# Patient Record
Sex: Female | Born: 1940 | Race: White | Hispanic: No | Marital: Married | Smoking: Never smoker
Health system: Southern US, Community
[De-identification: ages and names within clinical notes are randomized; demographics above are authoritative.]

## PROBLEM LIST (undated history)

## (undated) DIAGNOSIS — K5790 Diverticulosis of intestine, part unspecified, without perforation or abscess without bleeding: Secondary | ICD-10-CM

## (undated) DIAGNOSIS — E785 Hyperlipidemia, unspecified: Secondary | ICD-10-CM

## (undated) DIAGNOSIS — I10 Essential (primary) hypertension: Secondary | ICD-10-CM

## (undated) DIAGNOSIS — E119 Type 2 diabetes mellitus without complications: Secondary | ICD-10-CM

## (undated) DIAGNOSIS — T8859XA Other complications of anesthesia, initial encounter: Secondary | ICD-10-CM

## (undated) DIAGNOSIS — D649 Anemia, unspecified: Secondary | ICD-10-CM

## (undated) DIAGNOSIS — I2699 Other pulmonary embolism without acute cor pulmonale: Secondary | ICD-10-CM

## (undated) DIAGNOSIS — I700 Atherosclerosis of aorta: Secondary | ICD-10-CM

## (undated) DIAGNOSIS — U071 COVID-19: Secondary | ICD-10-CM

## (undated) DIAGNOSIS — K219 Gastro-esophageal reflux disease without esophagitis: Secondary | ICD-10-CM

## (undated) HISTORY — DX: Other pulmonary embolism without acute cor pulmonale: I26.99

## (undated) HISTORY — DX: Atherosclerosis of aorta: I70.0

## (undated) HISTORY — DX: Gastro-esophageal reflux disease without esophagitis: K21.9

## (undated) HISTORY — DX: COVID-19: U07.1

## (undated) HISTORY — DX: Diverticulosis of intestine, part unspecified, without perforation or abscess without bleeding: K57.90

## (undated) HISTORY — PX: COLONOSCOPY: SHX174

## (undated) HISTORY — DX: Hyperlipidemia, unspecified: E78.5

## (undated) HISTORY — PX: TONSILLECTOMY: SUR1361

---

## 2008-08-19 ENCOUNTER — Emergency Department (HOSPITAL_COMMUNITY): Admission: EM | Admit: 2008-08-19 | Discharge: 2008-08-19 | Payer: Self-pay | Admitting: Emergency Medicine

## 2010-10-11 LAB — CBC
HCT: 43.1 % (ref 36.0–46.0)
Hemoglobin: 14.7 g/dL (ref 12.0–15.0)
MCV: 87.1 fL (ref 78.0–100.0)
RBC: 4.94 MIL/uL (ref 3.87–5.11)
WBC: 10.8 10*3/uL — ABNORMAL HIGH (ref 4.0–10.5)

## 2010-10-11 LAB — URINALYSIS, ROUTINE W REFLEX MICROSCOPIC
Bilirubin Urine: NEGATIVE
Hgb urine dipstick: NEGATIVE
Ketones, ur: NEGATIVE mg/dL
Protein, ur: NEGATIVE mg/dL
Urobilinogen, UA: 0.2 mg/dL (ref 0.0–1.0)

## 2010-10-11 LAB — POCT I-STAT, CHEM 8
BUN: 20 mg/dL (ref 6–23)
Creatinine, Ser: 0.9 mg/dL (ref 0.4–1.2)
Sodium: 141 mEq/L (ref 135–145)
TCO2: 25 mmol/L (ref 0–100)

## 2010-10-11 LAB — DIFFERENTIAL
Eosinophils Absolute: 0.3 10*3/uL (ref 0.0–0.7)
Eosinophils Relative: 3 % (ref 0–5)
Lymphs Abs: 4.2 10*3/uL — ABNORMAL HIGH (ref 0.7–4.0)
Monocytes Relative: 10 % (ref 3–12)
Neutrophils Relative %: 49 % (ref 43–77)

## 2010-10-11 LAB — POCT CARDIAC MARKERS: Myoglobin, poc: 76.9 ng/mL (ref 12–200)

## 2014-07-29 DIAGNOSIS — Z1231 Encounter for screening mammogram for malignant neoplasm of breast: Secondary | ICD-10-CM | POA: Diagnosis not present

## 2014-08-10 DIAGNOSIS — E119 Type 2 diabetes mellitus without complications: Secondary | ICD-10-CM | POA: Diagnosis not present

## 2014-08-10 DIAGNOSIS — E782 Mixed hyperlipidemia: Secondary | ICD-10-CM | POA: Diagnosis not present

## 2014-08-10 DIAGNOSIS — E559 Vitamin D deficiency, unspecified: Secondary | ICD-10-CM | POA: Diagnosis not present

## 2014-08-18 DIAGNOSIS — E119 Type 2 diabetes mellitus without complications: Secondary | ICD-10-CM | POA: Diagnosis not present

## 2014-08-18 DIAGNOSIS — Z23 Encounter for immunization: Secondary | ICD-10-CM | POA: Diagnosis not present

## 2014-09-01 DIAGNOSIS — H25813 Combined forms of age-related cataract, bilateral: Secondary | ICD-10-CM | POA: Diagnosis not present

## 2014-09-01 DIAGNOSIS — H3531 Nonexudative age-related macular degeneration: Secondary | ICD-10-CM | POA: Diagnosis not present

## 2014-09-01 DIAGNOSIS — H35363 Drusen (degenerative) of macula, bilateral: Secondary | ICD-10-CM | POA: Diagnosis not present

## 2014-09-17 DIAGNOSIS — J0100 Acute maxillary sinusitis, unspecified: Secondary | ICD-10-CM | POA: Diagnosis not present

## 2014-11-26 DIAGNOSIS — M25552 Pain in left hip: Secondary | ICD-10-CM | POA: Diagnosis not present

## 2014-11-27 DIAGNOSIS — E780 Pure hypercholesterolemia: Secondary | ICD-10-CM | POA: Diagnosis not present

## 2014-11-27 DIAGNOSIS — K5790 Diverticulosis of intestine, part unspecified, without perforation or abscess without bleeding: Secondary | ICD-10-CM | POA: Diagnosis not present

## 2014-11-27 DIAGNOSIS — R079 Chest pain, unspecified: Secondary | ICD-10-CM | POA: Diagnosis not present

## 2014-11-27 DIAGNOSIS — I10 Essential (primary) hypertension: Secondary | ICD-10-CM | POA: Diagnosis not present

## 2014-12-14 DIAGNOSIS — M25552 Pain in left hip: Secondary | ICD-10-CM | POA: Diagnosis not present

## 2014-12-14 DIAGNOSIS — M5416 Radiculopathy, lumbar region: Secondary | ICD-10-CM | POA: Diagnosis not present

## 2014-12-22 DIAGNOSIS — R262 Difficulty in walking, not elsewhere classified: Secondary | ICD-10-CM | POA: Diagnosis not present

## 2014-12-22 DIAGNOSIS — M545 Low back pain: Secondary | ICD-10-CM | POA: Diagnosis not present

## 2014-12-25 DIAGNOSIS — R262 Difficulty in walking, not elsewhere classified: Secondary | ICD-10-CM | POA: Diagnosis not present

## 2014-12-25 DIAGNOSIS — M62552 Muscle wasting and atrophy, not elsewhere classified, left thigh: Secondary | ICD-10-CM | POA: Diagnosis not present

## 2014-12-25 DIAGNOSIS — M545 Low back pain: Secondary | ICD-10-CM | POA: Diagnosis not present

## 2014-12-25 DIAGNOSIS — M62551 Muscle wasting and atrophy, not elsewhere classified, right thigh: Secondary | ICD-10-CM | POA: Diagnosis not present

## 2014-12-27 DIAGNOSIS — K5790 Diverticulosis of intestine, part unspecified, without perforation or abscess without bleeding: Secondary | ICD-10-CM | POA: Diagnosis not present

## 2014-12-27 DIAGNOSIS — E569 Vitamin deficiency, unspecified: Secondary | ICD-10-CM | POA: Diagnosis not present

## 2014-12-27 DIAGNOSIS — E780 Pure hypercholesterolemia: Secondary | ICD-10-CM | POA: Diagnosis not present

## 2014-12-27 DIAGNOSIS — R079 Chest pain, unspecified: Secondary | ICD-10-CM | POA: Diagnosis not present

## 2014-12-30 DIAGNOSIS — R262 Difficulty in walking, not elsewhere classified: Secondary | ICD-10-CM | POA: Diagnosis not present

## 2014-12-30 DIAGNOSIS — M62552 Muscle wasting and atrophy, not elsewhere classified, left thigh: Secondary | ICD-10-CM | POA: Diagnosis not present

## 2014-12-30 DIAGNOSIS — M62551 Muscle wasting and atrophy, not elsewhere classified, right thigh: Secondary | ICD-10-CM | POA: Diagnosis not present

## 2014-12-30 DIAGNOSIS — M545 Low back pain: Secondary | ICD-10-CM | POA: Diagnosis not present

## 2015-01-01 DIAGNOSIS — M62552 Muscle wasting and atrophy, not elsewhere classified, left thigh: Secondary | ICD-10-CM | POA: Diagnosis not present

## 2015-01-01 DIAGNOSIS — M545 Low back pain: Secondary | ICD-10-CM | POA: Diagnosis not present

## 2015-01-01 DIAGNOSIS — M62551 Muscle wasting and atrophy, not elsewhere classified, right thigh: Secondary | ICD-10-CM | POA: Diagnosis not present

## 2015-01-01 DIAGNOSIS — R262 Difficulty in walking, not elsewhere classified: Secondary | ICD-10-CM | POA: Diagnosis not present

## 2015-01-04 DIAGNOSIS — M62551 Muscle wasting and atrophy, not elsewhere classified, right thigh: Secondary | ICD-10-CM | POA: Diagnosis not present

## 2015-01-04 DIAGNOSIS — M545 Low back pain: Secondary | ICD-10-CM | POA: Diagnosis not present

## 2015-01-04 DIAGNOSIS — R262 Difficulty in walking, not elsewhere classified: Secondary | ICD-10-CM | POA: Diagnosis not present

## 2015-01-04 DIAGNOSIS — M62552 Muscle wasting and atrophy, not elsewhere classified, left thigh: Secondary | ICD-10-CM | POA: Diagnosis not present

## 2015-01-08 DIAGNOSIS — R262 Difficulty in walking, not elsewhere classified: Secondary | ICD-10-CM | POA: Diagnosis not present

## 2015-01-08 DIAGNOSIS — M545 Low back pain: Secondary | ICD-10-CM | POA: Diagnosis not present

## 2015-01-08 DIAGNOSIS — M62551 Muscle wasting and atrophy, not elsewhere classified, right thigh: Secondary | ICD-10-CM | POA: Diagnosis not present

## 2015-01-08 DIAGNOSIS — M62552 Muscle wasting and atrophy, not elsewhere classified, left thigh: Secondary | ICD-10-CM | POA: Diagnosis not present

## 2015-01-11 DIAGNOSIS — M62551 Muscle wasting and atrophy, not elsewhere classified, right thigh: Secondary | ICD-10-CM | POA: Diagnosis not present

## 2015-01-11 DIAGNOSIS — R262 Difficulty in walking, not elsewhere classified: Secondary | ICD-10-CM | POA: Diagnosis not present

## 2015-01-11 DIAGNOSIS — M62552 Muscle wasting and atrophy, not elsewhere classified, left thigh: Secondary | ICD-10-CM | POA: Diagnosis not present

## 2015-01-11 DIAGNOSIS — M545 Low back pain: Secondary | ICD-10-CM | POA: Diagnosis not present

## 2015-01-13 DIAGNOSIS — M62552 Muscle wasting and atrophy, not elsewhere classified, left thigh: Secondary | ICD-10-CM | POA: Diagnosis not present

## 2015-01-13 DIAGNOSIS — M545 Low back pain: Secondary | ICD-10-CM | POA: Diagnosis not present

## 2015-01-13 DIAGNOSIS — R262 Difficulty in walking, not elsewhere classified: Secondary | ICD-10-CM | POA: Diagnosis not present

## 2015-01-13 DIAGNOSIS — M62551 Muscle wasting and atrophy, not elsewhere classified, right thigh: Secondary | ICD-10-CM | POA: Diagnosis not present

## 2015-01-20 DIAGNOSIS — M545 Low back pain: Secondary | ICD-10-CM | POA: Diagnosis not present

## 2015-01-20 DIAGNOSIS — M62551 Muscle wasting and atrophy, not elsewhere classified, right thigh: Secondary | ICD-10-CM | POA: Diagnosis not present

## 2015-01-20 DIAGNOSIS — M62552 Muscle wasting and atrophy, not elsewhere classified, left thigh: Secondary | ICD-10-CM | POA: Diagnosis not present

## 2015-01-20 DIAGNOSIS — R262 Difficulty in walking, not elsewhere classified: Secondary | ICD-10-CM | POA: Diagnosis not present

## 2015-01-27 DIAGNOSIS — R262 Difficulty in walking, not elsewhere classified: Secondary | ICD-10-CM | POA: Diagnosis not present

## 2015-01-27 DIAGNOSIS — M62551 Muscle wasting and atrophy, not elsewhere classified, right thigh: Secondary | ICD-10-CM | POA: Diagnosis not present

## 2015-01-27 DIAGNOSIS — E569 Vitamin deficiency, unspecified: Secondary | ICD-10-CM | POA: Diagnosis not present

## 2015-01-27 DIAGNOSIS — M62552 Muscle wasting and atrophy, not elsewhere classified, left thigh: Secondary | ICD-10-CM | POA: Diagnosis not present

## 2015-01-27 DIAGNOSIS — E782 Mixed hyperlipidemia: Secondary | ICD-10-CM | POA: Diagnosis not present

## 2015-01-27 DIAGNOSIS — R079 Chest pain, unspecified: Secondary | ICD-10-CM | POA: Diagnosis not present

## 2015-01-27 DIAGNOSIS — M545 Low back pain: Secondary | ICD-10-CM | POA: Diagnosis not present

## 2015-01-27 DIAGNOSIS — K5790 Diverticulosis of intestine, part unspecified, without perforation or abscess without bleeding: Secondary | ICD-10-CM | POA: Diagnosis not present

## 2015-02-10 DIAGNOSIS — M545 Low back pain: Secondary | ICD-10-CM | POA: Diagnosis not present

## 2015-02-10 DIAGNOSIS — E119 Type 2 diabetes mellitus without complications: Secondary | ICD-10-CM | POA: Diagnosis not present

## 2015-02-10 DIAGNOSIS — M62551 Muscle wasting and atrophy, not elsewhere classified, right thigh: Secondary | ICD-10-CM | POA: Diagnosis not present

## 2015-02-10 DIAGNOSIS — M62552 Muscle wasting and atrophy, not elsewhere classified, left thigh: Secondary | ICD-10-CM | POA: Diagnosis not present

## 2015-02-10 DIAGNOSIS — R262 Difficulty in walking, not elsewhere classified: Secondary | ICD-10-CM | POA: Diagnosis not present

## 2015-02-23 DIAGNOSIS — E119 Type 2 diabetes mellitus without complications: Secondary | ICD-10-CM | POA: Diagnosis not present

## 2015-02-23 DIAGNOSIS — Z23 Encounter for immunization: Secondary | ICD-10-CM | POA: Diagnosis not present

## 2015-02-23 DIAGNOSIS — Z1389 Encounter for screening for other disorder: Secondary | ICD-10-CM | POA: Diagnosis not present

## 2015-03-08 DIAGNOSIS — H524 Presbyopia: Secondary | ICD-10-CM | POA: Diagnosis not present

## 2015-03-08 DIAGNOSIS — H52222 Regular astigmatism, left eye: Secondary | ICD-10-CM | POA: Diagnosis not present

## 2015-03-08 DIAGNOSIS — E119 Type 2 diabetes mellitus without complications: Secondary | ICD-10-CM | POA: Diagnosis not present

## 2015-03-08 DIAGNOSIS — H3531 Nonexudative age-related macular degeneration: Secondary | ICD-10-CM | POA: Diagnosis not present

## 2015-03-08 DIAGNOSIS — H25813 Combined forms of age-related cataract, bilateral: Secondary | ICD-10-CM | POA: Diagnosis not present

## 2015-03-08 DIAGNOSIS — I10 Essential (primary) hypertension: Secondary | ICD-10-CM | POA: Diagnosis not present

## 2015-03-08 DIAGNOSIS — H5211 Myopia, right eye: Secondary | ICD-10-CM | POA: Diagnosis not present

## 2015-04-29 DIAGNOSIS — K5790 Diverticulosis of intestine, part unspecified, without perforation or abscess without bleeding: Secondary | ICD-10-CM | POA: Diagnosis not present

## 2015-04-29 DIAGNOSIS — R079 Chest pain, unspecified: Secondary | ICD-10-CM | POA: Diagnosis not present

## 2015-04-29 DIAGNOSIS — E569 Vitamin deficiency, unspecified: Secondary | ICD-10-CM | POA: Diagnosis not present

## 2015-04-29 DIAGNOSIS — E785 Hyperlipidemia, unspecified: Secondary | ICD-10-CM | POA: Diagnosis not present

## 2015-05-29 DIAGNOSIS — E569 Vitamin deficiency, unspecified: Secondary | ICD-10-CM | POA: Diagnosis not present

## 2015-05-29 DIAGNOSIS — R079 Chest pain, unspecified: Secondary | ICD-10-CM | POA: Diagnosis not present

## 2015-05-29 DIAGNOSIS — E785 Hyperlipidemia, unspecified: Secondary | ICD-10-CM | POA: Diagnosis not present

## 2015-05-29 DIAGNOSIS — K5790 Diverticulosis of intestine, part unspecified, without perforation or abscess without bleeding: Secondary | ICD-10-CM | POA: Diagnosis not present

## 2015-07-01 DIAGNOSIS — I10 Essential (primary) hypertension: Secondary | ICD-10-CM | POA: Diagnosis not present

## 2015-07-01 DIAGNOSIS — Z1389 Encounter for screening for other disorder: Secondary | ICD-10-CM | POA: Diagnosis not present

## 2015-09-21 DIAGNOSIS — M67431 Ganglion, right wrist: Secondary | ICD-10-CM | POA: Diagnosis not present

## 2015-09-21 DIAGNOSIS — Z1389 Encounter for screening for other disorder: Secondary | ICD-10-CM | POA: Diagnosis not present

## 2015-09-21 DIAGNOSIS — E119 Type 2 diabetes mellitus without complications: Secondary | ICD-10-CM | POA: Diagnosis not present

## 2015-09-21 DIAGNOSIS — Z760 Encounter for issue of repeat prescription: Secondary | ICD-10-CM | POA: Diagnosis not present

## 2015-09-21 DIAGNOSIS — Z0000 Encounter for general adult medical examination without abnormal findings: Secondary | ICD-10-CM | POA: Diagnosis not present

## 2015-09-28 DIAGNOSIS — K5732 Diverticulitis of large intestine without perforation or abscess without bleeding: Secondary | ICD-10-CM | POA: Diagnosis not present

## 2015-09-28 DIAGNOSIS — R300 Dysuria: Secondary | ICD-10-CM | POA: Diagnosis not present

## 2016-02-21 DIAGNOSIS — M25569 Pain in unspecified knee: Secondary | ICD-10-CM | POA: Diagnosis not present

## 2016-02-21 DIAGNOSIS — Z9181 History of falling: Secondary | ICD-10-CM | POA: Diagnosis not present

## 2016-02-21 DIAGNOSIS — M25561 Pain in right knee: Secondary | ICD-10-CM | POA: Diagnosis not present

## 2016-03-06 DIAGNOSIS — M25569 Pain in unspecified knee: Secondary | ICD-10-CM | POA: Diagnosis not present

## 2016-03-16 DIAGNOSIS — E119 Type 2 diabetes mellitus without complications: Secondary | ICD-10-CM | POA: Diagnosis not present

## 2016-03-23 DIAGNOSIS — E119 Type 2 diabetes mellitus without complications: Secondary | ICD-10-CM | POA: Diagnosis not present

## 2016-03-23 DIAGNOSIS — Z23 Encounter for immunization: Secondary | ICD-10-CM | POA: Diagnosis not present

## 2016-03-28 DIAGNOSIS — S39012A Strain of muscle, fascia and tendon of lower back, initial encounter: Secondary | ICD-10-CM | POA: Diagnosis not present

## 2016-03-28 DIAGNOSIS — M9901 Segmental and somatic dysfunction of cervical region: Secondary | ICD-10-CM | POA: Diagnosis not present

## 2016-03-28 DIAGNOSIS — M5032 Other cervical disc degeneration, mid-cervical region, unspecified level: Secondary | ICD-10-CM | POA: Diagnosis not present

## 2016-03-28 DIAGNOSIS — S161XXA Strain of muscle, fascia and tendon at neck level, initial encounter: Secondary | ICD-10-CM | POA: Diagnosis not present

## 2016-03-28 DIAGNOSIS — M9903 Segmental and somatic dysfunction of lumbar region: Secondary | ICD-10-CM | POA: Diagnosis not present

## 2016-03-28 DIAGNOSIS — M9902 Segmental and somatic dysfunction of thoracic region: Secondary | ICD-10-CM | POA: Diagnosis not present

## 2016-03-28 DIAGNOSIS — M5136 Other intervertebral disc degeneration, lumbar region: Secondary | ICD-10-CM | POA: Diagnosis not present

## 2016-03-28 DIAGNOSIS — M25561 Pain in right knee: Secondary | ICD-10-CM | POA: Diagnosis not present

## 2016-03-28 DIAGNOSIS — S29019A Strain of muscle and tendon of unspecified wall of thorax, initial encounter: Secondary | ICD-10-CM | POA: Diagnosis not present

## 2016-03-30 DIAGNOSIS — S29019A Strain of muscle and tendon of unspecified wall of thorax, initial encounter: Secondary | ICD-10-CM | POA: Diagnosis not present

## 2016-03-30 DIAGNOSIS — M9901 Segmental and somatic dysfunction of cervical region: Secondary | ICD-10-CM | POA: Diagnosis not present

## 2016-03-30 DIAGNOSIS — S39012A Strain of muscle, fascia and tendon of lower back, initial encounter: Secondary | ICD-10-CM | POA: Diagnosis not present

## 2016-03-30 DIAGNOSIS — M25561 Pain in right knee: Secondary | ICD-10-CM | POA: Diagnosis not present

## 2016-03-30 DIAGNOSIS — M5032 Other cervical disc degeneration, mid-cervical region, unspecified level: Secondary | ICD-10-CM | POA: Diagnosis not present

## 2016-03-30 DIAGNOSIS — S161XXA Strain of muscle, fascia and tendon at neck level, initial encounter: Secondary | ICD-10-CM | POA: Diagnosis not present

## 2016-03-30 DIAGNOSIS — M9902 Segmental and somatic dysfunction of thoracic region: Secondary | ICD-10-CM | POA: Diagnosis not present

## 2016-03-30 DIAGNOSIS — M5136 Other intervertebral disc degeneration, lumbar region: Secondary | ICD-10-CM | POA: Diagnosis not present

## 2016-03-30 DIAGNOSIS — M9903 Segmental and somatic dysfunction of lumbar region: Secondary | ICD-10-CM | POA: Diagnosis not present

## 2016-04-04 DIAGNOSIS — M9903 Segmental and somatic dysfunction of lumbar region: Secondary | ICD-10-CM | POA: Diagnosis not present

## 2016-04-04 DIAGNOSIS — M5032 Other cervical disc degeneration, mid-cervical region, unspecified level: Secondary | ICD-10-CM | POA: Diagnosis not present

## 2016-04-04 DIAGNOSIS — S161XXA Strain of muscle, fascia and tendon at neck level, initial encounter: Secondary | ICD-10-CM | POA: Diagnosis not present

## 2016-04-04 DIAGNOSIS — M9902 Segmental and somatic dysfunction of thoracic region: Secondary | ICD-10-CM | POA: Diagnosis not present

## 2016-04-04 DIAGNOSIS — M9901 Segmental and somatic dysfunction of cervical region: Secondary | ICD-10-CM | POA: Diagnosis not present

## 2016-04-04 DIAGNOSIS — M25561 Pain in right knee: Secondary | ICD-10-CM | POA: Diagnosis not present

## 2016-04-04 DIAGNOSIS — S39012A Strain of muscle, fascia and tendon of lower back, initial encounter: Secondary | ICD-10-CM | POA: Diagnosis not present

## 2016-04-04 DIAGNOSIS — M5136 Other intervertebral disc degeneration, lumbar region: Secondary | ICD-10-CM | POA: Diagnosis not present

## 2016-04-04 DIAGNOSIS — S29019A Strain of muscle and tendon of unspecified wall of thorax, initial encounter: Secondary | ICD-10-CM | POA: Diagnosis not present

## 2016-04-12 DIAGNOSIS — S29019A Strain of muscle and tendon of unspecified wall of thorax, initial encounter: Secondary | ICD-10-CM | POA: Diagnosis not present

## 2016-04-12 DIAGNOSIS — M5136 Other intervertebral disc degeneration, lumbar region: Secondary | ICD-10-CM | POA: Diagnosis not present

## 2016-04-12 DIAGNOSIS — S161XXA Strain of muscle, fascia and tendon at neck level, initial encounter: Secondary | ICD-10-CM | POA: Diagnosis not present

## 2016-04-12 DIAGNOSIS — S39012A Strain of muscle, fascia and tendon of lower back, initial encounter: Secondary | ICD-10-CM | POA: Diagnosis not present

## 2016-04-12 DIAGNOSIS — M5032 Other cervical disc degeneration, mid-cervical region, unspecified level: Secondary | ICD-10-CM | POA: Diagnosis not present

## 2016-04-12 DIAGNOSIS — M25561 Pain in right knee: Secondary | ICD-10-CM | POA: Diagnosis not present

## 2016-04-12 DIAGNOSIS — M9901 Segmental and somatic dysfunction of cervical region: Secondary | ICD-10-CM | POA: Diagnosis not present

## 2016-04-12 DIAGNOSIS — M9903 Segmental and somatic dysfunction of lumbar region: Secondary | ICD-10-CM | POA: Diagnosis not present

## 2016-04-12 DIAGNOSIS — M9902 Segmental and somatic dysfunction of thoracic region: Secondary | ICD-10-CM | POA: Diagnosis not present

## 2016-04-18 DIAGNOSIS — H3530 Unspecified macular degeneration: Secondary | ICD-10-CM | POA: Diagnosis not present

## 2016-04-18 DIAGNOSIS — H52222 Regular astigmatism, left eye: Secondary | ICD-10-CM | POA: Diagnosis not present

## 2016-04-18 DIAGNOSIS — H524 Presbyopia: Secondary | ICD-10-CM | POA: Diagnosis not present

## 2016-04-18 DIAGNOSIS — H25813 Combined forms of age-related cataract, bilateral: Secondary | ICD-10-CM | POA: Diagnosis not present

## 2016-04-18 DIAGNOSIS — H5211 Myopia, right eye: Secondary | ICD-10-CM | POA: Diagnosis not present

## 2016-04-18 DIAGNOSIS — E119 Type 2 diabetes mellitus without complications: Secondary | ICD-10-CM | POA: Diagnosis not present

## 2016-04-18 DIAGNOSIS — H353131 Nonexudative age-related macular degeneration, bilateral, early dry stage: Secondary | ICD-10-CM | POA: Diagnosis not present

## 2016-04-26 DIAGNOSIS — S29019A Strain of muscle and tendon of unspecified wall of thorax, initial encounter: Secondary | ICD-10-CM | POA: Diagnosis not present

## 2016-04-26 DIAGNOSIS — S39012A Strain of muscle, fascia and tendon of lower back, initial encounter: Secondary | ICD-10-CM | POA: Diagnosis not present

## 2016-04-26 DIAGNOSIS — M9903 Segmental and somatic dysfunction of lumbar region: Secondary | ICD-10-CM | POA: Diagnosis not present

## 2016-04-26 DIAGNOSIS — S161XXA Strain of muscle, fascia and tendon at neck level, initial encounter: Secondary | ICD-10-CM | POA: Diagnosis not present

## 2016-04-26 DIAGNOSIS — M5032 Other cervical disc degeneration, mid-cervical region, unspecified level: Secondary | ICD-10-CM | POA: Diagnosis not present

## 2016-04-26 DIAGNOSIS — M5136 Other intervertebral disc degeneration, lumbar region: Secondary | ICD-10-CM | POA: Diagnosis not present

## 2016-04-26 DIAGNOSIS — M9901 Segmental and somatic dysfunction of cervical region: Secondary | ICD-10-CM | POA: Diagnosis not present

## 2016-04-26 DIAGNOSIS — M25561 Pain in right knee: Secondary | ICD-10-CM | POA: Diagnosis not present

## 2016-04-26 DIAGNOSIS — M9902 Segmental and somatic dysfunction of thoracic region: Secondary | ICD-10-CM | POA: Diagnosis not present

## 2016-05-26 DIAGNOSIS — J00 Acute nasopharyngitis [common cold]: Secondary | ICD-10-CM | POA: Diagnosis not present

## 2016-06-12 DIAGNOSIS — E119 Type 2 diabetes mellitus without complications: Secondary | ICD-10-CM | POA: Diagnosis not present

## 2016-06-21 DIAGNOSIS — E119 Type 2 diabetes mellitus without complications: Secondary | ICD-10-CM | POA: Diagnosis not present

## 2016-09-19 DIAGNOSIS — D485 Neoplasm of uncertain behavior of skin: Secondary | ICD-10-CM | POA: Diagnosis not present

## 2016-09-19 DIAGNOSIS — L578 Other skin changes due to chronic exposure to nonionizing radiation: Secondary | ICD-10-CM | POA: Diagnosis not present

## 2016-09-19 DIAGNOSIS — L821 Other seborrheic keratosis: Secondary | ICD-10-CM | POA: Diagnosis not present

## 2016-11-09 DIAGNOSIS — H25813 Combined forms of age-related cataract, bilateral: Secondary | ICD-10-CM | POA: Diagnosis not present

## 2016-11-09 DIAGNOSIS — I10 Essential (primary) hypertension: Secondary | ICD-10-CM | POA: Diagnosis not present

## 2016-12-11 DIAGNOSIS — E119 Type 2 diabetes mellitus without complications: Secondary | ICD-10-CM | POA: Diagnosis not present

## 2016-12-18 DIAGNOSIS — E1165 Type 2 diabetes mellitus with hyperglycemia: Secondary | ICD-10-CM | POA: Diagnosis not present

## 2016-12-18 DIAGNOSIS — Z6838 Body mass index (BMI) 38.0-38.9, adult: Secondary | ICD-10-CM | POA: Diagnosis not present

## 2017-01-04 DIAGNOSIS — M7052 Other bursitis of knee, left knee: Secondary | ICD-10-CM | POA: Diagnosis not present

## 2017-01-18 DIAGNOSIS — M25569 Pain in unspecified knee: Secondary | ICD-10-CM | POA: Diagnosis not present

## 2017-05-14 DIAGNOSIS — E119 Type 2 diabetes mellitus without complications: Secondary | ICD-10-CM | POA: Diagnosis not present

## 2017-05-14 DIAGNOSIS — H52223 Regular astigmatism, bilateral: Secondary | ICD-10-CM | POA: Diagnosis not present

## 2017-05-14 DIAGNOSIS — H524 Presbyopia: Secondary | ICD-10-CM | POA: Diagnosis not present

## 2017-05-14 DIAGNOSIS — H5213 Myopia, bilateral: Secondary | ICD-10-CM | POA: Diagnosis not present

## 2017-05-14 DIAGNOSIS — Z7984 Long term (current) use of oral hypoglycemic drugs: Secondary | ICD-10-CM | POA: Diagnosis not present

## 2017-05-14 DIAGNOSIS — I10 Essential (primary) hypertension: Secondary | ICD-10-CM | POA: Diagnosis not present

## 2017-05-14 DIAGNOSIS — H25813 Combined forms of age-related cataract, bilateral: Secondary | ICD-10-CM | POA: Diagnosis not present

## 2017-06-06 DIAGNOSIS — R635 Abnormal weight gain: Secondary | ICD-10-CM | POA: Diagnosis not present

## 2017-06-06 DIAGNOSIS — E1165 Type 2 diabetes mellitus with hyperglycemia: Secondary | ICD-10-CM | POA: Diagnosis not present

## 2017-06-13 DIAGNOSIS — Z9181 History of falling: Secondary | ICD-10-CM | POA: Diagnosis not present

## 2017-06-13 DIAGNOSIS — Z1339 Encounter for screening examination for other mental health and behavioral disorders: Secondary | ICD-10-CM | POA: Diagnosis not present

## 2017-06-13 DIAGNOSIS — M7989 Other specified soft tissue disorders: Secondary | ICD-10-CM | POA: Diagnosis not present

## 2017-06-13 DIAGNOSIS — Z23 Encounter for immunization: Secondary | ICD-10-CM | POA: Diagnosis not present

## 2017-06-13 DIAGNOSIS — M79662 Pain in left lower leg: Secondary | ICD-10-CM | POA: Diagnosis not present

## 2017-06-13 DIAGNOSIS — E1165 Type 2 diabetes mellitus with hyperglycemia: Secondary | ICD-10-CM | POA: Diagnosis not present

## 2017-06-13 DIAGNOSIS — I8002 Phlebitis and thrombophlebitis of superficial vessels of left lower extremity: Secondary | ICD-10-CM | POA: Diagnosis not present

## 2017-06-13 DIAGNOSIS — R609 Edema, unspecified: Secondary | ICD-10-CM | POA: Diagnosis not present

## 2017-06-13 DIAGNOSIS — Z6839 Body mass index (BMI) 39.0-39.9, adult: Secondary | ICD-10-CM | POA: Diagnosis not present

## 2017-06-13 DIAGNOSIS — Z1331 Encounter for screening for depression: Secondary | ICD-10-CM | POA: Diagnosis not present

## 2017-07-12 DIAGNOSIS — M5136 Other intervertebral disc degeneration, lumbar region: Secondary | ICD-10-CM | POA: Diagnosis not present

## 2017-07-12 DIAGNOSIS — M9901 Segmental and somatic dysfunction of cervical region: Secondary | ICD-10-CM | POA: Diagnosis not present

## 2017-07-12 DIAGNOSIS — S39012A Strain of muscle, fascia and tendon of lower back, initial encounter: Secondary | ICD-10-CM | POA: Diagnosis not present

## 2017-07-12 DIAGNOSIS — M9902 Segmental and somatic dysfunction of thoracic region: Secondary | ICD-10-CM | POA: Diagnosis not present

## 2017-07-12 DIAGNOSIS — M25561 Pain in right knee: Secondary | ICD-10-CM | POA: Diagnosis not present

## 2017-07-12 DIAGNOSIS — M9903 Segmental and somatic dysfunction of lumbar region: Secondary | ICD-10-CM | POA: Diagnosis not present

## 2017-07-12 DIAGNOSIS — S29019A Strain of muscle and tendon of unspecified wall of thorax, initial encounter: Secondary | ICD-10-CM | POA: Diagnosis not present

## 2017-07-12 DIAGNOSIS — S161XXA Strain of muscle, fascia and tendon at neck level, initial encounter: Secondary | ICD-10-CM | POA: Diagnosis not present

## 2017-07-12 DIAGNOSIS — M5032 Other cervical disc degeneration, mid-cervical region, unspecified level: Secondary | ICD-10-CM | POA: Diagnosis not present

## 2017-07-16 DIAGNOSIS — S161XXA Strain of muscle, fascia and tendon at neck level, initial encounter: Secondary | ICD-10-CM | POA: Diagnosis not present

## 2017-07-16 DIAGNOSIS — S39012A Strain of muscle, fascia and tendon of lower back, initial encounter: Secondary | ICD-10-CM | POA: Diagnosis not present

## 2017-07-16 DIAGNOSIS — S29019A Strain of muscle and tendon of unspecified wall of thorax, initial encounter: Secondary | ICD-10-CM | POA: Diagnosis not present

## 2017-07-16 DIAGNOSIS — E1165 Type 2 diabetes mellitus with hyperglycemia: Secondary | ICD-10-CM | POA: Diagnosis not present

## 2017-07-16 DIAGNOSIS — M9902 Segmental and somatic dysfunction of thoracic region: Secondary | ICD-10-CM | POA: Diagnosis not present

## 2017-07-16 DIAGNOSIS — M5136 Other intervertebral disc degeneration, lumbar region: Secondary | ICD-10-CM | POA: Diagnosis not present

## 2017-07-16 DIAGNOSIS — M5032 Other cervical disc degeneration, mid-cervical region, unspecified level: Secondary | ICD-10-CM | POA: Diagnosis not present

## 2017-07-16 DIAGNOSIS — M9903 Segmental and somatic dysfunction of lumbar region: Secondary | ICD-10-CM | POA: Diagnosis not present

## 2017-07-16 DIAGNOSIS — M9901 Segmental and somatic dysfunction of cervical region: Secondary | ICD-10-CM | POA: Diagnosis not present

## 2017-07-16 DIAGNOSIS — M25561 Pain in right knee: Secondary | ICD-10-CM | POA: Diagnosis not present

## 2017-07-18 DIAGNOSIS — M9903 Segmental and somatic dysfunction of lumbar region: Secondary | ICD-10-CM | POA: Diagnosis not present

## 2017-07-18 DIAGNOSIS — M9902 Segmental and somatic dysfunction of thoracic region: Secondary | ICD-10-CM | POA: Diagnosis not present

## 2017-07-18 DIAGNOSIS — S29019A Strain of muscle and tendon of unspecified wall of thorax, initial encounter: Secondary | ICD-10-CM | POA: Diagnosis not present

## 2017-07-18 DIAGNOSIS — S161XXA Strain of muscle, fascia and tendon at neck level, initial encounter: Secondary | ICD-10-CM | POA: Diagnosis not present

## 2017-07-18 DIAGNOSIS — M25561 Pain in right knee: Secondary | ICD-10-CM | POA: Diagnosis not present

## 2017-07-18 DIAGNOSIS — M5032 Other cervical disc degeneration, mid-cervical region, unspecified level: Secondary | ICD-10-CM | POA: Diagnosis not present

## 2017-07-18 DIAGNOSIS — M9901 Segmental and somatic dysfunction of cervical region: Secondary | ICD-10-CM | POA: Diagnosis not present

## 2017-07-18 DIAGNOSIS — M5136 Other intervertebral disc degeneration, lumbar region: Secondary | ICD-10-CM | POA: Diagnosis not present

## 2017-07-18 DIAGNOSIS — S39012A Strain of muscle, fascia and tendon of lower back, initial encounter: Secondary | ICD-10-CM | POA: Diagnosis not present

## 2017-07-19 DIAGNOSIS — M9903 Segmental and somatic dysfunction of lumbar region: Secondary | ICD-10-CM | POA: Diagnosis not present

## 2017-07-19 DIAGNOSIS — M5032 Other cervical disc degeneration, mid-cervical region, unspecified level: Secondary | ICD-10-CM | POA: Diagnosis not present

## 2017-07-19 DIAGNOSIS — S29019A Strain of muscle and tendon of unspecified wall of thorax, initial encounter: Secondary | ICD-10-CM | POA: Diagnosis not present

## 2017-07-19 DIAGNOSIS — S39012A Strain of muscle, fascia and tendon of lower back, initial encounter: Secondary | ICD-10-CM | POA: Diagnosis not present

## 2017-07-19 DIAGNOSIS — M25561 Pain in right knee: Secondary | ICD-10-CM | POA: Diagnosis not present

## 2017-07-19 DIAGNOSIS — M9901 Segmental and somatic dysfunction of cervical region: Secondary | ICD-10-CM | POA: Diagnosis not present

## 2017-07-19 DIAGNOSIS — M5136 Other intervertebral disc degeneration, lumbar region: Secondary | ICD-10-CM | POA: Diagnosis not present

## 2017-07-19 DIAGNOSIS — M9902 Segmental and somatic dysfunction of thoracic region: Secondary | ICD-10-CM | POA: Diagnosis not present

## 2017-07-19 DIAGNOSIS — S161XXA Strain of muscle, fascia and tendon at neck level, initial encounter: Secondary | ICD-10-CM | POA: Diagnosis not present

## 2017-07-23 DIAGNOSIS — S29019A Strain of muscle and tendon of unspecified wall of thorax, initial encounter: Secondary | ICD-10-CM | POA: Diagnosis not present

## 2017-07-23 DIAGNOSIS — S39012A Strain of muscle, fascia and tendon of lower back, initial encounter: Secondary | ICD-10-CM | POA: Diagnosis not present

## 2017-07-23 DIAGNOSIS — M9903 Segmental and somatic dysfunction of lumbar region: Secondary | ICD-10-CM | POA: Diagnosis not present

## 2017-07-23 DIAGNOSIS — M5136 Other intervertebral disc degeneration, lumbar region: Secondary | ICD-10-CM | POA: Diagnosis not present

## 2017-07-23 DIAGNOSIS — M5032 Other cervical disc degeneration, mid-cervical region, unspecified level: Secondary | ICD-10-CM | POA: Diagnosis not present

## 2017-07-23 DIAGNOSIS — M9902 Segmental and somatic dysfunction of thoracic region: Secondary | ICD-10-CM | POA: Diagnosis not present

## 2017-07-23 DIAGNOSIS — M25561 Pain in right knee: Secondary | ICD-10-CM | POA: Diagnosis not present

## 2017-07-23 DIAGNOSIS — S161XXA Strain of muscle, fascia and tendon at neck level, initial encounter: Secondary | ICD-10-CM | POA: Diagnosis not present

## 2017-07-23 DIAGNOSIS — M9901 Segmental and somatic dysfunction of cervical region: Secondary | ICD-10-CM | POA: Diagnosis not present

## 2017-07-25 DIAGNOSIS — M25561 Pain in right knee: Secondary | ICD-10-CM | POA: Diagnosis not present

## 2017-07-25 DIAGNOSIS — M9903 Segmental and somatic dysfunction of lumbar region: Secondary | ICD-10-CM | POA: Diagnosis not present

## 2017-07-25 DIAGNOSIS — M5136 Other intervertebral disc degeneration, lumbar region: Secondary | ICD-10-CM | POA: Diagnosis not present

## 2017-07-25 DIAGNOSIS — S161XXA Strain of muscle, fascia and tendon at neck level, initial encounter: Secondary | ICD-10-CM | POA: Diagnosis not present

## 2017-07-25 DIAGNOSIS — M9901 Segmental and somatic dysfunction of cervical region: Secondary | ICD-10-CM | POA: Diagnosis not present

## 2017-07-25 DIAGNOSIS — M9902 Segmental and somatic dysfunction of thoracic region: Secondary | ICD-10-CM | POA: Diagnosis not present

## 2017-07-25 DIAGNOSIS — S39012A Strain of muscle, fascia and tendon of lower back, initial encounter: Secondary | ICD-10-CM | POA: Diagnosis not present

## 2017-07-25 DIAGNOSIS — S29019A Strain of muscle and tendon of unspecified wall of thorax, initial encounter: Secondary | ICD-10-CM | POA: Diagnosis not present

## 2017-07-25 DIAGNOSIS — M5032 Other cervical disc degeneration, mid-cervical region, unspecified level: Secondary | ICD-10-CM | POA: Diagnosis not present

## 2017-07-30 DIAGNOSIS — E1165 Type 2 diabetes mellitus with hyperglycemia: Secondary | ICD-10-CM | POA: Diagnosis not present

## 2017-08-01 DIAGNOSIS — M9903 Segmental and somatic dysfunction of lumbar region: Secondary | ICD-10-CM | POA: Diagnosis not present

## 2017-08-01 DIAGNOSIS — S29019A Strain of muscle and tendon of unspecified wall of thorax, initial encounter: Secondary | ICD-10-CM | POA: Diagnosis not present

## 2017-08-01 DIAGNOSIS — M25561 Pain in right knee: Secondary | ICD-10-CM | POA: Diagnosis not present

## 2017-08-01 DIAGNOSIS — S161XXA Strain of muscle, fascia and tendon at neck level, initial encounter: Secondary | ICD-10-CM | POA: Diagnosis not present

## 2017-08-01 DIAGNOSIS — S39012A Strain of muscle, fascia and tendon of lower back, initial encounter: Secondary | ICD-10-CM | POA: Diagnosis not present

## 2017-08-01 DIAGNOSIS — M5136 Other intervertebral disc degeneration, lumbar region: Secondary | ICD-10-CM | POA: Diagnosis not present

## 2017-08-01 DIAGNOSIS — M9902 Segmental and somatic dysfunction of thoracic region: Secondary | ICD-10-CM | POA: Diagnosis not present

## 2017-08-01 DIAGNOSIS — M9901 Segmental and somatic dysfunction of cervical region: Secondary | ICD-10-CM | POA: Diagnosis not present

## 2017-08-01 DIAGNOSIS — M5032 Other cervical disc degeneration, mid-cervical region, unspecified level: Secondary | ICD-10-CM | POA: Diagnosis not present

## 2017-08-13 DIAGNOSIS — M5032 Other cervical disc degeneration, mid-cervical region, unspecified level: Secondary | ICD-10-CM | POA: Diagnosis not present

## 2017-08-13 DIAGNOSIS — M5136 Other intervertebral disc degeneration, lumbar region: Secondary | ICD-10-CM | POA: Diagnosis not present

## 2017-08-13 DIAGNOSIS — M25561 Pain in right knee: Secondary | ICD-10-CM | POA: Diagnosis not present

## 2017-08-13 DIAGNOSIS — S39012A Strain of muscle, fascia and tendon of lower back, initial encounter: Secondary | ICD-10-CM | POA: Diagnosis not present

## 2017-08-13 DIAGNOSIS — M9902 Segmental and somatic dysfunction of thoracic region: Secondary | ICD-10-CM | POA: Diagnosis not present

## 2017-08-13 DIAGNOSIS — S29019A Strain of muscle and tendon of unspecified wall of thorax, initial encounter: Secondary | ICD-10-CM | POA: Diagnosis not present

## 2017-08-13 DIAGNOSIS — M9901 Segmental and somatic dysfunction of cervical region: Secondary | ICD-10-CM | POA: Diagnosis not present

## 2017-08-13 DIAGNOSIS — M9903 Segmental and somatic dysfunction of lumbar region: Secondary | ICD-10-CM | POA: Diagnosis not present

## 2017-08-13 DIAGNOSIS — S161XXA Strain of muscle, fascia and tendon at neck level, initial encounter: Secondary | ICD-10-CM | POA: Diagnosis not present

## 2017-08-23 DIAGNOSIS — S161XXA Strain of muscle, fascia and tendon at neck level, initial encounter: Secondary | ICD-10-CM | POA: Diagnosis not present

## 2017-08-23 DIAGNOSIS — M9901 Segmental and somatic dysfunction of cervical region: Secondary | ICD-10-CM | POA: Diagnosis not present

## 2017-08-23 DIAGNOSIS — M9902 Segmental and somatic dysfunction of thoracic region: Secondary | ICD-10-CM | POA: Diagnosis not present

## 2017-08-23 DIAGNOSIS — M25561 Pain in right knee: Secondary | ICD-10-CM | POA: Diagnosis not present

## 2017-08-23 DIAGNOSIS — S29019A Strain of muscle and tendon of unspecified wall of thorax, initial encounter: Secondary | ICD-10-CM | POA: Diagnosis not present

## 2017-08-23 DIAGNOSIS — M5136 Other intervertebral disc degeneration, lumbar region: Secondary | ICD-10-CM | POA: Diagnosis not present

## 2017-08-23 DIAGNOSIS — S39012A Strain of muscle, fascia and tendon of lower back, initial encounter: Secondary | ICD-10-CM | POA: Diagnosis not present

## 2017-08-23 DIAGNOSIS — M5032 Other cervical disc degeneration, mid-cervical region, unspecified level: Secondary | ICD-10-CM | POA: Diagnosis not present

## 2017-08-23 DIAGNOSIS — M9903 Segmental and somatic dysfunction of lumbar region: Secondary | ICD-10-CM | POA: Diagnosis not present

## 2017-08-31 DIAGNOSIS — J209 Acute bronchitis, unspecified: Secondary | ICD-10-CM | POA: Diagnosis not present

## 2017-09-07 DIAGNOSIS — R0602 Shortness of breath: Secondary | ICD-10-CM | POA: Diagnosis not present

## 2017-09-19 DIAGNOSIS — K1120 Sialoadenitis, unspecified: Secondary | ICD-10-CM | POA: Diagnosis not present

## 2017-10-15 DIAGNOSIS — E1165 Type 2 diabetes mellitus with hyperglycemia: Secondary | ICD-10-CM | POA: Diagnosis not present

## 2017-10-22 DIAGNOSIS — Z6838 Body mass index (BMI) 38.0-38.9, adult: Secondary | ICD-10-CM | POA: Diagnosis not present

## 2017-10-22 DIAGNOSIS — E118 Type 2 diabetes mellitus with unspecified complications: Secondary | ICD-10-CM | POA: Diagnosis not present

## 2017-11-14 DIAGNOSIS — M9903 Segmental and somatic dysfunction of lumbar region: Secondary | ICD-10-CM | POA: Diagnosis not present

## 2017-11-14 DIAGNOSIS — M5032 Other cervical disc degeneration, mid-cervical region, unspecified level: Secondary | ICD-10-CM | POA: Diagnosis not present

## 2017-11-14 DIAGNOSIS — S29019A Strain of muscle and tendon of unspecified wall of thorax, initial encounter: Secondary | ICD-10-CM | POA: Diagnosis not present

## 2017-11-14 DIAGNOSIS — M25562 Pain in left knee: Secondary | ICD-10-CM | POA: Diagnosis not present

## 2017-11-14 DIAGNOSIS — M9902 Segmental and somatic dysfunction of thoracic region: Secondary | ICD-10-CM | POA: Diagnosis not present

## 2017-11-14 DIAGNOSIS — M5136 Other intervertebral disc degeneration, lumbar region: Secondary | ICD-10-CM | POA: Diagnosis not present

## 2017-11-14 DIAGNOSIS — M9901 Segmental and somatic dysfunction of cervical region: Secondary | ICD-10-CM | POA: Diagnosis not present

## 2017-11-14 DIAGNOSIS — S161XXA Strain of muscle, fascia and tendon at neck level, initial encounter: Secondary | ICD-10-CM | POA: Diagnosis not present

## 2017-11-14 DIAGNOSIS — M25561 Pain in right knee: Secondary | ICD-10-CM | POA: Diagnosis not present

## 2017-11-14 DIAGNOSIS — S39012A Strain of muscle, fascia and tendon of lower back, initial encounter: Secondary | ICD-10-CM | POA: Diagnosis not present

## 2017-11-26 DIAGNOSIS — H25813 Combined forms of age-related cataract, bilateral: Secondary | ICD-10-CM | POA: Diagnosis not present

## 2018-04-15 DIAGNOSIS — E1165 Type 2 diabetes mellitus with hyperglycemia: Secondary | ICD-10-CM | POA: Diagnosis not present

## 2018-04-22 DIAGNOSIS — Z6841 Body Mass Index (BMI) 40.0 and over, adult: Secondary | ICD-10-CM | POA: Diagnosis not present

## 2018-04-22 DIAGNOSIS — E1165 Type 2 diabetes mellitus with hyperglycemia: Secondary | ICD-10-CM | POA: Diagnosis not present

## 2018-04-22 DIAGNOSIS — Z23 Encounter for immunization: Secondary | ICD-10-CM | POA: Diagnosis not present

## 2018-04-22 DIAGNOSIS — Z0000 Encounter for general adult medical examination without abnormal findings: Secondary | ICD-10-CM | POA: Diagnosis not present

## 2018-04-25 DIAGNOSIS — E1165 Type 2 diabetes mellitus with hyperglycemia: Secondary | ICD-10-CM | POA: Diagnosis not present

## 2018-04-25 DIAGNOSIS — E782 Mixed hyperlipidemia: Secondary | ICD-10-CM | POA: Diagnosis not present

## 2018-05-25 DIAGNOSIS — E1165 Type 2 diabetes mellitus with hyperglycemia: Secondary | ICD-10-CM | POA: Diagnosis not present

## 2018-05-25 DIAGNOSIS — F329 Major depressive disorder, single episode, unspecified: Secondary | ICD-10-CM | POA: Diagnosis not present

## 2018-05-25 DIAGNOSIS — E782 Mixed hyperlipidemia: Secondary | ICD-10-CM | POA: Diagnosis not present

## 2018-05-30 DIAGNOSIS — Z7984 Long term (current) use of oral hypoglycemic drugs: Secondary | ICD-10-CM | POA: Diagnosis not present

## 2018-05-30 DIAGNOSIS — E119 Type 2 diabetes mellitus without complications: Secondary | ICD-10-CM | POA: Diagnosis not present

## 2018-05-30 DIAGNOSIS — I10 Essential (primary) hypertension: Secondary | ICD-10-CM | POA: Diagnosis not present

## 2018-05-30 DIAGNOSIS — H52223 Regular astigmatism, bilateral: Secondary | ICD-10-CM | POA: Diagnosis not present

## 2018-05-30 DIAGNOSIS — H25813 Combined forms of age-related cataract, bilateral: Secondary | ICD-10-CM | POA: Diagnosis not present

## 2018-05-30 DIAGNOSIS — H5213 Myopia, bilateral: Secondary | ICD-10-CM | POA: Diagnosis not present

## 2018-05-30 DIAGNOSIS — H524 Presbyopia: Secondary | ICD-10-CM | POA: Diagnosis not present

## 2018-06-25 DIAGNOSIS — E1165 Type 2 diabetes mellitus with hyperglycemia: Secondary | ICD-10-CM | POA: Diagnosis not present

## 2018-06-25 DIAGNOSIS — E785 Hyperlipidemia, unspecified: Secondary | ICD-10-CM | POA: Diagnosis not present

## 2018-06-25 DIAGNOSIS — I10 Essential (primary) hypertension: Secondary | ICD-10-CM | POA: Diagnosis not present

## 2018-07-15 DIAGNOSIS — Z79899 Other long term (current) drug therapy: Secondary | ICD-10-CM | POA: Diagnosis not present

## 2018-07-15 DIAGNOSIS — E1165 Type 2 diabetes mellitus with hyperglycemia: Secondary | ICD-10-CM | POA: Diagnosis not present

## 2018-07-15 DIAGNOSIS — E785 Hyperlipidemia, unspecified: Secondary | ICD-10-CM | POA: Diagnosis not present

## 2018-07-25 DIAGNOSIS — E785 Hyperlipidemia, unspecified: Secondary | ICD-10-CM | POA: Diagnosis not present

## 2018-07-25 DIAGNOSIS — E1165 Type 2 diabetes mellitus with hyperglycemia: Secondary | ICD-10-CM | POA: Diagnosis not present

## 2018-07-25 DIAGNOSIS — I10 Essential (primary) hypertension: Secondary | ICD-10-CM | POA: Diagnosis not present

## 2018-08-29 DIAGNOSIS — E669 Obesity, unspecified: Secondary | ICD-10-CM | POA: Diagnosis not present

## 2018-08-29 DIAGNOSIS — E118 Type 2 diabetes mellitus with unspecified complications: Secondary | ICD-10-CM | POA: Diagnosis not present

## 2018-08-29 DIAGNOSIS — I10 Essential (primary) hypertension: Secondary | ICD-10-CM | POA: Diagnosis not present

## 2018-08-29 DIAGNOSIS — M159 Polyosteoarthritis, unspecified: Secondary | ICD-10-CM | POA: Diagnosis not present

## 2018-09-24 DIAGNOSIS — E1165 Type 2 diabetes mellitus with hyperglycemia: Secondary | ICD-10-CM | POA: Diagnosis not present

## 2018-09-24 DIAGNOSIS — I10 Essential (primary) hypertension: Secondary | ICD-10-CM | POA: Diagnosis not present

## 2019-03-22 DIAGNOSIS — Z23 Encounter for immunization: Secondary | ICD-10-CM | POA: Diagnosis not present

## 2019-04-24 DIAGNOSIS — E7800 Pure hypercholesterolemia, unspecified: Secondary | ICD-10-CM | POA: Diagnosis not present

## 2019-04-24 DIAGNOSIS — I10 Essential (primary) hypertension: Secondary | ICD-10-CM | POA: Diagnosis not present

## 2019-04-24 DIAGNOSIS — M8589 Other specified disorders of bone density and structure, multiple sites: Secondary | ICD-10-CM | POA: Diagnosis not present

## 2019-04-24 DIAGNOSIS — Z0000 Encounter for general adult medical examination without abnormal findings: Secondary | ICD-10-CM | POA: Diagnosis not present

## 2019-04-24 DIAGNOSIS — M159 Polyosteoarthritis, unspecified: Secondary | ICD-10-CM | POA: Diagnosis not present

## 2019-04-24 DIAGNOSIS — E1122 Type 2 diabetes mellitus with diabetic chronic kidney disease: Secondary | ICD-10-CM | POA: Diagnosis not present

## 2019-04-24 DIAGNOSIS — N183 Chronic kidney disease, stage 3 unspecified: Secondary | ICD-10-CM | POA: Diagnosis not present

## 2019-04-24 DIAGNOSIS — Z780 Asymptomatic menopausal state: Secondary | ICD-10-CM | POA: Diagnosis not present

## 2019-04-24 DIAGNOSIS — E559 Vitamin D deficiency, unspecified: Secondary | ICD-10-CM | POA: Diagnosis not present

## 2019-09-24 DIAGNOSIS — E1165 Type 2 diabetes mellitus with hyperglycemia: Secondary | ICD-10-CM | POA: Diagnosis not present

## 2019-09-24 DIAGNOSIS — I10 Essential (primary) hypertension: Secondary | ICD-10-CM | POA: Diagnosis not present

## 2019-09-29 DIAGNOSIS — E1165 Type 2 diabetes mellitus with hyperglycemia: Secondary | ICD-10-CM | POA: Diagnosis not present

## 2019-09-29 DIAGNOSIS — Z79899 Other long term (current) drug therapy: Secondary | ICD-10-CM | POA: Diagnosis not present

## 2019-09-29 DIAGNOSIS — E7800 Pure hypercholesterolemia, unspecified: Secondary | ICD-10-CM | POA: Diagnosis not present

## 2019-10-03 DIAGNOSIS — E669 Obesity, unspecified: Secondary | ICD-10-CM | POA: Diagnosis not present

## 2019-10-03 DIAGNOSIS — E1165 Type 2 diabetes mellitus with hyperglycemia: Secondary | ICD-10-CM | POA: Diagnosis not present

## 2019-10-03 DIAGNOSIS — G4700 Insomnia, unspecified: Secondary | ICD-10-CM | POA: Diagnosis not present

## 2019-10-03 DIAGNOSIS — K529 Noninfective gastroenteritis and colitis, unspecified: Secondary | ICD-10-CM | POA: Diagnosis not present

## 2019-10-09 DIAGNOSIS — R413 Other amnesia: Secondary | ICD-10-CM | POA: Diagnosis not present

## 2019-10-09 DIAGNOSIS — R4182 Altered mental status, unspecified: Secondary | ICD-10-CM | POA: Diagnosis not present

## 2019-10-09 DIAGNOSIS — Z6838 Body mass index (BMI) 38.0-38.9, adult: Secondary | ICD-10-CM | POA: Diagnosis not present

## 2019-10-09 DIAGNOSIS — N3001 Acute cystitis with hematuria: Secondary | ICD-10-CM | POA: Diagnosis not present

## 2019-10-09 DIAGNOSIS — R350 Frequency of micturition: Secondary | ICD-10-CM | POA: Diagnosis not present

## 2019-10-09 DIAGNOSIS — K5732 Diverticulitis of large intestine without perforation or abscess without bleeding: Secondary | ICD-10-CM | POA: Diagnosis not present

## 2019-10-22 DIAGNOSIS — L304 Erythema intertrigo: Secondary | ICD-10-CM | POA: Diagnosis not present

## 2019-10-22 DIAGNOSIS — Z6838 Body mass index (BMI) 38.0-38.9, adult: Secondary | ICD-10-CM | POA: Diagnosis not present

## 2019-10-28 DIAGNOSIS — R5383 Other fatigue: Secondary | ICD-10-CM | POA: Diagnosis not present

## 2019-10-28 DIAGNOSIS — R5381 Other malaise: Secondary | ICD-10-CM | POA: Diagnosis not present

## 2019-10-28 DIAGNOSIS — R0602 Shortness of breath: Secondary | ICD-10-CM | POA: Diagnosis not present

## 2019-10-28 DIAGNOSIS — R0600 Dyspnea, unspecified: Secondary | ICD-10-CM | POA: Diagnosis not present

## 2019-10-28 DIAGNOSIS — I493 Ventricular premature depolarization: Secondary | ICD-10-CM | POA: Diagnosis not present

## 2019-10-28 DIAGNOSIS — E785 Hyperlipidemia, unspecified: Secondary | ICD-10-CM | POA: Diagnosis not present

## 2019-10-28 DIAGNOSIS — I452 Bifascicular block: Secondary | ICD-10-CM | POA: Diagnosis not present

## 2019-10-28 DIAGNOSIS — I10 Essential (primary) hypertension: Secondary | ICD-10-CM | POA: Diagnosis not present

## 2019-10-28 DIAGNOSIS — R410 Disorientation, unspecified: Secondary | ICD-10-CM | POA: Diagnosis not present

## 2019-10-28 DIAGNOSIS — Z8249 Family history of ischemic heart disease and other diseases of the circulatory system: Secondary | ICD-10-CM | POA: Diagnosis not present

## 2019-10-31 DIAGNOSIS — E1122 Type 2 diabetes mellitus with diabetic chronic kidney disease: Secondary | ICD-10-CM | POA: Diagnosis not present

## 2019-10-31 DIAGNOSIS — M2550 Pain in unspecified joint: Secondary | ICD-10-CM | POA: Diagnosis not present

## 2019-10-31 DIAGNOSIS — Z6838 Body mass index (BMI) 38.0-38.9, adult: Secondary | ICD-10-CM | POA: Diagnosis not present

## 2019-10-31 DIAGNOSIS — E669 Obesity, unspecified: Secondary | ICD-10-CM | POA: Diagnosis not present

## 2019-10-31 DIAGNOSIS — K529 Noninfective gastroenteritis and colitis, unspecified: Secondary | ICD-10-CM | POA: Diagnosis not present

## 2019-10-31 DIAGNOSIS — N183 Chronic kidney disease, stage 3 unspecified: Secondary | ICD-10-CM | POA: Diagnosis not present

## 2019-10-31 DIAGNOSIS — R4182 Altered mental status, unspecified: Secondary | ICD-10-CM | POA: Diagnosis not present

## 2019-10-31 DIAGNOSIS — I10 Essential (primary) hypertension: Secondary | ICD-10-CM | POA: Diagnosis not present

## 2019-10-31 DIAGNOSIS — F329 Major depressive disorder, single episode, unspecified: Secondary | ICD-10-CM | POA: Diagnosis not present

## 2019-11-04 DIAGNOSIS — R0602 Shortness of breath: Secondary | ICD-10-CM | POA: Diagnosis not present

## 2019-11-04 DIAGNOSIS — I10 Essential (primary) hypertension: Secondary | ICD-10-CM | POA: Diagnosis not present

## 2019-11-04 DIAGNOSIS — R5383 Other fatigue: Secondary | ICD-10-CM | POA: Diagnosis not present

## 2019-11-04 DIAGNOSIS — R0600 Dyspnea, unspecified: Secondary | ICD-10-CM | POA: Diagnosis not present

## 2019-11-06 DIAGNOSIS — I700 Atherosclerosis of aorta: Secondary | ICD-10-CM | POA: Diagnosis not present

## 2019-11-06 DIAGNOSIS — R0602 Shortness of breath: Secondary | ICD-10-CM | POA: Diagnosis not present

## 2019-11-06 DIAGNOSIS — I6523 Occlusion and stenosis of bilateral carotid arteries: Secondary | ICD-10-CM | POA: Diagnosis not present

## 2019-11-10 DIAGNOSIS — Z6838 Body mass index (BMI) 38.0-38.9, adult: Secondary | ICD-10-CM | POA: Diagnosis not present

## 2019-11-10 DIAGNOSIS — E1143 Type 2 diabetes mellitus with diabetic autonomic (poly)neuropathy: Secondary | ICD-10-CM | POA: Diagnosis not present

## 2019-11-10 DIAGNOSIS — E1165 Type 2 diabetes mellitus with hyperglycemia: Secondary | ICD-10-CM | POA: Diagnosis not present

## 2019-11-10 DIAGNOSIS — K3184 Gastroparesis: Secondary | ICD-10-CM | POA: Diagnosis not present

## 2019-11-10 DIAGNOSIS — E669 Obesity, unspecified: Secondary | ICD-10-CM | POA: Diagnosis not present

## 2019-11-21 DIAGNOSIS — E1165 Type 2 diabetes mellitus with hyperglycemia: Secondary | ICD-10-CM | POA: Diagnosis not present

## 2019-11-26 DIAGNOSIS — Z6838 Body mass index (BMI) 38.0-38.9, adult: Secondary | ICD-10-CM | POA: Diagnosis not present

## 2019-11-26 DIAGNOSIS — E1122 Type 2 diabetes mellitus with diabetic chronic kidney disease: Secondary | ICD-10-CM | POA: Diagnosis not present

## 2019-11-26 DIAGNOSIS — R0600 Dyspnea, unspecified: Secondary | ICD-10-CM | POA: Diagnosis not present

## 2019-11-26 DIAGNOSIS — R0602 Shortness of breath: Secondary | ICD-10-CM | POA: Diagnosis not present

## 2019-11-26 DIAGNOSIS — N183 Chronic kidney disease, stage 3 unspecified: Secondary | ICD-10-CM | POA: Diagnosis not present

## 2019-11-26 DIAGNOSIS — R4182 Altered mental status, unspecified: Secondary | ICD-10-CM | POA: Diagnosis not present

## 2019-12-22 ENCOUNTER — Inpatient Hospital Stay (HOSPITAL_COMMUNITY)
Admission: EM | Admit: 2019-12-22 | Discharge: 2019-12-26 | DRG: 175 | Disposition: A | Discharge status: Home Health | Payer: Medicare HMO | Attending: Internal Medicine | Admitting: Internal Medicine

## 2019-12-22 ENCOUNTER — Encounter (HOSPITAL_COMMUNITY): Payer: Self-pay

## 2019-12-22 ENCOUNTER — Emergency Department (HOSPITAL_COMMUNITY): Payer: Medicare HMO

## 2019-12-22 DIAGNOSIS — E1165 Type 2 diabetes mellitus with hyperglycemia: Secondary | ICD-10-CM | POA: Diagnosis present

## 2019-12-22 DIAGNOSIS — R5383 Other fatigue: Secondary | ICD-10-CM | POA: Diagnosis not present

## 2019-12-22 DIAGNOSIS — R0902 Hypoxemia: Secondary | ICD-10-CM | POA: Diagnosis not present

## 2019-12-22 DIAGNOSIS — I959 Hypotension, unspecified: Secondary | ICD-10-CM | POA: Diagnosis present

## 2019-12-22 DIAGNOSIS — R0609 Other forms of dyspnea: Secondary | ICD-10-CM | POA: Diagnosis not present

## 2019-12-22 DIAGNOSIS — Z79899 Other long term (current) drug therapy: Secondary | ICD-10-CM | POA: Diagnosis not present

## 2019-12-22 DIAGNOSIS — E861 Hypovolemia: Secondary | ICD-10-CM | POA: Diagnosis present

## 2019-12-22 DIAGNOSIS — Z880 Allergy status to penicillin: Secondary | ICD-10-CM | POA: Diagnosis not present

## 2019-12-22 DIAGNOSIS — G4700 Insomnia, unspecified: Secondary | ICD-10-CM | POA: Diagnosis not present

## 2019-12-22 DIAGNOSIS — I82441 Acute embolism and thrombosis of right tibial vein: Secondary | ICD-10-CM | POA: Diagnosis present

## 2019-12-22 DIAGNOSIS — D72829 Elevated white blood cell count, unspecified: Secondary | ICD-10-CM | POA: Diagnosis not present

## 2019-12-22 DIAGNOSIS — R069 Unspecified abnormalities of breathing: Secondary | ICD-10-CM | POA: Diagnosis not present

## 2019-12-22 DIAGNOSIS — J9611 Chronic respiratory failure with hypoxia: Secondary | ICD-10-CM | POA: Diagnosis present

## 2019-12-22 DIAGNOSIS — I82411 Acute embolism and thrombosis of right femoral vein: Secondary | ICD-10-CM | POA: Diagnosis present

## 2019-12-22 DIAGNOSIS — R0602 Shortness of breath: Secondary | ICD-10-CM | POA: Diagnosis not present

## 2019-12-22 DIAGNOSIS — Z888 Allergy status to other drugs, medicaments and biological substances status: Secondary | ICD-10-CM | POA: Diagnosis not present

## 2019-12-22 DIAGNOSIS — J9691 Respiratory failure, unspecified with hypoxia: Secondary | ICD-10-CM | POA: Diagnosis present

## 2019-12-22 DIAGNOSIS — I4510 Unspecified right bundle-branch block: Secondary | ICD-10-CM | POA: Diagnosis present

## 2019-12-22 DIAGNOSIS — E785 Hyperlipidemia, unspecified: Secondary | ICD-10-CM | POA: Diagnosis present

## 2019-12-22 DIAGNOSIS — K529 Noninfective gastroenteritis and colitis, unspecified: Secondary | ICD-10-CM | POA: Diagnosis present

## 2019-12-22 DIAGNOSIS — I82431 Acute embolism and thrombosis of right popliteal vein: Secondary | ICD-10-CM | POA: Diagnosis present

## 2019-12-22 DIAGNOSIS — I700 Atherosclerosis of aorta: Secondary | ICD-10-CM | POA: Diagnosis not present

## 2019-12-22 DIAGNOSIS — I2602 Saddle embolus of pulmonary artery with acute cor pulmonale: Secondary | ICD-10-CM | POA: Diagnosis not present

## 2019-12-22 DIAGNOSIS — K921 Melena: Secondary | ICD-10-CM | POA: Diagnosis not present

## 2019-12-22 DIAGNOSIS — N179 Acute kidney failure, unspecified: Secondary | ICD-10-CM | POA: Diagnosis present

## 2019-12-22 DIAGNOSIS — I2699 Other pulmonary embolism without acute cor pulmonale: Secondary | ICD-10-CM | POA: Diagnosis not present

## 2019-12-22 DIAGNOSIS — R071 Chest pain on breathing: Secondary | ICD-10-CM | POA: Diagnosis not present

## 2019-12-22 DIAGNOSIS — I2609 Other pulmonary embolism with acute cor pulmonale: Secondary | ICD-10-CM | POA: Diagnosis present

## 2019-12-22 DIAGNOSIS — J9811 Atelectasis: Secondary | ICD-10-CM | POA: Diagnosis not present

## 2019-12-22 DIAGNOSIS — J9621 Acute and chronic respiratory failure with hypoxia: Secondary | ICD-10-CM | POA: Diagnosis not present

## 2019-12-22 DIAGNOSIS — I10 Essential (primary) hypertension: Secondary | ICD-10-CM | POA: Diagnosis present

## 2019-12-22 DIAGNOSIS — I2510 Atherosclerotic heart disease of native coronary artery without angina pectoris: Secondary | ICD-10-CM | POA: Diagnosis not present

## 2019-12-22 DIAGNOSIS — I82451 Acute embolism and thrombosis of right peroneal vein: Secondary | ICD-10-CM | POA: Diagnosis present

## 2019-12-22 DIAGNOSIS — R0600 Dyspnea, unspecified: Secondary | ICD-10-CM | POA: Diagnosis not present

## 2019-12-22 DIAGNOSIS — Z20822 Contact with and (suspected) exposure to covid-19: Secondary | ICD-10-CM | POA: Diagnosis present

## 2019-12-22 DIAGNOSIS — F329 Major depressive disorder, single episode, unspecified: Secondary | ICD-10-CM | POA: Diagnosis present

## 2019-12-22 HISTORY — DX: Type 2 diabetes mellitus without complications: E11.9

## 2019-12-22 HISTORY — DX: Essential (primary) hypertension: I10

## 2019-12-22 MED ORDER — METRONIDAZOLE IN NACL 5-0.79 MG/ML-% IV SOLN
500.0000 mg | Freq: Once | INTRAVENOUS | Status: AC
  Administered 2019-12-23: 500 mg via INTRAVENOUS
  Filled 2019-12-22: qty 100

## 2019-12-22 MED ORDER — SODIUM CHLORIDE 0.9 % IV SOLN
2.0000 g | Freq: Once | INTRAVENOUS | Status: AC
  Administered 2019-12-22: 2 g via INTRAVENOUS
  Filled 2019-12-22: qty 2

## 2019-12-22 MED ORDER — ACETAMINOPHEN 325 MG PO TABS
650.0000 mg | ORAL_TABLET | Freq: Once | ORAL | Status: AC
  Administered 2019-12-22: 650 mg via ORAL
  Filled 2019-12-22: qty 2

## 2019-12-22 MED ORDER — VANCOMYCIN HCL IN DEXTROSE 1-5 GM/200ML-% IV SOLN: 1000.0000 mg | Freq: Once | INTRAVENOUS | Status: DC

## 2019-12-22 MED ORDER — VANCOMYCIN HCL 2000 MG/400ML IV SOLN
2000.0000 mg | Freq: Once | INTRAVENOUS | Status: DC
  Administered 2019-12-23: 2000 mg via INTRAVENOUS
  Filled 2019-12-22: qty 400

## 2019-12-22 MED ORDER — SODIUM CHLORIDE 0.9 % IV BOLUS
1000.0000 mL | Freq: Once | INTRAVENOUS | Status: AC
  Administered 2019-12-22: 1000 mL via INTRAVENOUS

## 2019-12-22 MED ORDER — SODIUM CHLORIDE 0.9 % IV BOLUS: 1000.0000 mL | Freq: Once | INTRAVENOUS | Status: DC

## 2019-12-22 NOTE — ED Triage Notes (Signed)
SOB X2 weeks increased with exertion and increased fatigue . Hx COVID with antibodies, never tested. Pt vaccinated. 94 RA , EMS put on 2L now 97. ST with right bundle branch per EMS, no home 02.  EMS VS  HR 120-130 97.11f Oral RR 20 CBG 270   PMH : DM, HTN

## 2019-12-22 NOTE — ED Provider Notes (Signed)
[facility] EMERGENCY DEPARTMENT Provider Note   CSN: [id number] Arrival date & time: 12/22/19  2234     History Chief Complaint  Patient presents with  . Shortness of Breath    Dawn Peterson is a 79 y.o. female with PMHx HTN and DM who presents to the ED today with complaint of gradual onset, constant, dyspnea on exertion x 2 days however per triage report pt had stated SOB x 2 weeks. When EMS arrived pt was found to be in sinus tachycardia with RBBB. No temp. Pt was found to be 94% on RA and placed on 2L with improvement to 97%. She is not typically on O2 at home. She denies any other symptoms including fevers, chills, chest pain, cough, abdominal pain, N/V/D, constipation, urinary sx, leg swelling, or any other associated symptoms. She denies missing any doses of either her BP or diabetic medications. Pt has been vaccinated against COVID 19 - believes she received the Moderna vaccine.   More information provided by daughter - reports pt has been having dyspnea on exertion as well as "brain fog" since March. Has been seen by both PCP and cardiology with negative CT head and normal ECHO/stress test (see ECHO below). Daughter does report pt had COVID antibody test done which came back positive - she was told by her PCP that pt likely had COVID at some point which may be contributing to her brain fog. Daughter is unsure if the antibodies are positive from having COVID vs getting the vaccine in February. She does mention pt's blood sugars have been uncontrolled for the past month as well - was recently changed from Metformin to [place] however sugars continue to run in the 200-300s. Daughter denies any acute changes in the past 1-2 weeks including known fevers, chills, headache, cough, chest pain, abdominal pain, nausea, vomiting, diarrhea, urinary sx.   The history is provided by the patient, medical records and a relative.   Per Chart review: Cardiology appt on 05/11 for SOB  with ECHO: The left ventricular size is normal.  There is normal left ventricular wall thickness.  Left ventricular systolic function is normal.  LV ejection fraction = 60-65%.  The left ventricular wall motion is normal.  There is mild mitral annular calcification.  There is mild aortic valve thickening.  There is no aortic stenosis.  Mild pulmonic valvular regurgitation.     Past Medical History:  Diagnosis Date  . Diabetes mellitus without complication ([place])   . Hypertension     Patient Active Problem List   Diagnosis Date Noted  . Hypoxemic respiratory failure, chronic ([place]) 12/23/2019     OB History   No obstetric history on file.     No family history on file.  Social History   Tobacco Use  . Smoking status: Never Smoker  Substance Use Topics  . Alcohol use: Not Currently  . Drug use: Not on file    Home Medications Prior to Admission medications   Medication Sig Start Date End Date Taking? Authorizing Provider  amLODipine-valsartan (EXFORGE) 5-320 MG tablet Take 1 tablet by mouth daily. 11/30/19  Yes [provider]  chlorthalidone (HYGROTON) 25 MG tablet Take 12.5 mg by mouth daily. 11/30/19  Yes [provider]  donepezil (ARICEPT) 10 MG tablet Take 10 mg by mouth daily. 12/01/19  Yes [provider]  escitalopram (LEXAPRO) 10 MG tablet Take 10 mg by mouth daily. 11/04/19  Yes [provider]  FARXIGA 10 MG TABS tablet  Take 10 mg by mouth daily. 12/12/19  Yes [provider]  rosuvastatin (CRESTOR) 20 MG tablet Take 20 mg by mouth at bedtime. 11/03/19  Yes [provider]    Allergies    Predicort [prednisolone] and Penicillins  Review of Systems   Review of Systems  Constitutional: Negative for chills and fever.  Respiratory: Positive for shortness of breath. Negative for cough.   Cardiovascular: Negative for chest pain and leg swelling.  Gastrointestinal: Negative for abdominal pain,  constipation, diarrhea, nausea and vomiting.  Genitourinary: Negative for difficulty urinating and frequency.  All other systems reviewed and are negative.   Physical Exam Updated Vital Signs BP (!) 84/54 (BP Location: Right Arm)   Pulse (!) 122   Temp 98.2 F (36.8 C) (Oral)   Resp 16   SpO2 97%   Physical Exam Vitals and nursing note reviewed.  Constitutional:      Appearance: She is not ill-appearing or diaphoretic.  HENT:     Head: Normocephalic and atraumatic.  Eyes:     Conjunctiva/sclera: Conjunctivae normal.  Cardiovascular:     Rate and Rhythm: Regular rhythm. Tachycardia present.  Pulmonary:     Effort: Pulmonary effort is normal.     Breath sounds: Decreased breath sounds present. No wheezing, rhonchi or rales.     Comments: Able to speak in full sentences without difficulty. Satting 97% on 2L. Mild diminished breath sounds bilaterally.  Chest:     Chest wall: No tenderness.  Abdominal:     Palpations: Abdomen is soft.     Tenderness: There is no abdominal tenderness. There is no guarding or rebound.  Musculoskeletal:     Cervical back: Neck supple.     Right lower leg: No edema.     Left lower leg: No edema.  Skin:    General: Skin is warm and dry.  Neurological:     Mental Status: She is alert.     ED Results / Procedures / Treatments   Labs (all labs ordered are listed, but only abnormal results are displayed) Labs Reviewed  COMPREHENSIVE METABOLIC PANEL - Abnormal; Notable for the following components:      Result Value   CO2 20 (*)    Glucose, Bld 345 (*)    BUN 30 (*)    Creatinine, Ser 2.08 (*)    Albumin 3.3 (*)    GFR calc non Af Amer 22 (*)    GFR calc Af Amer 26 (*)    All other components within normal limits  CBC WITH DIFFERENTIAL/PLATELET - Abnormal; Notable for the following components:   WBC 15.9 (*)    Neutro Abs 11.1 (*)    Monocytes Absolute 1.5 (*)    Abs Immature Granulocytes 0.21 (*)    All other components within normal  limits  D-DIMER, QUANTITATIVE (NOT AT [facility]) - Abnormal; Notable for the following components:   D-Dimer, Quant 9.77 (*)    All other components within normal limits  LACTIC ACID, PLASMA - Abnormal; Notable for the following components:   Lactic Acid, Venous 2.2 (*)    All other components within normal limits  TROPONIN I (HIGH SENSITIVITY) - Abnormal; Notable for the following components:   Troponin I (High Sensitivity) 540 (*)    All other components within normal limits  SARS CORONAVIRUS 2 BY RT PCR (HOSPITAL ORDER, [place] [facility])  CULTURE, BLOOD (ROUTINE X 2)  CULTURE, BLOOD (ROUTINE X 2)  URINE CULTURE  URINALYSIS, ROUTINE W REFLEX  MICROSCOPIC  LACTIC ACID, PLASMA  PROTIME-INR  APTT  HEPARIN LEVEL (UNFRACTIONATED)  CBC  HEMOGLOBIN A1C  TROPONIN I (HIGH SENSITIVITY)    EKG EKG Interpretation  Date/Time:  Monday December 22 2019 23:17:55 EDT Ventricular Rate:  116 PR Interval:  202 QRS Duration: 118 QT Interval:  306 QTC Calculation: 425 R Axis:   145 Text Interpretation: Sinus tachycardia Incomplete right bundle branch block Right ventricular hypertrophy Nonspecific T wave abnormality Abnormal ECG Confirmed by Virgel Manifold [phone]) on 12/22/2019 11:52:44 PM   Radiology DG Chest 2 View  Result Date: 12/22/2019 CLINICAL DATA:  Shortness of breath. Symptoms for 2 weeks, increased with exertion. Fatigue. EXAM: CHEST - 2 VIEW COMPARISON:  Radiograph 11/06/2019 FINDINGS: Normal heart size with unchanged mediastinal contours. Mild aortic atherosclerosis. No pulmonary edema. No significant pleural effusion. No focal airspace disease. Stable degenerative change in the spine. IMPRESSION: No acute chest findings. Electronically Signed   By: Keith Rake M.D.   On: 12/22/2019 23:18   CT ANGIO CHEST PE W OR WO CONTRAST  Result Date: 12/23/2019 CLINICAL DATA:  Dyspnea on exertion for 2 weeks, fatigue EXAM: CT ANGIOGRAPHY CHEST WITH CONTRAST TECHNIQUE:  Multidetector CT imaging of the chest was performed using the standard protocol during bolus administration of intravenous contrast. Multiplanar CT image reconstructions and MIPs were obtained to evaluate the vascular anatomy. CONTRAST:  89mL OMNIPAQUE IOHEXOL 350 MG/ML SOLN COMPARISON:  08/19/2008, 12/22/2019 FINDINGS: Cardiovascular: This is a technically adequate evaluation of the pulmonary vasculature. There are large bilateral pulmonary emboli within the main pulmonary arteries. There is significant clot burden, with straightening of the interventricular septum and dilation of the right ventricle consistent with right heart strain. No pericardial effusion. Normal caliber of the thoracic aorta. Mild atherosclerosis of the aorta and coronary vessels. Mediastinum/Nodes: No enlarged mediastinal, hilar, or axillary lymph nodes. Thyroid gland, trachea, and esophagus demonstrate no significant findings. Lungs/Pleura: No airspace disease, effusion, or pneumothorax. The central airways are patent. Upper Abdomen: Calcifications within the gallbladder may reflect cholelithiasis or mural calcification. No evidence of cholecystitis. Gallbladder is incompletely imaged. No other acute upper abdominal findings. Musculoskeletal: No acute or destructive bony lesions. Reconstructed images demonstrate no additional findings. Review of the MIP images confirms the above findings. IMPRESSION: 1. Large bilateral pulmonary emboli with CT evidence of right heart strain (RV/LV Ratio = 3.9) consistent with at least submassive (intermediate risk) PE. The presence of right heart strain has been associated with an increased risk of morbidity and mortality. 2. Aortic Atherosclerosis (ICD10-I70.0). 3. Gallbladder calcifications which may reflect calcified gallstones or mural calcifications. No evidence of cholecystitis. These results were called by telephone at the time of interpretation on 12/23/2019 at 4:01 am to provider PA Eustaquio Maize ,  who verbally acknowledged these results. Electronically Signed   By: Randa Ngo M.D.   On: 12/23/2019 04:02    Procedures .Critical Care Performed by: Eustaquio Maize, PA-C Authorized by: Eustaquio Maize, PA-C   Critical care provider statement:    Critical care time (minutes):  60   Critical care was necessary to treat or prevent imminent or life-threatening deterioration of the following conditions:  Circulatory failure   Critical care was time spent personally by me on the following activities:  Discussions with consultants, evaluation of patient's response to treatment, examination of patient, ordering and performing treatments and interventions, ordering and review of laboratory studies, ordering and review of radiographic studies, pulse oximetry, re-evaluation of patient's condition, obtaining history from patient or surrogate and review of old  charts   (including critical care time)  Medications Ordered in ED Medications  docusate sodium (COLACE) capsule 100 mg (has no administration in time range)  polyethylene glycol (MIRALAX / GLYCOLAX) packet 17 g (has no administration in time range)  0.9 %  sodium chloride infusion (has no administration in time range)  acetaminophen (TYLENOL) tablet 650 mg (has no administration in time range)  0.9 %  sodium chloride infusion (has no administration in time range)  insulin aspart (novoLOG) injection 0-9 Units (has no administration in time range)  alteplase (ACTIVASE) 1 mg/mL infusion 100 mg (has no administration in time range)  sodium chloride 0.9 % bolus 1,000 mL (0 mLs Intravenous Stopped 12/23/19 0119)  aztreonam (AZACTAM) 2 g in sodium chloride 0.9 % 100 mL IVPB (0 g Intravenous Stopped 12/23/19 0117)  metroNIDAZOLE (FLAGYL) IVPB 500 mg (0 mg Intravenous Stopped 12/23/19 0230)  acetaminophen (TYLENOL) tablet 650 mg (650 mg Oral Given 12/22/19 2358)  heparin bolus via infusion 3,000 Units (3,000 Units Intravenous Bolus from Bag 12/23/19  0145)  sodium chloride 0.9 % bolus 1,000 mL (0 mLs Intravenous Stopped 12/23/19 0309)  iohexol (OMNIPAQUE) 350 MG/ML injection 75 mL (75 mLs Intravenous Contrast Given 12/23/19 0322)    ED Course  I have reviewed the triage vital signs and the nursing notes.  Pertinent labs & imaging results that were available during my care of the patient were reviewed by me and considered in my medical decision making (see chart for details).  Clinical Course as of Dec 22 424  Mon Dec 22, 2019  2341 Temp: 100.3 F (37.9 C) [MV]  2342 BP(!): 114/95 [MV]  2344 Pulse Rate(!): 115 [MV]  Tue Dec 23, 2019  0014 WBC(!): 15.9 [MV]  0029 D-Dimer, Quant(!): 9.77 [MV]  0114 Troponin I (High Sensitivity)(!!): 540 [MV]  0218 SARS Coronavirus 2: NEGATIVE [MV]  0236 Lactic Acid, Venous(!!): 2.2 [MV]    Clinical Course User Index [MV] Eustaquio Maize, PA-C   MDM Rules/Calculators/A&P                          78 year old female who presents to the ED with initial complaint of shortness of breath x2 days however with further assessment it does appear patient has been dealing with dyspnea on exertion for several months as well as "brain fog" per daughter. No new sx in the past 1-2 weeks. Daughter reports worsening SOB Today prompting EMS.  On arrival to the ED patient is noted to be tachycardic in the 120s, blood pressure soft 84/54. Oral temperature 98.2 however rectal temp assessed at 100.3.  Patient's only complaint is dyspnea on exertion.  She denies any other additional symptoms at this time. On exam patient has mild diminished breath sounds bilaterally; she is able to speak in full sentences without difficulty. She was placed on 2L by EMS after O2 94% on RA. Will wean. Will work-up from a sepsis standpoint given fever and tachycardia, source either pulmonary versus urinary however given sx of SOB have been ongoing for several months with negative ECHO and stress test difficult to say if cause for SOB will be found  today. D dimer added with possible concern for PE causing tachycardia. Will start on antibiotics, patient does have a penicillin allergy, do not have any records in our system it does not appear patient has ever taken cephalosporins. Pt has been vaccinated against COVID 19. Attending physician Dr. Wilson Singer has evaluated patient as well and agrees with  plan.   WBC elevated at 15.9 with left shift CXR clear D dimer elevated at 9.77, unable to age adjust. Will obtain CTA at this time. Question if PE is causing fevers and SOB?   Troponin elevated at 540 - will consult cards however suspect elevation s/2 PE?  Unfortunately GFR 22 and creatinine 2.08; unable to obtain CTA at this time. Will change to VQ.   Have discussed case with Dr. Kalman Shan Cardiology who will come evaluate patient given elevated troponin with plan for bedside ultrasound. EKG evaluated by him with findings concerning for PE. Heparin per pharmacy ordered to cover for potential PE.   Dr. Kalman Shan reports pt's RV is blown; concern for massive PE. Will consult critical care at this time.   1:50 AM Critical care to come evaluate patient.   Plan to have critical care discuss risk vs benefit of CTA given kidney function with concern that pt may need IR involvement with cath for clot retrieval. Have discussed case with Dr. Joelyn Oms radiologist who reports if medically necessary than CTA can be done with proper documentation and pt understanding of risks of worsening kidney function.   Dr. Mariane Masters with critical care has discussed risks vs benefit with pt and daughter - please see his note. Pt and daughter wish to proceed with CTA to determine need for tPA which may be medically necessary given pt's continued blood pressure with systolic in the 09T currently. They understand the risks involved with elevated kidney function. CTA ordered by PCCM. They will admit.   This note was prepared using Dragon voice recognition software and may include unintentional  dictation errors due to the inherent limitations of voice recognition software.  Final Clinical Impression(s) / ED Diagnoses Final diagnoses:  SOB (shortness of breath)  Other acute pulmonary embolism with acute cor pulmonale [facility])    Rx / DC Orders ED Discharge Orders    None       Eustaquio Maize, PA-C 12/23/19 0426    Merryl Hacker, MD 12/23/19 [phone]

## 2019-12-23 ENCOUNTER — Encounter (HOSPITAL_COMMUNITY): Payer: Self-pay | Admitting: Specialist

## 2019-12-23 ENCOUNTER — Emergency Department (HOSPITAL_COMMUNITY): Payer: Medicare HMO

## 2019-12-23 ENCOUNTER — Inpatient Hospital Stay (HOSPITAL_COMMUNITY): Payer: Medicare HMO

## 2019-12-23 DIAGNOSIS — E861 Hypovolemia: Secondary | ICD-10-CM | POA: Diagnosis present

## 2019-12-23 DIAGNOSIS — I82441 Acute embolism and thrombosis of right tibial vein: Secondary | ICD-10-CM | POA: Diagnosis present

## 2019-12-23 DIAGNOSIS — I2602 Saddle embolus of pulmonary artery with acute cor pulmonale: Secondary | ICD-10-CM | POA: Diagnosis not present

## 2019-12-23 DIAGNOSIS — I82451 Acute embolism and thrombosis of right peroneal vein: Secondary | ICD-10-CM | POA: Diagnosis present

## 2019-12-23 DIAGNOSIS — N179 Acute kidney failure, unspecified: Secondary | ICD-10-CM | POA: Diagnosis present

## 2019-12-23 DIAGNOSIS — I4510 Unspecified right bundle-branch block: Secondary | ICD-10-CM | POA: Diagnosis present

## 2019-12-23 DIAGNOSIS — J9611 Chronic respiratory failure with hypoxia: Secondary | ICD-10-CM | POA: Diagnosis present

## 2019-12-23 DIAGNOSIS — E1165 Type 2 diabetes mellitus with hyperglycemia: Secondary | ICD-10-CM | POA: Diagnosis present

## 2019-12-23 DIAGNOSIS — K529 Noninfective gastroenteritis and colitis, unspecified: Secondary | ICD-10-CM | POA: Diagnosis present

## 2019-12-23 DIAGNOSIS — I10 Essential (primary) hypertension: Secondary | ICD-10-CM | POA: Diagnosis present

## 2019-12-23 DIAGNOSIS — Z79899 Other long term (current) drug therapy: Secondary | ICD-10-CM | POA: Diagnosis not present

## 2019-12-23 DIAGNOSIS — Z888 Allergy status to other drugs, medicaments and biological substances status: Secondary | ICD-10-CM | POA: Diagnosis not present

## 2019-12-23 DIAGNOSIS — Z880 Allergy status to penicillin: Secondary | ICD-10-CM | POA: Diagnosis not present

## 2019-12-23 DIAGNOSIS — I2699 Other pulmonary embolism without acute cor pulmonale: Secondary | ICD-10-CM

## 2019-12-23 DIAGNOSIS — I2609 Other pulmonary embolism with acute cor pulmonale: Secondary | ICD-10-CM | POA: Diagnosis present

## 2019-12-23 DIAGNOSIS — F329 Major depressive disorder, single episode, unspecified: Secondary | ICD-10-CM | POA: Diagnosis present

## 2019-12-23 DIAGNOSIS — J9691 Respiratory failure, unspecified with hypoxia: Secondary | ICD-10-CM | POA: Diagnosis present

## 2019-12-23 DIAGNOSIS — E785 Hyperlipidemia, unspecified: Secondary | ICD-10-CM | POA: Diagnosis present

## 2019-12-23 DIAGNOSIS — Z20822 Contact with and (suspected) exposure to covid-19: Secondary | ICD-10-CM | POA: Diagnosis present

## 2019-12-23 DIAGNOSIS — I82431 Acute embolism and thrombosis of right popliteal vein: Secondary | ICD-10-CM | POA: Diagnosis present

## 2019-12-23 DIAGNOSIS — I959 Hypotension, unspecified: Secondary | ICD-10-CM | POA: Diagnosis present

## 2019-12-23 DIAGNOSIS — G4700 Insomnia, unspecified: Secondary | ICD-10-CM | POA: Diagnosis not present

## 2019-12-23 DIAGNOSIS — I82411 Acute embolism and thrombosis of right femoral vein: Secondary | ICD-10-CM | POA: Diagnosis present

## 2019-12-23 LAB — CBC WITH DIFFERENTIAL/PLATELET
Abs Immature Granulocytes: 0.21 10*3/uL — ABNORMAL HIGH (ref 0.00–0.07)
Basophils Absolute: 0.1 10*3/uL (ref 0.0–0.1)
Basophils Relative: 1 %
Eosinophils Absolute: 0.4 10*3/uL (ref 0.0–0.5)
Eosinophils Relative: 2 %
HCT: 44.4 % (ref 36.0–46.0)
Hemoglobin: 14.5 g/dL (ref 12.0–15.0)
Immature Granulocytes: 1 %
Lymphocytes Relative: 17 %
Lymphs Abs: 2.7 10*3/uL (ref 0.7–4.0)
MCH: 29 pg (ref 26.0–34.0)
MCHC: 32.7 g/dL (ref 30.0–36.0)
MCV: 88.8 fL (ref 80.0–100.0)
Monocytes Absolute: 1.5 10*3/uL — ABNORMAL HIGH (ref 0.1–1.0)
Monocytes Relative: 9 %
Neutro Abs: 11.1 10*3/uL — ABNORMAL HIGH (ref 1.7–7.7)
Neutrophils Relative %: 70 %
Platelets: 297 10*3/uL (ref 150–400)
RBC: 5 MIL/uL (ref 3.87–5.11)
RDW: 13.5 % (ref 11.5–15.5)
WBC: 15.9 10*3/uL — ABNORMAL HIGH (ref 4.0–10.5)
nRBC: 0 % (ref 0.0–0.2)

## 2019-12-23 LAB — COMPREHENSIVE METABOLIC PANEL
ALT: 16 U/L (ref 0–44)
AST: 23 U/L (ref 15–41)
Albumin: 3.3 g/dL — ABNORMAL LOW (ref 3.5–5.0)
Alkaline Phosphatase: 63 U/L (ref 38–126)
Anion gap: 14 (ref 5–15)
BUN: 30 mg/dL — ABNORMAL HIGH (ref 8–23)
CO2: 20 mmol/L — ABNORMAL LOW (ref 22–32)
Calcium: 9.8 mg/dL (ref 8.9–10.3)
Chloride: 103 mmol/L (ref 98–111)
Creatinine, Ser: 2.08 mg/dL — ABNORMAL HIGH (ref 0.44–1.00)
GFR calc Af Amer: 26 mL/min — ABNORMAL LOW (ref >60)
GFR calc non Af Amer: 22 mL/min — ABNORMAL LOW (ref >60)
Glucose, Bld: 345 mg/dL — ABNORMAL HIGH (ref 70–99)
Potassium: 3.9 mmol/L (ref 3.5–5.1)
Sodium: 137 mmol/L (ref 135–145)
Total Bilirubin: 0.6 mg/dL (ref 0.3–1.2)
Total Protein: 7.5 g/dL (ref 6.5–8.1)

## 2019-12-23 LAB — BASIC METABOLIC PANEL
Anion gap: 8 (ref 5–15)
BUN: 26 mg/dL — ABNORMAL HIGH (ref 8–23)
CO2: 19 mmol/L — ABNORMAL LOW (ref 22–32)
Calcium: 8.4 mg/dL — ABNORMAL LOW (ref 8.9–10.3)
Chloride: 111 mmol/L (ref 98–111)
Creatinine, Ser: 1.26 mg/dL — ABNORMAL HIGH (ref 0.44–1.00)
GFR calc Af Amer: 48 mL/min — ABNORMAL LOW (ref >60)
GFR calc non Af Amer: 41 mL/min — ABNORMAL LOW (ref >60)
Glucose, Bld: 192 mg/dL — ABNORMAL HIGH (ref 70–99)
Potassium: 3.3 mmol/L — ABNORMAL LOW (ref 3.5–5.1)
Sodium: 138 mmol/L (ref 135–145)

## 2019-12-23 LAB — CBC
HCT: 37.5 % (ref 36.0–46.0)
Hemoglobin: 12 g/dL (ref 12.0–15.0)
MCH: 28.4 pg (ref 26.0–34.0)
MCHC: 32 g/dL (ref 30.0–36.0)
MCV: 88.9 fL (ref 80.0–100.0)
Platelets: 209 10*3/uL (ref 150–400)
RBC: 4.22 MIL/uL (ref 3.87–5.11)
RDW: 13.6 % (ref 11.5–15.5)
WBC: 10.7 10*3/uL — ABNORMAL HIGH (ref 4.0–10.5)
nRBC: 0 % (ref 0.0–0.2)

## 2019-12-23 LAB — APTT
aPTT: 41 seconds — ABNORMAL HIGH (ref 24–36)
aPTT: 67 seconds — ABNORMAL HIGH (ref 24–36)

## 2019-12-23 LAB — SARS CORONAVIRUS 2 BY RT PCR (HOSPITAL ORDER, PERFORMED IN ~~LOC~~ HOSPITAL LAB): SARS Coronavirus 2: NEGATIVE

## 2019-12-23 LAB — HEMOGLOBIN A1C
Hgb A1c MFr Bld: 9.1 % — ABNORMAL HIGH (ref 4.8–5.6)
Mean Plasma Glucose: 214.47 mg/dL

## 2019-12-23 LAB — GLUCOSE, CAPILLARY
Glucose-Capillary: 204 mg/dL — ABNORMAL HIGH (ref 70–99)
Glucose-Capillary: 177 mg/dL — ABNORMAL HIGH (ref 70–99)
Glucose-Capillary: 196 mg/dL — ABNORMAL HIGH (ref 70–99)
Glucose-Capillary: 166 mg/dL — ABNORMAL HIGH (ref 70–99)
Glucose-Capillary: 216 mg/dL — ABNORMAL HIGH (ref 70–99)

## 2019-12-23 LAB — MRSA PCR SCREENING: MRSA by PCR: NEGATIVE

## 2019-12-23 LAB — PROTIME-INR
INR: 1.3 — ABNORMAL HIGH (ref 0.8–1.2)
Prothrombin Time: 15.8 seconds — ABNORMAL HIGH (ref 11.4–15.2)

## 2019-12-23 LAB — D-DIMER, QUANTITATIVE: D-Dimer, Quant: 9.77 ug/mL-FEU — ABNORMAL HIGH (ref 0.00–0.50)

## 2019-12-23 LAB — TROPONIN I (HIGH SENSITIVITY)
Troponin I (High Sensitivity): 540 ng/L (ref <18)
Troponin I (High Sensitivity): 322 ng/L (ref <18)

## 2019-12-23 LAB — HEPARIN LEVEL (UNFRACTIONATED): Heparin Unfractionated: 0.65 IU/mL (ref 0.30–0.70)

## 2019-12-23 LAB — LACTIC ACID, PLASMA
Lactic Acid, Venous: 2.2 mmol/L (ref 0.5–1.9)
Lactic Acid, Venous: 1.4 mmol/L (ref 0.5–1.9)

## 2019-12-23 MED ORDER — MELATONIN 3 MG PO TABS
3.0000 mg | ORAL_TABLET | Freq: Every evening | ORAL | Status: DC | PRN
  Administered 2019-12-23 – 2019-12-24 (×2): 3 mg via ORAL
  Filled 2019-12-23 (×2): qty 1

## 2019-12-23 MED ORDER — IOHEXOL 350 MG/ML SOLN
75.0000 mL | Freq: Once | INTRAVENOUS | Status: AC | PRN
  Administered 2019-12-23: 75 mL via INTRAVENOUS

## 2019-12-23 MED ORDER — SODIUM CHLORIDE 0.9 % IV SOLN: INTRAVENOUS | Status: DC

## 2019-12-23 MED ORDER — ROSUVASTATIN CALCIUM 5 MG PO TABS
10.0000 mg | ORAL_TABLET | Freq: Every day | ORAL | Status: DC
  Administered 2019-12-23 – 2019-12-25 (×3): 10 mg via ORAL
  Filled 2019-12-23 (×3): qty 2

## 2019-12-23 MED ORDER — SODIUM CHLORIDE 0.9 % IV SOLN
250.0000 mL | Freq: Once | INTRAVENOUS | Status: AC
  Administered 2019-12-23: 250 mL via INTRAVENOUS

## 2019-12-23 MED ORDER — POLYETHYLENE GLYCOL 3350 17 G PO PACK: 17.0000 g | PACK | Freq: Every day | ORAL | Status: DC | PRN

## 2019-12-23 MED ORDER — DONEPEZIL HCL 10 MG PO TABS
10.0000 mg | ORAL_TABLET | Freq: Every day | ORAL | Status: DC
  Administered 2019-12-23 – 2019-12-24 (×2): 10 mg via ORAL
  Filled 2019-12-23 (×5): qty 1

## 2019-12-23 MED ORDER — SODIUM CHLORIDE 0.9 % IV BOLUS
1000.0000 mL | Freq: Once | INTRAVENOUS | Status: AC
  Administered 2019-12-23: 1000 mL via INTRAVENOUS

## 2019-12-23 MED ORDER — HEPARIN (PORCINE) 25000 UT/250ML-% IV SOLN
1200.0000 [IU]/h | INTRAVENOUS | Status: AC
  Administered 2019-12-23 – 2019-12-24 (×2): 1200 [IU]/h via INTRAVENOUS
  Filled 2019-12-23 (×2): qty 250

## 2019-12-23 MED ORDER — POTASSIUM CHLORIDE CRYS ER 20 MEQ PO TBCR
40.0000 meq | EXTENDED_RELEASE_TABLET | Freq: Once | ORAL | Status: AC
  Administered 2019-12-23: 40 meq via ORAL
  Filled 2019-12-23: qty 2

## 2019-12-23 MED ORDER — ALTEPLASE (PULMONARY EMBOLISM) INFUSION
100.0000 mg | Freq: Once | INTRAVENOUS | Status: AC
  Administered 2019-12-23: 100 mg via INTRAVENOUS
  Filled 2019-12-23: qty 100

## 2019-12-23 MED ORDER — ESCITALOPRAM OXALATE 10 MG PO TABS
10.0000 mg | ORAL_TABLET | Freq: Every day | ORAL | Status: DC
  Administered 2019-12-23 – 2019-12-25 (×3): 10 mg via ORAL
  Filled 2019-12-23 (×3): qty 1

## 2019-12-23 MED ORDER — CHLORHEXIDINE GLUCONATE CLOTH 2 % EX PADS
6.0000 | MEDICATED_PAD | Freq: Every day | CUTANEOUS | Status: DC
  Administered 2019-12-23 – 2019-12-25 (×3): 6 via TOPICAL

## 2019-12-23 MED ORDER — DOCUSATE SODIUM 100 MG PO CAPS: 100.0000 mg | ORAL_CAPSULE | Freq: Two times a day (BID) | ORAL | Status: DC | PRN

## 2019-12-23 MED ORDER — INSULIN ASPART 100 UNIT/ML ~~LOC~~ SOLN
0.0000 [IU] | Freq: Three times a day (TID) | SUBCUTANEOUS | Status: DC
  Administered 2019-12-23 – 2019-12-24 (×6): 2 [IU] via SUBCUTANEOUS
  Administered 2019-12-25: 1 [IU] via SUBCUTANEOUS
  Administered 2019-12-25: 2 [IU] via SUBCUTANEOUS
  Administered 2019-12-25: 1 [IU] via SUBCUTANEOUS
  Administered 2019-12-26: 2 [IU] via SUBCUTANEOUS

## 2019-12-23 MED ORDER — HEPARIN BOLUS VIA INFUSION
3000.0000 [IU] | Freq: Once | INTRAVENOUS | Status: AC
  Administered 2019-12-23: 3000 [IU] via INTRAVENOUS
  Filled 2019-12-23: qty 3000

## 2019-12-23 MED ORDER — ACETAMINOPHEN 325 MG PO TABS
650.0000 mg | ORAL_TABLET | ORAL | Status: DC | PRN
  Administered 2019-12-23 – 2019-12-26 (×7): 650 mg via ORAL
  Filled 2019-12-23 (×7): qty 2

## 2019-12-23 MED ORDER — HEPARIN (PORCINE) 25000 UT/250ML-% IV SOLN
1200.0000 [IU]/h | INTRAVENOUS | Status: DC
  Administered 2019-12-23: 1200 [IU]/h via INTRAVENOUS
  Filled 2019-12-23: qty 250

## 2019-12-23 MED ORDER — ROSUVASTATIN CALCIUM 20 MG PO TABS: 20.0000 mg | ORAL_TABLET | Freq: Every day | ORAL | Status: DC

## 2019-12-23 NOTE — Progress Notes (Signed)
ANTICOAGULATION CONSULT NOTE - Initial Consult  Pharmacy Consult for heparin Indication: suspected pulmonary embolus  Allergies  Allergen Reactions  . Penicillins   . Predicort [Prednisolone]     Patient Measurements: Height: 5\' 3"  (160 cm) Weight: 90.7 kg (200 lb) IBW/kg (Calculated) : 52.4 Heparin Dosing Weight: 75kg  Vital Signs: Temp: 100.3 F (37.9 C) (06/29 0000) Temp Source: Rectal (06/29 0000) BP: 97/53 (06/29 0000) Pulse Rate: 108 (06/29 0000)  Labs: Recent Labs    12/22/19 2329  HGB 14.5  HCT 44.4  PLT 297  CREATININE 2.08*  TROPONINIHS 540*    Estimated Creatinine Clearance: 24.2 mL/min (A) (by C-G formula based on SCr of 2.08 mg/dL (H)).   Medical History: Past Medical History:  Diagnosis Date  . Diabetes mellitus without complication ([place])   . Hypertension     Assessment: 79yo female c/o fatigue and SOB that worsens with exertion; D-dimer found to be elevated, awaiting VQ scan, to begin heparin.  Goal of Therapy:  Heparin level 0.3-0.7 units/ml Monitor platelets by anticoagulation protocol: Yes   Plan:  Will give heparin 3000 units IV bolus x1 followed by gtt at 1200 units/hr and monitor heparin levels and CBC.  Wynona Neat, PharmD, BCPS  12/23/2019,1:24 AM

## 2019-12-23 NOTE — Progress Notes (Signed)
eLink Physician-Brief Progress Note Patient Name: Dawn Peterson DOB: [birth year] MRN: [id number]   Date of Service  12/23/2019  HPI/Events of Note  Patient requesting medication for insomnia.  eICU Interventions  Melatonin 3 mg Q HS prn insomnia ordered.        Kerry Kass Brooke Payes 12/23/2019, 10:58 PM

## 2019-12-23 NOTE — Progress Notes (Signed)
[place] for heparin Indication: suspected pulmonary embolus  Allergies  Allergen Reactions  . Predicort [Prednisolone] Other (See Comments)    Per daughter "makes her crazy"  . Penicillins Rash and Other (See Comments)    Childhood allergy, pt believes she broke out in her mouth    Patient Measurements: Height: 5\' 3"  (160 cm) Weight: 90.8 kg (200 lb 2.8 oz) IBW/kg (Calculated) : 52.4 Heparin Dosing Weight: 75kg  Vital Signs: Temp: 98.2 F (36.8 C) (06/29 1600) Temp Source: Oral (06/29 1600) BP: 105/60 (06/29 1600) Pulse Rate: 78 (06/29 1600)  Labs: Recent Labs    12/22/19 2329 12/23/19 0530 12/23/19 0644 12/23/19 1540  HGB 14.5 12.0  --   --   HCT 44.4 37.5  --   --   PLT 297 209  --   --   APTT  --  41* 67*  --   LABPROT  --  15.8*  --   --   INR  --  1.3*  --   --   HEPARINUNFRC  --   --   --  0.65  CREATININE 2.08*  --   --  1.26*  TROPONINIHS 540* 322*  --   --     Estimated Creatinine Clearance: 40 mL/min (A) (by C-G formula based on SCr of 1.26 mg/dL (H)).   Medical History: Past Medical History:  Diagnosis Date  . Diabetes mellitus without complication ([place])   . Hypertension     Assessment: 79yo female with PE confirmed on CT Angio. IV Heparin was stopped and IV tPA given at 0430 AM.   Repeat aPTT is <80 but done 15 minutes after the end of the tPA infusion. STAT repeat aPTT (1hr post) delayed and do not want to delay therapy. Hgb is down from 14.5 to 12 but within normal limits. Platelets are stable. INR is 1.3. No bleeding is noted other than some minor oozing at tPA site overnight. Discussed with Dr. Lamonte Sakai - ok to restart IV Heparin this AM  PM heparin level therapeutic at 0.65  Goal of Therapy:  Heparin level 0.3-0.7 units/ml Monitor platelets by anticoagulation protocol: Yes   Plan:  Continue heparin at 1200 units / hr Daily Heparin level and CBC while on therapy.   Anette Guarneri, PharmD Please  refer to [facility] for [place] numbers  12/23/2019,4:59 PM

## 2019-12-23 NOTE — Plan of Care (Addendum)
I was in the Emergency Department when I was made aware of this 79 year old woman presenting with worsening dyspnea with mild hypoxia due to her troponin of 540. Labs also significant for AKI, mild leukocytosis, and elevated D-dimer. On brief review, she is hypotensive and tachycardic with ECG showing a sinus rhythm with RBBB and acute RV strain pattern (RAD with [id number]). I performed a focused echocardiogram that showed severe RV dilation and hypokinesis with pressure/volume overload and probably underfilled LV - I am told she recently had a normal echo (TTE at [facility] from May read as normal RV). Overall, her presentation and these findings together are highly suggestive of massive PE, for which systemic thrombolytic therapy is the standard treatment in the absence of contraindications. This was conveyed to the primary ED provider, Eustaquio Maize, [place]. I am told she is being admitted by [facility], agree with MICU triage.

## 2019-12-23 NOTE — ED Notes (Signed)
MD Rose to room and states to hold off on the straight cath PA made aware and states to hold on straight cath at this time

## 2019-12-23 NOTE — Progress Notes (Signed)
[place] for heparin Indication: suspected pulmonary embolus  Allergies  Allergen Reactions  . Predicort [Prednisolone] Other (See Comments)    Per daughter "makes her crazy"  . Penicillins Rash and Other (See Comments)    Childhood allergy, pt believes she broke out in her mouth    Patient Measurements: Height: 5\' 3"  (160 cm) Weight: 90.8 kg (200 lb 2.8 oz) IBW/kg (Calculated) : 52.4 Heparin Dosing Weight: 75kg  Vital Signs: Temp: 97.4 F (36.3 C) (06/29 0444) Temp Source: Oral (06/29 0444) BP: 105/53 (06/29 0700) Pulse Rate: 82 (06/29 0715)  Labs: Recent Labs    12/22/19 2329 12/23/19 0530 12/23/19 0644  HGB 14.5 12.0  --   HCT 44.4 37.5  --   PLT 297 209  --   APTT  --  41* 67*  LABPROT  --  15.8*  --   INR  --  1.3*  --   CREATININE 2.08*  --   --   TROPONINIHS 540* 322*  --     Estimated Creatinine Clearance: 24.2 mL/min (A) (by C-G formula based on SCr of 2.08 mg/dL (H)).   Medical History: Past Medical History:  Diagnosis Date  . Diabetes mellitus without complication ([place])   . Hypertension     Assessment: 79yo female with PE confirmed on CT Angio. IV Heparin was stopped and IV tPA given at 0430 AM.   Repeat aPTT is <80 but done 15 minutes after the end of the tPA infusion. STAT repeat aPTT (1hr post) delayed and do not want to delay therapy. Hgb is down from 14.5 to 12 but within normal limits. Platelets are stable. INR is 1.3. No bleeding is noted other than some minor oozing at tPA site overnight. Discussed with Dr. Lamonte Sakai - ok to restart IV Heparin.   Goal of Therapy:  Heparin level 0.3-0.7 units/ml Monitor platelets by anticoagulation protocol: Yes   Plan:  Restart IV Heparin drip at 1200 units/hr.  Heparin level in 6-8 hours.  Daily Heparin level and CBC while on therapy.   Sloan Leiter, PharmD, BCPS, BCCCP Clinical Pharmacist Please refer to [facility] for [place] numbers  12/23/2019,7:28 AM

## 2019-12-23 NOTE — TOC Benefit Eligibility Note (Signed)
Transition of Care [facility]) Benefit Eligibility Note    Patient Details  Name: Dawn Peterson MRN: [id number] Date of Birth: 1941/01/31   Medication/Dose: Xarelto '15mg'$ . bid  20 mg daily and Eliquis '10mg'$ . 7 days and '5mg'$   twice a day.  Covered?: Yes  Tier: 3 Drug  Prescription Coverage [facility]erred [facility]: [facility]  Spoke with Person/Company/Phone Number:: Samule Ohm PH# [phone]  Co-Pay: $47.00  Prior Approval: No  Deductible: Met       [name] Phone Number: 12/23/2019, 9:23 AM

## 2019-12-23 NOTE — H&P (Addendum)
NAME:  Dawn Peterson, MRN:  [id number], DOB:  04-Apr-1941, LOS: 0 ADMISSION DATE:  12/22/2019, CONSULTATION DATE: 12/23/2019 REFERRING MD:  ED, CHIEF COMPLAINT: Shortness of breath for 1 week  Brief History   Patient is a 79 year old with 1 week of worsening dyspnea worse today prompting presentation to the emergency room.  History of present illness   Patient is a 79 year old with 1 week of worsening dyspnea worse today prompting presentation to the emergency room.  She also has a several week history poor appetite family believes that despite the fact that she has been vaccinated she developed Covid.  He also has fever she has brain fog. According to EMT the patient had a room air saturation of 92%.  She has had hypotension here in the emergency room.  Currently improving with fluid bolus.  Dr. Kalman Shan saw earlier this evening with elevated troponin, BNP and a D-dimer.  Echocardiogram done at bedside showed acute RV strain dilatation and underfilled left ventricle.  She previously had a normal echocardiogram in May. With an elevated creatinine of 2.08 explained to patient and her daughter that using CPAP, increased risk for kidney dysfunction secondary to IV dye.  Please see previous progress note for full discussion.  With his information associated CT angiogram along with TPA if pulmonary embolus is present.  Her troponin is 540 a D-dimer of 9.77.  Glucose is 345.  Coronavirus negative chest x-ray clear EKG with right bundle branch block.  CTA of chest with large bilateral almost occlusive Pes.   Past Medical History   . Diabetes mellitus without complication ([place])   . Hypertension      Significant Hospital Events   Admit to ICU  Consults:  Cardiology is PCCM  Procedures:  Echocardiogram, CTA chest  Significant Diagnostic Tests:  As above  Micro Data:  NA  Antimicrobials:  NA  Interim history/subjective:  NA  Objective   Blood pressure (!) 101/56, pulse 100, temperature  97.7 F (36.5 C), temperature source Oral, resp. rate (!) 24, height 5\' 3"  (1.6 m), weight 90.7 kg, SpO2 99 %.       No intake or output data in the 24 hours ending 12/23/19 0319 Filed Weights   12/22/19 2345  Weight: 90.7 kg    Examination: General: 79 year old beats in no acute distress. HENT: Within normal limits Lungs: Clear Cardiovascular: Regular Abdomen: Scaphoid benign nontender Extremities: No peripheral edema Neuro: Awake alert interactive nonfocal GU: N/A  Resolved Hospital Problem list   NA  Assessment & Plan:  1.  Pulmonary embolus: Large bilateral Pes.  Discussion with patient and her daughter, risk of life-threatening bleeding is been explained and she has decided to proceed with TPA therapy as therapy.   2.  Glycemia corrected we will treat intermittently and monitor  3.  Acute kidney injury with creatinine of 2: We will continue hydration.  Patient has had recent diarrhea.  4.  Recent decline in function predates contrast could be intermittent recent shortness of breath: Monitor while in hospital.    Best practice:  Diet: Regular Pain/Anxiety/Delirium protocol (if indicated): N/A VAP protocol (if indicated): N/A DVT prophylaxis: N/A, patient being treated for pulmonary GI prophylaxis: Pepcid Glucose control: Monitor glucose with as needed insulin Mobility: Bedrest Code Status: Full code Family Communication: Discussed with daughter Disposition: ICU for further therapy  Labs   CBC: Recent Labs  Lab 12/22/19 2329  WBC 15.9*  NEUTROABS 11.1*  HGB 14.5  HCT 44.4  MCV 88.8  PLT 297  Basic Metabolic Panel: Recent Labs  Lab 12/22/19 2329  NA 137  K 3.9  CL 103  CO2 20*  GLUCOSE 345*  BUN 30*  CREATININE 2.08*  CALCIUM 9.8   GFR: Estimated Creatinine Clearance: 24.2 mL/min (A) (by C-G formula based on SCr of 2.08 mg/dL (H)). Recent Labs  Lab 12/22/19 2329 12/23/19 0144  WBC 15.9*  --   LATICACIDVEN  --  2.2*    Liver  Function Tests: Recent Labs  Lab 12/22/19 2329  AST 23  ALT 16  ALKPHOS 63  BILITOT 0.6  PROT 7.5  ALBUMIN 3.3*   No results for input(s): LIPASE, AMYLASE in the last 168 hours. No results for input(s): AMMONIA in the last 168 hours.  ABG    Component Value Date/Time   TCO2 25 08/19/2008 1758     Coagulation Profile: No results for input(s): INR, PROTIME in the last 168 hours.  Cardiac Enzymes: No results for input(s): CKTOTAL, CKMB, CKMBINDEX, TROPONINI in the last 168 hours.  HbA1C: No results found for: HGBA1C  CBG: No results for input(s): GLUCAP in the last 168 hours.  Review of Systems:   Diarrhea intermittently for 2 months, with decline in mental acuity and increased sedentary state  Past Medical History  She,  has a past medical history of Diabetes mellitus without complication ([place]) and Hypertension.   Surgical History   unknown  Social History   reports that she has never smoked. She does not have any smokeless tobacco history on file. She reports previous alcohol use.   Family History   Her family history is not on file.   Allergies Allergies  Allergen Reactions  . Penicillins   . Predicort [Prednisolone]      Home Medications  Prior to Admission medications   Not on File     Critical care time: Over 45 minutes was spent bedside evaluation chart review discussion with patient, her daughter and critical care planning

## 2019-12-23 NOTE — Progress Notes (Addendum)
eLink Physician-Brief Progress Note Patient Name: Dawn Peterson DOB: Nov 06, 1940 MRN: [id number]   Date of Service  12/23/2019  HPI/Events of Note  New patient evaluation. 31F who presented with dyspnea and was found to have massive PE confirmed by CTA who subsequently received tPA and heparinization. Now in ICU for ongoing monitoring. She is doing well currently, with stable vitals (BP 110/65, HR 82 [SR], SpO2 99% on 2L [place]). She is alert.   eICU Interventions  # Pulm: - tPA currently infusing. Heparin on hold while tPA infusing. - After tPA has finished infusing, will need heparin drip restarted for ongoing mgmt of PE once aPTT < 65 secs.  - First aPTT post-alteplase has been ordered with RN communication to report the result to MD. - Wean supplemental O2 as tolerated.  # Cardiac: - BP seems to have improved since tPA was started. CTM. - Severe RV enlargement on CTA and Echo from PE.  # Endo: - ISS.      Intervention Category Evaluation Type: New Patient Evaluation  [name] 12/23/2019, 6:08 AM

## 2019-12-23 NOTE — Plan of Care (Signed)
79 yo with little to no PMH except since March; never hospitalized except for child birth. Husb & dtr are concerned there are underlying conditions that are elusive.  Pt has not seen a specialist outside [facility] and PCP has been managing care.    Areas of concern:  Diarrhea since March controlled by Immodium each night.  Husb states have had to give it TID on occasion to keep a 'normal' bowel frequency of twice daily diarrhea.  No GI referral, no colonoscopy.  Unmanageable blood sugar and A1C (on admission 9.1).  PCP changed from Metformin to [place] d/t elevated A1C.  Husband endorses checks her sugar before breakfast and at bedtime.  Often is 300 & over.  States he is counting calories but has not been provided diet plans, etc.  Has not seen an endocrinologist.   Lethargy & choosing to stay in bed since March.  Per family is usually running around taking care of everyone.    Significant ult. PCP has scheduled MRI head for the patient in July.  However with recent AKI & contrast from CTA this visit, may need to revisit.   memory issues (short term).  Family & patient endorses abrupt changes since March 2021.  PCP prescribed Aricept 'to see if that helped'.  Had [facility] @ [place]- negative for anything acute.  No neurology cons Patient recognizes abrupt, significant memory & energy level changes and is concerned.  She and her family want to uncover the root cause and make a plan.  Any and all reccs are appreciated.

## 2019-12-23 NOTE — Progress Notes (Signed)
NAME:  MALAVIKA LIRA, MRN:  [id number], DOB:  17-Sep-1940, LOS: 0 ADMISSION DATE:  12/22/2019, CONSULTATION DATE: 12/23/2019 REFERRING MD:  ED, CHIEF COMPLAINT: Shortness of breath for 1 week  Brief History   Patient is a 79 year old with 1 week of worsening dyspnea worse today prompting presentation to the emergency room.  History of present illness   Patient is a 79 year old with 1 week of worsening dyspnea worse today prompting presentation to the emergency room.  She also has a several week history poor appetite family believes that despite the fact that she has been vaccinated she developed Covid.  He also has fever she has brain fog. According to EMT the patient had a room air saturation of 92%.  She has had hypotension here in the emergency room.  Currently improving with fluid bolus.  Dr. Kalman Shan saw earlier this evening with elevated troponin, BNP and a D-dimer.  Echocardiogram done at bedside showed acute RV strain dilatation and underfilled left ventricle.  She previously had a normal echocardiogram in May. With an elevated creatinine of 2.08 explained to patient and her daughter that using CPAP, increased risk for kidney dysfunction secondary to IV dye.  Please see previous progress note for full discussion.  With his information associated CT angiogram along with TPA if pulmonary embolus is present.  Her troponin is 540 a D-dimer of 9.77.  Glucose is 345.  Coronavirus negative chest x-ray clear EKG with right bundle branch block.  CTA of chest with large bilateral almost occlusive Pes.   Past Medical History   . Diabetes mellitus without complication ([place])   . Hypertension      Significant Hospital Events   Admit to ICU  Consults:  Cardiology is PCCM  Procedures:  Echocardiogram, CTA chest  Significant Diagnostic Tests:  As above  Micro Data:  NA  Antimicrobials:  NA  Interim history/subjective:   Received t-PA overnight - much improved hemodynamically.  Improved oxygenation.   Objective   Blood pressure 111/60, pulse 80, temperature 98 F (36.7 C), resp. rate (!) 22, height 5\' 3"  (1.6 m), weight 90.8 kg, SpO2 98 %.        Intake/Output Summary (Last 24 hours) at 12/23/2019 0942 Last data filed at 12/23/2019 0800 Gross per 24 hour  Intake 1946.68 ml  Output 250 ml  Net 1696.68 ml   Filed Weights   12/22/19 2345 12/23/19 0500  Weight: 90.7 kg 90.8 kg    Examination: General: Elderly woman, laying comfortably in bed HENT: Oropharynx clear, pupils equal Lungs: Clear bilaterally Cardiovascular: Regular, distant, no murmur Abdomen: Soft, nondistended positive bowel sounds Extremities: No edema Neuro: Awake, alert, appropriate, interacting following commands, moves all extremities.  No deficits   Resolved Hospital Problem list   NA  Assessment & Plan:  1.  Large bilateral pulmonary emboli with associated hemodynamic compromise, hypoxic respiratory failure.  Based on discussions with family overnight TPA was administered. t-PA completed, will initiate heparin gtt this am 6/29 Follow in the ICU through the morning. If no evidence bleeding, instability then can consider transfer to floor bed B LE doppler US   2.  Acute kidney injury, possibly due to hypovolemia. Also received contrast dye for CT-PA IVF resuscitation Follow UOP and S Cr   3. Diabetes with hyperglycemia CBG's and SSI as ordered Home Farxiga on hold  4. Hypertension Home amlodipine-valsartan, chlorthalidone on hold   5. Hyperlipidemia Restart home crestor   6. Depression / ? Dementia Restart home arricept, lexapro  Best practice:  Diet: Regular Pain/Anxiety/Delirium protocol (if indicated): N/A VAP protocol (if indicated): N/A DVT prophylaxis: N/A, patient being treated for PE GI prophylaxis: Pepcid Glucose control: Monitor glucose with as needed insulin Mobility: Bedrest Code Status: Full code Family Communication: Discussed with patient  6/29 Disposition: ICU for further therapy  Labs   CBC: Recent Labs  Lab 12/22/19 2329 12/23/19 0530  WBC 15.9* 10.7*  NEUTROABS 11.1*  --   HGB 14.5 12.0  HCT 44.4 37.5  MCV 88.8 88.9  PLT 297 856    Basic Metabolic Panel: Recent Labs  Lab 12/22/19 2329  NA 137  K 3.9  CL 103  CO2 20*  GLUCOSE 345*  BUN 30*  CREATININE 2.08*  CALCIUM 9.8   GFR: Estimated Creatinine Clearance: 24.2 mL/min (A) (by C-G formula based on SCr of 2.08 mg/dL (H)). Recent Labs  Lab 12/22/19 2329 12/23/19 0144 12/23/19 0530  WBC 15.9*  --  10.7*  LATICACIDVEN  --  2.2*  --     Liver Function Tests: Recent Labs  Lab 12/22/19 2329  AST 23  ALT 16  ALKPHOS 63  BILITOT 0.6  PROT 7.5  ALBUMIN 3.3*   No results for input(s): LIPASE, AMYLASE in the last 168 hours. No results for input(s): AMMONIA in the last 168 hours.  ABG    Component Value Date/Time   TCO2 25 08/19/2008 1758     Coagulation Profile: Recent Labs  Lab 12/23/19 0530  INR 1.3*    Cardiac Enzymes: No results for input(s): CKTOTAL, CKMB, CKMBINDEX, TROPONINI in the last 168 hours.  HbA1C: Hgb A1c MFr Bld  Date/Time Value Ref Range Status  12/23/2019 05:30 AM 9.1 (H) 4.8 - 5.6 % Final    Comment:    (NOTE) Pre diabetes:          5.7%-6.4%  Diabetes:              >6.4%  Glycemic control for   <7.0% adults with diabetes     CBG: Recent Labs  Lab 12/23/19 0741  GLUCAP 177*     Critical care time: 32 minutes     Baltazar Apo, MD, PhD 12/23/2019, 9:43 AM [facility] [phone] or if no answer [phone]

## 2019-12-23 NOTE — ED Notes (Signed)
Date and time results received: 12/23/19 0233 (use smartphrase ".now" to insert current time) Lab  Test: Lactic Acid  Critical Value: 2.2  Name of Provider Notified  Orders Received? Or Actions Taken?:

## 2019-12-23 NOTE — Progress Notes (Signed)
Pt unable fall asleep, and slept poorly last night also. Attempted Tylenol and comfort measure earlier with no relief. Pt is requesting something to help her sleep. [place] notified.

## 2019-12-23 NOTE — Progress Notes (Signed)
Bilateral lower extremity venous duplex complete. Please see CV proc tab for preliminary results.  Critical findings relayed to patients RN @ 14:45. Lita Mains- RDMS, RVT 3:07 PM  12/23/2019

## 2019-12-23 NOTE — ED Notes (Signed)
RN spoke with MD Mariane Masters face to face who states not to place foley at this time . RN will continue to monitor

## 2019-12-23 NOTE — Progress Notes (Signed)
Full H&P to follow.  I had a lengthy discussion with the patient and her daughter at bedside.  They understand that it appears she has had a massive pulmonary embolus based on echocardiogram.  They also understand that her kidney function is low and may be adversely affected by CT angiogram to definitively diagnose a pulmonary embolus.  Of also explained to them that I would not pursue CTA of the lungs unless she wanted to receive TPA for pulmonary embolus should she in fact have 1.  Also discussed the risks of TPA with the patient and her daughter.  They have decided to go ahead and get a CT angiogram of the lung understanding the potential adverse risks to her kidney function and should this be diagnostic of pulmonary embolus would like to pursue TPA.  We will proceed with CTA of the lungs.

## 2019-12-24 ENCOUNTER — Other Ambulatory Visit: Payer: Self-pay

## 2019-12-24 LAB — BASIC METABOLIC PANEL
Anion gap: 9 (ref 5–15)
BUN: 21 mg/dL (ref 8–23)
CO2: 16 mmol/L — ABNORMAL LOW (ref 22–32)
Calcium: 8.4 mg/dL — ABNORMAL LOW (ref 8.9–10.3)
Chloride: 113 mmol/L — ABNORMAL HIGH (ref 98–111)
Creatinine, Ser: 1.15 mg/dL — ABNORMAL HIGH (ref 0.44–1.00)
GFR calc Af Amer: 53 mL/min — ABNORMAL LOW (ref >60)
GFR calc non Af Amer: 46 mL/min — ABNORMAL LOW (ref >60)
Glucose, Bld: 167 mg/dL — ABNORMAL HIGH (ref 70–99)
Potassium: 3.9 mmol/L (ref 3.5–5.1)
Sodium: 138 mmol/L (ref 135–145)

## 2019-12-24 LAB — CBC
HCT: 33 % — ABNORMAL LOW (ref 36.0–46.0)
Hemoglobin: 10.5 g/dL — ABNORMAL LOW (ref 12.0–15.0)
MCH: 28.5 pg (ref 26.0–34.0)
MCHC: 31.8 g/dL (ref 30.0–36.0)
MCV: 89.7 fL (ref 80.0–100.0)
Platelets: 160 10*3/uL (ref 150–400)
RBC: 3.68 MIL/uL — ABNORMAL LOW (ref 3.87–5.11)
RDW: 13.5 % (ref 11.5–15.5)
WBC: 9.5 10*3/uL (ref 4.0–10.5)
nRBC: 0 % (ref 0.0–0.2)

## 2019-12-24 LAB — GLUCOSE, CAPILLARY
Glucose-Capillary: 174 mg/dL — ABNORMAL HIGH (ref 70–99)
Glucose-Capillary: 178 mg/dL — ABNORMAL HIGH (ref 70–99)
Glucose-Capillary: 161 mg/dL — ABNORMAL HIGH (ref 70–99)

## 2019-12-24 LAB — HEPARIN LEVEL (UNFRACTIONATED): Heparin Unfractionated: 0.64 IU/mL (ref 0.30–0.70)

## 2019-12-24 MED ORDER — APIXABAN 5 MG PO TABS: 5.0000 mg | ORAL_TABLET | Freq: Two times a day (BID) | ORAL | Status: DC

## 2019-12-24 MED ORDER — FLUTICASONE PROPIONATE 50 MCG/ACT NA SUSP
2.0000 | Freq: Two times a day (BID) | NASAL | Status: DC
  Administered 2019-12-24 – 2019-12-25 (×4): 2 via NASAL
  Filled 2019-12-24: qty 16

## 2019-12-24 MED ORDER — APIXABAN 5 MG PO TABS
10.0000 mg | ORAL_TABLET | Freq: Two times a day (BID) | ORAL | Status: DC
  Administered 2019-12-24 – 2019-12-26 (×5): 10 mg via ORAL
  Filled 2019-12-24 (×5): qty 2

## 2019-12-24 NOTE — Progress Notes (Signed)
  RD consulted for nutrition education regarding diabetes. Spoke with patient and 2 family members at bedside. Patient's blood sugars have been high at home since March and she requested some more information regarding her diet. Discussed CHO recommendations with them and answered their questions. Recommend outpatient diet education. Patient has had outpatient education with a RD and she plans to follow-up with the RD soon.   Lab Results  Component Value Date   HGBA1C 9.1 (H) 12/23/2019    RD provided "Heart Healthy Consistent Carbohydrate Nutrition Therapy" handout from the Academy of Nutrition and Dietetics. Provided list of carbohydrates and recommended serving sizes of common foods.  Discussed importance of controlled and consistent carbohydrate intake throughout the day. Provided examples of ways to balance meals/snacks and encouraged intake of high-fiber, whole grain complex carbohydrates. Teach back method used.  Expect good compliance.  Body mass index is 36.48 kg/m. Pt meets criteria for obesity based on current BMI.  Patient reports recent poor intake and minimal weight loss. Nutrition focused physical exam completed.  No muscle or subcutaneous fat depletion noticed. She has been eating well since admission.   Current diet order is CHO modified, patient is consuming approximately 75% of meals at this time. Labs and medications reviewed. No further nutrition interventions warranted at this time. RD contact information provided. If additional nutrition issues arise, please re-consult RD.   Lucas Mallow, RD, LDN, CNSC Please refer to [facility] for contact information.

## 2019-12-24 NOTE — Progress Notes (Signed)
NAME:  Dawn Peterson, MRN:  [id number], DOB:  1940/07/07, LOS: 1 ADMISSION DATE:  12/22/2019, CONSULTATION DATE: 12/23/2019 REFERRING MD:  ED, CHIEF COMPLAINT: Shortness of breath for 1 week  Brief History   Patient is a 79 year old with 1 week of worsening dyspnea worse today prompting presentation to the emergency room.  History of present illness   Patient is a 79 year old with 1 week of worsening dyspnea worse today prompting presentation to the emergency room.  She also has a several week history poor appetite family believes that despite the fact that she has been vaccinated she developed Covid.  He also has fever she has brain fog. According to EMT the patient had a room air saturation of 92%.  She has had hypotension here in the emergency room.  Currently improving with fluid bolus.  Dr. Kalman Shan saw earlier this evening with elevated troponin, BNP and a D-dimer.  Echocardiogram done at bedside showed acute RV strain dilatation and underfilled left ventricle.  She previously had a normal echocardiogram in May. With an elevated creatinine of 2.08 explained to patient and her daughter that using CPAP, increased risk for kidney dysfunction secondary to IV dye.  Please see previous progress note for full discussion.  With his information associated CT angiogram along with TPA if pulmonary embolus is present.  Her troponin is 540 a D-dimer of 9.77.  Glucose is 345.  Coronavirus negative chest x-ray clear EKG with right bundle branch block.  CTA of chest with large bilateral almost occlusive Pes.   Past Medical History    Diabetes mellitus without complication ([place])    Hypertension     Significant Hospital Events   Admit to ICU  Consults:  Cardiology is PCCM  Procedures:  Echocardiogram, CTA chest  Significant Diagnostic Tests:  BLE dopplers 6/29> -indeterminate chronicity DVT R common femoral vein, R proximal profunda vein, R popliteal vein, R posterior tibial veins, R peroneal  vein   Micro Data:  NA  Antimicrobials:  NA  Interim history/subjective:  Transitioned to heparin gtt 6/29 PM   Objective   Blood pressure (!) 104/41, pulse 77, temperature (!) 97.4 F (36.3 C), temperature source Oral, resp. rate (!) 23, height 5\' 3"  (1.6 m), weight 93.4 kg, SpO2 98 %.        Intake/Output Summary (Last 24 hours) at 12/24/2019 0748 Last data filed at 12/24/2019 0700 Gross per 24 hour  Intake 4311.5 ml  Output 1450 ml  Net 2861.5 ml   Filed Weights   12/22/19 2345 12/23/19 0500 12/24/19 0500  Weight: 90.7 kg 90.8 kg 93.4 kg    Examination: General: WD older adult F. Reclined in bed NAD eating breakfast  HENT: NCAT pink mmm anicteric sclera  Lungs: CTA bilaterally. Symmetrical chest expansion, unlabored on RA  Cardiovascular: RRR s1s2 no rgm cap refill < 3 seconds  Abdomen: Soft round ndnt + bowel sounds  Extremities: No obvious joint deformity, no cyanosis or clubbing.  Neuro: AAO x4 following commands no focal deficit    Resolved Hospital Problem list   Hypoxia Hemodynamic instability   Assessment & Plan:   Large bilateral PE, associated hemodynamic compromise and hypoxic respiratory failure -- improving  -s/p tPA. Transitioned to heparin 6/29 Large Bilateral PE  -RLE DVT, indeterminate chronicity.  -discussed pro/cons and cost of eliquis with patient vs coumadin -- she feels cost (projected to be $47/mo) is acceptable and would prefer to try eliquis P -No evidence of bleeding, will transfer to floor -transition to  eliquis this morning (6/30 AM)  AKI, improving -hypovolemia, possible contrast induced with CTA chest P -trend renal indices  -favor eliquis given age + mild AKI   Diabetes with hyperglycemia -SSI -The patient expresses desire for OP follow up regarding her DM management. Currently sees PCP for management. Will engage DM coordinator while inpatient.   Hypertension -Home amlodipine valsartan   Hyperlipidemia -Crestor     Hx Depression, well controlled Hx Possible Dementia -Home lexapro, arricept     Best practice:  Diet: Carb mod  Pain/Anxiety/Delirium protocol (if indicated): N/A VAP protocol (if indicated): N/A DVT prophylaxis: Heparin gtt > eliquis  GI prophylaxis: Pepcid Glucose control: Monitor glucose with as needed insulin Mobility: Bedrest Code Status: Full code Family Communication: Discussed with patient 6/29 Disposition: Stable for transfer to floor   Labs   CBC: Recent Labs  Lab 12/22/19 2329 12/23/19 0530 12/24/19 0259  WBC 15.9* 10.7* 9.5  NEUTROABS 11.1*  --   --   HGB 14.5 12.0 10.5*  HCT 44.4 37.5 33.0*  MCV 88.8 88.9 89.7  PLT 297 209 280    Basic Metabolic Panel: Recent Labs  Lab 12/22/19 2329 12/23/19 1540 12/24/19 0259  NA 137 138 138  K 3.9 3.3* 3.9  CL 103 111 113*  CO2 20* 19* 16*  GLUCOSE 345* 192* 167*  BUN 30* 26* 21  CREATININE 2.08* 1.26* 1.15*  CALCIUM 9.8 8.4* 8.4*   GFR: Estimated Creatinine Clearance: 44.5 mL/min (A) (by C-G formula based on SCr of 1.15 mg/dL (H)). Recent Labs  Lab 12/22/19 2329 12/23/19 0144 12/23/19 0530 12/23/19 2302 12/24/19 0259  WBC 15.9*  --  10.7*  --  9.5  LATICACIDVEN  --  2.2*  --  1.4  --     Liver Function Tests: Recent Labs  Lab 12/22/19 2329  AST 23  ALT 16  ALKPHOS 63  BILITOT 0.6  PROT 7.5  ALBUMIN 3.3*   No results for input(s): LIPASE, AMYLASE in the last 168 hours. No results for input(s): AMMONIA in the last 168 hours.  ABG    Component Value Date/Time   TCO2 25 08/19/2008 1758     Coagulation Profile: Recent Labs  Lab 12/23/19 0530  INR 1.3*    Cardiac Enzymes: No results for input(s): CKTOTAL, CKMB, CKMBINDEX, TROPONINI in the last 168 hours.  HbA1C: Hgb A1c MFr Bld  Date/Time Value Ref Range Status  12/23/2019 05:30 AM 9.1 (H) 4.8 - 5.6 % Final    Comment:    (NOTE) Pre diabetes:          5.7%-6.4%  Diabetes:              >6.4%  Glycemic control for    <7.0% adults with diabetes     CBG: Recent Labs  Lab 12/23/19 0741 12/23/19 1137 12/23/19 1635 12/23/19 2045 12/24/19 0736  GLUCAP 177* 196* 166* 216* 174*     Eliseo Gum MSN, AGACNP-BC [place] [phone] If no answer, [phone] 12/24/2019, 7:48 AM

## 2019-12-24 NOTE — Progress Notes (Signed)
Inpatient Diabetes Program Recommendations  AACE/ADA: New Consensus Statement on Inpatient Glycemic Control (2015)  Target Ranges:  Prepandial:   less than 140 mg/dL      Peak postprandial:   less than 180 mg/dL (1-2 hours)      Critically ill patients:  140 - 180 mg/dL   Lab Results  Component Value Date   GLUCAP 174 (H) 12/24/2019   HGBA1C 9.1 (H) 12/23/2019    Review of Glycemic Control  Diabetes history: DM2 Outpatient Diabetes medications: Farxiga 10 mg QD Current orders for Inpatient glycemic control: Novolog 0-9 units tidwc  HgbA1C - 9.1%  Inpatient Diabetes Program Recommendations:     Add Novolog HS correction  To optimize glycemic control after discharge, lifestyle modification with small amount of weight loss and exercise.  May benefit from OP Diabetes Education consult.  Ordered RD consult for diet education.  Continue to follow while inpatient.  Thank you. Lorenda Peck, RD, LDN, CDE Inpatient Diabetes Coordinator [phone]

## 2019-12-24 NOTE — Progress Notes (Cosign Needed Addendum)
FOR EDUCATIONAL USE ONLY - NOT PART OF MEDICAL RECORD    NAME:  Dawn Peterson, MRN:  [id number], DOB:  1941/03/10, LOS: 1 ADMISSION DATE:  12/22/2019, CONSULTATION DATE:  12/23/19 REFERRING MD:  EDP CHIEF COMPLAINT:  Dyspnea   Brief History   79 year old female with PMH significant for DM and HTN who presented with one week history of worsening dyspnea, found to have bilateral occlusive PE's with evidence of right heart strain on 6/28. She was treated with thrombolytics and anticoagulation.  History of present illness   Dawn Peterson is a 79 year old female with a PMH significant for DM and HTN who presented to the ED on 6/28 for worsening dyspnea. She states that the last week of May, she flew to [place] for a graduation and noticed that she started to feel fatigued and lethargic the next week. She endorses several weeks of worsening dyspnea. When she arrived to the ED she had developed hypotension in the ED which improved with fluid bolus. She had elevated troponin (540), D-dimer (9.77). Bedside echocardiogram demonstrated acute RV strain and under filled LV. CTA Chest with contrast revealed bilateral occlusive pulmonary emboli. She received thrombolytics (tPA), was started on a heparin drip, and was admitted to ICU for continued monitoring.   Past Medical History  DM  HTN  Significant Hospital Events   6/29 Admit to ICU  Consults:  PCCM Cardiology  Procedures:  6/29 tPA + heparin for massive PE  Significant Diagnostic Tests:  6/29 CT Angio Chest >> Large bilateral pulmonary emboli with CT evidence of RHS.  6/29 LE [place] >> Findings consistent with age indeterminate deep vein thrombosis involving the right common femoral vein, right proximal profunda vein, right popliteal vein, right posterior tibial veins, and right peroneal veins.   6/28 CXR >> No acute findings  Micro Data:  6/28 SARS CoV2 >> Negative  Antimicrobials:     Interim history/subjective:  Difficulty  sleeping overnight and was given melatonin. Hemodynamically stable and afebrile overnight. Adequate urine output and two documented BM's.  Objective   Blood pressure (!) 104/41, pulse 77, temperature 98 F (36.7 C), temperature source Oral, resp. rate (!) 23, height 5\' 3"  (1.6 m), weight 93.4 kg, SpO2 98 %.        Intake/Output Summary (Last 24 hours) at 12/24/2019 0732 Last data filed at 12/24/2019 0700 Gross per 24 hour  Intake 4311.5 ml  Output 1450 ml  Net 2861.5 ml   Filed Weights   12/22/19 2345 12/23/19 0500 12/24/19 0500  Weight: 90.7 kg 90.8 kg 93.4 kg    Examination: General: Elderly white female resting in bed with eyes open, interacts appropriately HENT: NCAT. MMM. Non-icteric sclera Lungs: CTAB. No accessory muscle use. Equal chest rise and fall Cardiovascular: Normal S1/S2. No murmurs, rubs, gallops. Cap refill immediate. BLE DP pulses 2+ palpable Abdomen: Soft, non-tender. BS present Extremities: Normal muscle tone and bulk. No obvious joint deformities. RLE no edema, erythema, or warmness.  Neuro: Follows commands. PERRL. BLE with 5/5 motor movement and normal sensation.  GU: Defer  Resolved Hospital Problem list     Assessment & Plan:  Large bilateral pulmonary emboli Extensive RLE DVT - Right proximal profunda, right popliteal, peroneal, CFV, and PT.  Likely provoked from recent air travel to [place] last month. She received thrombolytic and continues on heparin drip. -Start Eliquis, 3 months duration and transition off heparin drip with first dose -With extensive RLE DVT burden -  Role  for thrombectomy? No neurovascular compromise -OOB as tolerated once off of heparin drip -Telemetry monitoring  Acute kidney injury Improving. Likely secondary to hypovolemia and hypotension in setting of bilateral occlusive pulmonary emboli. Her initial Cr was 2.0, now 1.15.  -Continue to trend renal indices -Minimize nephrotoxic medications -Continue hydration with  mIVF -Favor eliquis while awaiting complete renal recovery  Diabetes with hyperglycemia Hgb A1c on admission was 9.1. Recently switched from metformin to [place].  -Follow-up with PCP on discharge -Continue SSI while inpatient, hold farxiga -BG checks ACHS  History of HTN Takes chlorthalidone and exforge. Blood pressures have been normotensive on soft side.  -Continue to hold home anti-hypertensives -Consider resuming once blood pressures have stabilized  Hyperlipidemia -Continue home rosuvastatin  Best practice:  Diet: Carb modified Pain/Anxiety/Delirium protocol (if indicated): Tylenol, melatonin VAP protocol (if indicated): N/A DVT prophylaxis: Heparin drip GI prophylaxis: N/A Glucose control: SSI Mobility: OOB as tolerated Code Status: Full Family Communication: Discussed with patient Disposition: Transfer to telemetry floor  Labs   CBC: Recent Labs  Lab 12/22/19 2329 12/23/19 0530 12/24/19 0259  WBC 15.9* 10.7* 9.5  NEUTROABS 11.1*  --   --   HGB 14.5 12.0 10.5*  HCT 44.4 37.5 33.0*  MCV 88.8 88.9 89.7  PLT 297 209 510    Basic Metabolic Panel: Recent Labs  Lab 12/22/19 2329 12/23/19 1540 12/24/19 0259  NA 137 138 138  K 3.9 3.3* 3.9  CL 103 111 113*  CO2 20* 19* 16*  GLUCOSE 345* 192* 167*  BUN 30* 26* 21  CREATININE 2.08* 1.26* 1.15*  CALCIUM 9.8 8.4* 8.4*   GFR: Estimated Creatinine Clearance: 44.5 mL/min (A) (by C-G formula based on SCr of 1.15 mg/dL (H)). Recent Labs  Lab 12/22/19 2329 12/23/19 0144 12/23/19 0530 12/23/19 2302 12/24/19 0259  WBC 15.9*  --  10.7*  --  9.5  LATICACIDVEN  --  2.2*  --  1.4  --     Liver Function Tests: Recent Labs  Lab 12/22/19 2329  AST 23  ALT 16  ALKPHOS 63  BILITOT 0.6  PROT 7.5  ALBUMIN 3.3*   No results for input(s): LIPASE, AMYLASE in the last 168 hours. No results for input(s): AMMONIA in the last 168 hours.  ABG    Component Value Date/Time   TCO2 25 08/19/2008 1758      Coagulation Profile: Recent Labs  Lab 12/23/19 0530  INR 1.3*    Cardiac Enzymes: No results for input(s): CKTOTAL, CKMB, CKMBINDEX, TROPONINI in the last 168 hours.  HbA1C: Hgb A1c MFr Bld  Date/Time Value Ref Range Status  12/23/2019 05:30 AM 9.1 (H) 4.8 - 5.6 % Final    Comment:    (NOTE) Pre diabetes:          5.7%-6.4%  Diabetes:              >6.4%  Glycemic control for   <7.0% adults with diabetes     CBG: Recent Labs  Lab 12/23/19 0512 12/23/19 0741 12/23/19 1137 12/23/19 1635 12/23/19 2045  GLUCAP 204* 177* 196* 166* 216*    Review of Systems:   Negative for dyspnea, chest pain, palpitations Positive for diarrhea, chronic in nature on immodium Negative for RLE pain, loss of sensation  Past Medical History  She,  has a past medical history of Diabetes mellitus without complication ([place]) and Hypertension.   Surgical History   History reviewed. No pertinent surgical history.   Social History   reports that she has  never smoked. She does not have any smokeless tobacco history on file. She reports previous alcohol use.   Family History   Her family history is not on file.   Allergies Allergies  Allergen Reactions  . Predicort [Prednisolone] Other (See Comments)    Per daughter "makes her crazy"  . Penicillins Rash and Other (See Comments)    Childhood allergy, pt believes she broke out in her mouth     Home Medications  Prior to Admission medications   Medication Sig Start Date End Date Taking? Authorizing Provider  amLODipine-valsartan (EXFORGE) 5-320 MG tablet Take 1 tablet by mouth daily. 11/30/19  Yes [provider]  aspirin EC 81 MG tablet Take 81 mg by mouth daily. Swallow whole.   Yes [provider]  chlorthalidone (HYGROTON) 25 MG tablet Take 12.5 mg by mouth daily. 11/30/19  Yes [provider]  cholecalciferol (VITAMIN D3) 25 MCG (1000 UNIT) tablet Take 1,000 Units by mouth daily.   Yes [provider]  donepezil (ARICEPT) 10 MG tablet Take 10 mg by mouth daily. 12/01/19  Yes [provider]  escitalopram (LEXAPRO) 10 MG tablet Take 10 mg by mouth daily. 11/04/19  Yes [provider]  FARXIGA 10 MG TABS tablet Take 10 mg by mouth daily. 12/12/19  Yes [provider]  loperamide (IMODIUM) 2 MG capsule Take 2 mg by mouth at bedtime.   Yes [provider]  meloxicam (MOBIC) 7.5 MG tablet Take 7.5 mg by mouth daily.   Yes [provider]  rosuvastatin (CRESTOR) 20 MG tablet Take 20 mg by mouth at bedtime. 11/03/19  Yes [provider]     Critical care time: 60 minutes    Electronically signed by: Myrle Sheng, [place], 12/24/19 [phone]

## 2019-12-24 NOTE — Discharge Instructions (Signed)
Information on my medicine - ELIQUIS (apixaban)  This medication education was reviewed with me or my healthcare representative as part of my discharge preparation.   Why was Eliquis prescribed for you? Eliquis was prescribed to treat blood clots that may have been found in the veins of your legs (deep vein thrombosis) or in your lungs (pulmonary embolism) and to reduce the risk of them occurring again.  What do You need to know about Eliquis ? The starting dose is 10 mg (two 5 mg tablets) taken TWICE daily for the FIRST SEVEN (7) DAYS, then on 7/7  the dose is reduced to ONE 5 mg tablet taken TWICE daily.  Eliquis may be taken with or without food.   Try to take the dose about the same time in the morning and in the evening. If you have difficulty swallowing the tablet whole please discuss with your pharmacist how to take the medication safely.  Take Eliquis exactly as prescribed and DO NOT stop taking Eliquis without talking to the doctor who prescribed the medication.  Stopping may increase your risk of developing a new blood clot.  Refill your prescription before you run out.  After discharge, you should have regular check-up appointments with your healthcare provider that is prescribing your Eliquis.    What do you do if you miss a dose? If a dose of ELIQUIS is not taken at the scheduled time, take it as soon as possible on the same day and twice-daily administration should be resumed. The dose should not be doubled to make up for a missed dose.  Important Safety Information A possible side effect of Eliquis is bleeding. You should call your healthcare provider right away if you experience any of the following: ? Bleeding from an injury or your nose that does not stop. ? Unusual colored urine (red or dark brown) or unusual colored stools (red or black). ? Unusual bruising for unknown reasons. ? A serious fall or if you hit your head (even if there is no bleeding).  Some  medicines may interact with Eliquis and might increase your risk of bleeding or clotting while on Eliquis. To help avoid this, consult your healthcare provider or pharmacist prior to using any new prescription or non-prescription medications, including herbals, vitamins, non-steroidal anti-inflammatory drugs (NSAIDs) and supplements.  This website has more information on Eliquis (apixaban): http://www.eliquis.com/eliquis/home

## 2019-12-25 DIAGNOSIS — I2609 Other pulmonary embolism with acute cor pulmonale: Principal | ICD-10-CM

## 2019-12-25 LAB — CBC
HCT: 30.7 % — ABNORMAL LOW (ref 36.0–46.0)
Hemoglobin: 9.9 g/dL — ABNORMAL LOW (ref 12.0–15.0)
MCH: 28.5 pg (ref 26.0–34.0)
MCHC: 32.2 g/dL (ref 30.0–36.0)
MCV: 88.5 fL (ref 80.0–100.0)
Platelets: 159 10*3/uL (ref 150–400)
RBC: 3.47 MIL/uL — ABNORMAL LOW (ref 3.87–5.11)
RDW: 13.6 % (ref 11.5–15.5)
WBC: 8.5 10*3/uL (ref 4.0–10.5)
nRBC: 0 % (ref 0.0–0.2)

## 2019-12-25 LAB — BASIC METABOLIC PANEL
Anion gap: 7 (ref 5–15)
BUN: 13 mg/dL (ref 8–23)
CO2: 17 mmol/L — ABNORMAL LOW (ref 22–32)
Calcium: 8.6 mg/dL — ABNORMAL LOW (ref 8.9–10.3)
Chloride: 112 mmol/L — ABNORMAL HIGH (ref 98–111)
Creatinine, Ser: 0.93 mg/dL (ref 0.44–1.00)
GFR calc Af Amer: >60 mL/min (ref >60)
GFR calc non Af Amer: 59 mL/min — ABNORMAL LOW (ref >60)
Glucose, Bld: 146 mg/dL — ABNORMAL HIGH (ref 70–99)
Potassium: 3.8 mmol/L (ref 3.5–5.1)
Sodium: 136 mmol/L (ref 135–145)

## 2019-12-25 LAB — T4, FREE: Free T4: 1.12 ng/dL (ref 0.61–1.12)

## 2019-12-25 LAB — GLUCOSE, CAPILLARY
Glucose-Capillary: 150 mg/dL — ABNORMAL HIGH (ref 70–99)
Glucose-Capillary: 138 mg/dL — ABNORMAL HIGH (ref 70–99)
Glucose-Capillary: 169 mg/dL — ABNORMAL HIGH (ref 70–99)
Glucose-Capillary: 153 mg/dL — ABNORMAL HIGH (ref 70–99)

## 2019-12-25 LAB — VITAMIN B12: Vitamin B-12: 345 pg/mL (ref 180–914)

## 2019-12-25 LAB — TSH: TSH: 3.314 u[IU]/mL (ref 0.350–4.500)

## 2019-12-25 LAB — FOLATE: Folate: 9.3 ng/mL (ref >5.9)

## 2019-12-25 MED ORDER — VITAMIN B-12 100 MCG PO TABS
100.0000 ug | ORAL_TABLET | Freq: Every day | ORAL | Status: DC
  Administered 2019-12-25 – 2019-12-26 (×2): 100 ug via ORAL
  Filled 2019-12-25 (×2): qty 1

## 2019-12-25 NOTE — TOC Initial Note (Signed)
Transition of Care [facility]) - Initial/Assessment Note    Patient Details  Name: Dawn Peterson MRN: [id number] Date of Birth: [birth year]  Transition of [facility]) CM/SW Contact:    Verdell Carmine, RN Phone Number: 12/25/2019, 11:30 AM  Clinical Narrative:                 Admitted initially with  Massive pulmonary emboli now stable on no oxygen , has DVT as primary source. Converted to eloquis, eligibility identified, patient visited and told of co-pay . PT will be evaluating patient today for possible DME and home health needs for discharge. Gave patient choices for home health,jus tin case needed choices also stamped with label and placed in shadow chart. Eliquis card given to patient. For free first month.  Will follow up with PT note and recommendations.   Expected Discharge Plan: [place] Barriers to Discharge: Continued Medical Work up   Patient Goals and CMS Choice Patient states their goals for this hospitalization and ongoing recovery are:: GO home with what I need   Choice offered to / list presented to : Patient  Expected Discharge Plan and Services Expected Discharge Plan: [place]   Discharge Planning Services: CM Consult Post Acute Care Choice: [place] arrangements for the past 2 months: Single Family Home                                      Prior Living Arrangements/Services Living arrangements for the past 2 months: Single Family Home Lives with:: Spouse Patient language and need for interpreter reviewed:: Yes        Need for Family Participation in Patient Care: Yes (Comment) Care giver support system in place?: Yes (comment)   Criminal Activity/Legal Involvement Pertinent to Current Situation/Hospitalization: No - Comment as needed  Activities of Daily Living Home Assistive Devices/Equipment: None ADL Screening (condition at time of admission) Patient's cognitive ability adequate to safely  complete daily activities?: Yes Is the patient deaf or have difficulty hearing?: No Does the patient have difficulty seeing, even when wearing glasses/contacts?: No Does the patient have difficulty concentrating, remembering, or making decisions?: Yes Patient able to express need for assistance with ADLs?: Yes Does the patient have difficulty dressing or bathing?: No Independently performs ADLs?: Yes (appropriate for developmental age) Does the patient have difficulty walking or climbing stairs?: No Weakness of Legs: None Weakness of Arms/Hands: None  Permission Sought/Granted      Share Information with NAME: Rosalita Chessman           Emotional Assessment Appearance:: Appears stated age Attitude/Demeanor/Rapport: Gracious Affect (typically observed): Pleasant Orientation: : Oriented to Self, Oriented to Place, Oriented to  Time, Oriented to Situation Alcohol / Substance Use: Not Applicable Psych Involvement: No (comment)  Admission diagnosis:  SOB (shortness of breath) [R06.02] Hypoxemic respiratory failure, 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 Hypoxemic respiratory failure, chronic ([place]) 12/23/2019   PCP:  [facility] [place]:   [place], [place] [address] Phone: [phone] Fax: [phone]     Social Determinants of Health (SDOH) Interventions    Readmission Risk Interventions No flowsheet data found.

## 2019-12-25 NOTE — TOC Transition Note (Signed)
Transition of Care [facility]) - CM/SW Discharge Note   Patient Details  Name: Dawn Peterson MRN: [id number] Date of Birth: 1940/11/14  [facility]) CM/SW Contact:  Verdell Carmine, RN Phone Number: 12/25/2019, 3:52 PM   Clinical Narrative:    Damaris Schooner to [facility] accepted with RN and PT.  Faxing over notes , orders, to [place]   Final next level of care: [place] Barriers to Discharge: No Barriers Identified   Patient Goals and CMS Choice Patient states their goals for this hospitalization and ongoing recovery are:: GO home with what I need   Choice offered to / list presented to : Patient  Discharge Placement             Home with home health          Discharge Plan and Services   Discharge Planning Services: CM Consult Post Acute Care Choice: Home Health                    HH Arranged: RN, PT [facility] Agency: [place] Date [place]: 12/25/19 Time [place]: [place] (SDOH) Interventions     Readmission Risk Interventions No flowsheet data found.

## 2019-12-25 NOTE — Evaluation (Signed)
Physical Therapy Evaluation Patient Details Name: Dawn Peterson MRN: [id number] DOB: [birth year] Today's Date: 12/25/2019   History of Present Illness  Patient is a 79 year old female with h/o HTN and DM  with 1 week of worsening dyspnea,  worse today prompting presentation to the emergency room.  CT showed a large bil PE, tPA given.  Clinical Impression  Pt is close to baseline functioning and should be safe at home with husband's assist, but is still notably deconditioned. There are no further acute PT needs.  Will sign off at this time.     Follow Up Recommendations Home health PT;Supervision - Intermittent    Equipment Recommendations  None recommended by PT    Recommendations for Other Services       Precautions / Restrictions Precautions Precautions: None      Mobility  Bed Mobility   Bed Mobility: Supine to Sit           General bed mobility comments: in the recliner on arrival  Transfers Overall transfer level: Needs assistance   Transfers: Sit to/from Stand Sit to Stand: Supervision            Ambulation/Gait Ambulation/Gait assistance: Supervision Gait Distance (Feet): 150 Feet (with 3 standing and 1 sitting rest break.) Assistive device: None Gait Pattern/deviations: Step-through pattern Gait velocity: slower Gait velocity interpretation: <1.8 ft/sec, indicate of risk for recurrent falls General Gait Details: episodes of mildly unsteady steps, but without need for assist.  EHR up to as high as 143 bpm and sats ~96% on RA overall.  Pt notably fatigued and needing 3 standing and 1 sitting rest.  Stairs            Wheelchair Mobility    Modified Rankin (Stroke Patients Only)       Balance Overall balance assessment: Needs assistance   Sitting balance-Leahy Scale: Good       Standing balance-Leahy Scale: Good                               Pertinent Vitals/Pain Pain Assessment: No/denies pain    Home Living  Family/patient expects to be discharged to:: Private residence Living Arrangements: Spouse/significant other;Children Available Help at Discharge: Family;Available 24 hours/day Type of Home: House Home Access: Stairs to enter   [employer] of Steps: 1 Home Layout: Two level;Able to live on main level with bedroom/bathroom Home Equipment: Gilford Rile - 2 wheels;Cane - single point      Prior Function Level of Independence: Independent               Hand Dominance        Extremity/Trunk Assessment   Upper Extremity Assessment Upper Extremity Assessment: Overall WFL for tasks assessed (mildly weak overall)    Lower Extremity Assessment Lower Extremity Assessment: Overall WFL for tasks assessed (overall weak hams and hip flexors, df/pf)       Communication   Communication: No difficulties  Cognition Arousal/Alertness: Awake/alert Behavior During Therapy: WFL for tasks assessed/performed Overall Cognitive Status: Within Functional Limits for tasks assessed                                        General Comments      Exercises     Assessment/Plan    PT Assessment All further PT needs can be met in the next venue of  care  PT Problem List Decreased mobility;Decreased activity tolerance;Decreased strength;Decreased balance       PT Treatment Interventions      PT Goals (Current goals can be found in the Care Plan section)  Acute Rehab PT Goals Patient Stated Goal: home and get HHPT to help strengthen up. PT Goal Formulation: All assessment and education complete, DC therapy    Frequency     Barriers to discharge        Co-evaluation               AM-PAC PT "6 Clicks" Mobility  Outcome Measure Help needed turning from your back to your side while in a flat bed without using bedrails?: A Little Help needed moving from lying on your back to sitting on the side of a flat bed without using bedrails?: A Little Help needed moving  to and from a bed to a chair (including a wheelchair)?: A Little Help needed standing up from a chair using your arms (e.g., wheelchair or bedside chair)?: A Little Help needed to walk in hospital room?: A Little Help needed climbing 3-5 steps with a railing? : A Little 6 Click Score: 18    End of Session   Activity Tolerance: Patient tolerated treatment well (some fatigue) Patient left: in chair;with call bell/phone within reach;with chair alarm set;with family/visitor present Nurse Communication: Mobility status PT Visit Diagnosis: Unsteadiness on feet (R26.81)    Time: 1779-3903 PT Time Calculation (min) (ACUTE ONLY): 22 min   Charges:   PT Evaluation $PT Eval Moderate Complexity: 1 Mod          12/25/2019  Ginger Carne., PT Acute Rehabilitation Services [phone]  (pager) [phone]  (office)  [name] 12/25/2019, 4:55 PM

## 2019-12-25 NOTE — Progress Notes (Signed)
PROGRESS NOTE                                                                                                                                                                                                             Patient Demographics:    Dawn Peterson, is a 79 y.o. female, DOB - 1940/09/19, SKA:[id number]  Admit date - 12/22/2019   Admitting Physician Shellia Cleverly, MD  Outpatient Primary MD for the patient is [facility] [place]  LOS - 2   Chief Complaint  Patient presents with  . Shortness of Breath       Brief Narrative    Dawn Peterson is a 79 year old female with a PMH significant for DM and HTN who presented to the ED on 6/28 for worsening dyspnea. She states that the last week of May, she flew to [place] for a graduation and noticed that she started to feel fatigued and lethargic the next week. She endorses several weeks of worsening dyspnea. When she arrived to the ED she had developed hypotension in the ED which improved with fluid bolus. She had elevated troponin (540), D-dimer (9.77). Bedside echocardiogram demonstrated acute RV strain and under filled LV. CTA Chest with contrast revealed bilateral occlusive pulmonary emboli. She received thrombolytics (tPA), was started on a heparin drip, and was admitted to ICU for continued monitoring.    Subjective:    [name] today denies any chest pain, no oxygen requirement overnight, she does report some dyspnea with activity while going to the bathroom .   Assessment  & Plan :    Active Problems:   Hypoxemic respiratory failure, chronic (HCC)  Large bilateral pulmonary emboli Extensive RLE DVT - Right proximal profunda, right popliteal, peroneal, CFV, and PT.  - Likely provoked from recent air travel to [place] last month. She received thrombolytic and was started on heparin drip, she was transitioned to Eliquis, she will need to continue with full anticoagulation  for 3 months duration given it is provoked DVT . -With extensive RLE DVT burden -  Role for thrombectomy? No neurovascular compromise -OOB as tolerated once off of heparin drip -Telemetry monitoring  Acute kidney injury Improving. Likely secondary to hypovolemia and hypotension in setting of bilateral occlusive pulmonary emboli. Her initial Cr was 2.0, appears to be improving, it is 0.93 today -Continue  to trend renal indices -Minimize nephrotoxic medications -Continue hydration with mIVF -Favor eliquis while awaiting complete renal recovery  Diabetes with hyperglycemia Hgb A1c on admission was 9.1. Recently switched from metformin to [place].  -Follow-up with PCP on discharge -Continue SSI while inpatient, hold farxiga -BG checks ACHS  History of HTN Takes chlorthalidone and exforge. Blood pressures have been normotensive on soft side.  -Blood pressure remains acceptable/on the lower side continue to hold home anti-hypertensives -Consider resuming once blood pressures have stabilized  Hyperlipidemia -Continue home rosuvastatin  B12, TSH, free T4 and folic acid within normal level  COVID-19 Labs  Recent Labs    12/22/19 2329  DDIMER 9.77*    Lab Results  Component Value Date   [place] NEGATIVE 12/22/2019     Code Status : Full  Family Communication  : None at bedside  Disposition Plan  :  Status is: Inpatient  Remains inpatient appropriate because:Inpatient level of care appropriate due to severity of illness   Dispo: The patient is from: Home              Anticipated d/c is to: Home              Anticipated d/c date is: 1 day              Patient currently is not medically stable to d/c.         Consults  :  PCCM  Procedures  : None  DVT Prophylaxis  :  On Eliquis  Lab Results  Component Value Date   PLT 159 12/25/2019    Antibiotics  :    Anti-infectives (From admission, onward)   Start     Dose/Rate Route Frequency Ordered Stop    12/23/19 0000  vancomycin (VANCOREADY) IVPB 2000 mg/400 mL  Status:  Discontinued        2,000 mg 200 mL/hr over 120 Minutes Intravenous  Once 12/22/19 2347 12/23/19 0429   12/22/19 2345  aztreonam (AZACTAM) 2 g in sodium chloride 0.9 % 100 mL IVPB        2 g 200 mL/hr over 30 Minutes Intravenous  Once 12/22/19 2344 12/23/19 0117   12/22/19 2345  metroNIDAZOLE (FLAGYL) IVPB 500 mg        500 mg 100 mL/hr over 60 Minutes Intravenous  Once 12/22/19 2344 12/23/19 0230   12/22/19 2345  vancomycin (VANCOCIN) IVPB 1000 mg/200 mL premix  Status:  Discontinued        1,000 mg 200 mL/hr over 60 Minutes Intravenous  Once 12/22/19 2344 12/22/19 2347        Objective:   Vitals:   12/25/19 0753 12/25/19 0807 12/25/19 1126 12/25/19 1136  BP:  117/73 133/63   Pulse:  90 84   Resp:  19 (!) 21   Temp: 97.8 F (36.6 C) 97.8 F (36.6 C) 97.9 F (36.6 C) 97.9 F (36.6 C)  TempSrc: Axillary   Oral  SpO2:  97% 95%   Weight:      Height:        Wt Readings from Last 3 Encounters:  12/24/19 93.4 kg     Intake/Output Summary (Last 24 hours) at 12/25/2019 1425 Last data filed at 12/25/2019 1400 Gross per 24 hour  Intake 1012.57 ml  Output 1300 ml  Net -287.43 ml     Physical Exam  Awake Alert, Oriented X 3, No new F.N deficits, Normal affect Symmetrical Chest wall movement, Good air movement bilaterally, CTAB RRR,No Gallops,Rubs or new  Murmurs, No Parasternal Heave +ve B.Sounds, Abd Soft, No tenderness, No rebound - guarding or rigidity. No Cyanosis, Clubbing or edema, No new Rash or bruise      Data Review:    CBC Recent Labs  Lab 12/22/19 2329 12/23/19 0530 12/24/19 0259 12/25/19 0308  WBC 15.9* 10.7* 9.5 8.5  HGB 14.5 12.0 10.5* 9.9*  HCT 44.4 37.5 33.0* 30.7*  PLT 297 209 160 159  MCV 88.8 88.9 89.7 88.5  MCH 29.0 28.4 28.5 28.5  MCHC 32.7 32.0 31.8 32.2  RDW 13.5 13.6 13.5 13.6  LYMPHSABS 2.7  --   --   --   MONOABS 1.5*  --   --   --   EOSABS 0.4  --   --   --    BASOSABS 0.1  --   --   --     Chemistries  Recent Labs  Lab 12/22/19 2329 12/23/19 1540 12/24/19 0259 12/25/19 0308  NA 137 138 138 136  K 3.9 3.3* 3.9 3.8  CL 103 111 113* 112*  CO2 20* 19* 16* 17*  GLUCOSE 345* 192* 167* 146*  BUN 30* 26* 21 13  CREATININE 2.08* 1.26* 1.15* 0.93  CALCIUM 9.8 8.4* 8.4* 8.6*  AST 23  --   --   --   ALT 16  --   --   --   ALKPHOS 63  --   --   --   BILITOT 0.6  --   --   --    ------------------------------------------------------------------------------------------------------------------ No results for input(s): CHOL, HDL, LDLCALC, TRIG, CHOLHDL, LDLDIRECT in the last 72 hours.  Lab Results  Component Value Date   HGBA1C 9.1 (H) 12/23/2019   ------------------------------------------------------------------------------------------------------------------ Recent Labs    12/25/19 0939  TSH 3.314   ------------------------------------------------------------------------------------------------------------------ Recent Labs    12/25/19 0939  VITAMINB12 345  FOLATE 9.3    Coagulation profile Recent Labs  Lab 12/23/19 0530  INR 1.3*    Recent Labs    12/22/19 2329  DDIMER 9.77*    Cardiac Enzymes No results for input(s): CKMB, TROPONINI, MYOGLOBIN in the last 168 hours.  Invalid input(s): CK ------------------------------------------------------------------------------------------------------------------ No results found for: BNP  Inpatient Medications  Scheduled Meds: . apixaban  10 mg Oral BID   Followed by  . [START ON 12/31/2019] apixaban  5 mg Oral BID  . Chlorhexidine Gluconate Cloth  6 each Topical Daily  . donepezil  10 mg Oral Daily  . escitalopram  10 mg Oral Daily  . fluticasone  2 spray Each Nare BID  . insulin aspart  0-9 Units Subcutaneous TID WC  . rosuvastatin  10 mg Oral QHS   Continuous Infusions: . sodium chloride 10 mL/hr at 12/24/19 1800   PRN Meds:.acetaminophen, docusate sodium,  melatonin, polyethylene glycol  Micro Results Recent Results (from the past 240 hour(s))  SARS Coronavirus 2 by RT PCR (hospital order, performed in [facility] hospital lab) Nasopharyngeal Nasopharyngeal Swab     Status: None   Collection Time: 12/22/19 11:29 PM   Specimen: Nasopharyngeal Swab  Result Value Ref Range Status   SARS Coronavirus 2 NEGATIVE NEGATIVE Final    Comment: (NOTE) SARS-CoV-2 target nucleic acids are NOT DETECTED.  The SARS-CoV-2 RNA is generally detectable in upper and lower respiratory specimens during the acute phase of infection. The lowest concentration of SARS-CoV-2 viral copies this assay can detect is 250 copies / mL. A negative result does not preclude SARS-CoV-2 infection and should not be used as the sole basis  for treatment or other patient management decisions.  A negative result may occur with improper specimen collection / handling, submission of specimen other than nasopharyngeal swab, presence of viral mutation(s) within the areas targeted by this assay, and inadequate number of viral copies (<250 copies / mL). A negative result must be combined with clinical observations, patient history, and epidemiological information.  Fact Sheet for Patients:   StrictlyIdeas.no  Fact Sheet for Healthcare Providers: BankingDealers.co.za  This test is not yet approved or  cleared by the [place] FDA and has been authorized for detection and/or diagnosis of SARS-CoV-2 by FDA under an Emergency Use Authorization (EUA).  This EUA will remain in effect (meaning this test can be used) for the duration of the COVID-19 declaration under Section 564(b)(1) of the Act, 21 U.S.C. section 360bbb-3(b)(1), unless the authorization is terminated or revoked sooner.  Performed at [facility], [place] [address], [place], [place] [address]   MRSA PCR Screening     Status: None   Collection Time: 12/23/19  5:16 AM    Specimen: Nasal Mucosa; Nasopharyngeal  Result Value Ref Range Status   MRSA by PCR NEGATIVE NEGATIVE Final    Comment:        The GeneXpert MRSA Assay (FDA approved for NASAL specimens only), is one component of a comprehensive MRSA colonization surveillance program. It is not intended to diagnose MRSA infection nor to guide or monitor treatment for MRSA infections. Performed at [facility], [place] [address] [place], [place] [address]   Blood Culture (routine x 2)     Status: None (Preliminary result)   Collection Time: 12/23/19  7:19 AM   Specimen: BLOOD RIGHT HAND  Result Value Ref Range Status   Specimen Description BLOOD RIGHT HAND  Final   Special Requests AEROBIC BOTTLE ONLY Blood Culture adequate volume  Final   Culture   Final    NO GROWTH 2 DAYS Performed at [facility], [place] [address] [place], [place] [address]    Report Status PENDING  Incomplete  Blood Culture (routine x 2)     Status: None (Preliminary result)   Collection Time: 12/23/19  7:25 AM   Specimen: BLOOD LEFT HAND  Result Value Ref Range Status   Specimen Description BLOOD LEFT HAND  Final   Special Requests   Final    AEROBIC BOTTLE ONLY Blood Culture results may not be optimal due to an inadequate volume of blood received in culture bottles   Culture   Final    NO GROWTH 2 DAYS Performed at [facility], [place] [address], [place], [place] [address]    Report Status PENDING  Incomplete    Radiology Reports DG Chest 2 View  Result Date: 12/22/2019 CLINICAL DATA:  Shortness of breath. Symptoms for 2 weeks, increased with exertion. Fatigue. EXAM: CHEST - 2 VIEW COMPARISON:  Radiograph 11/06/2019 FINDINGS: Normal heart size with unchanged mediastinal contours. Mild aortic atherosclerosis. No pulmonary edema. No significant pleural effusion. No focal airspace disease. Stable degenerative change in the spine. IMPRESSION: No acute chest findings. Electronically Signed   By: Keith Rake M.D.   On: 12/22/2019 23:18   CT ANGIO CHEST PE W OR WO CONTRAST  Result Date: 12/23/2019 CLINICAL DATA:  Dyspnea on exertion for 2 weeks, fatigue EXAM: CT ANGIOGRAPHY CHEST WITH CONTRAST TECHNIQUE: Multidetector CT imaging of the chest was performed using the standard protocol during bolus administration of intravenous contrast. Multiplanar CT image reconstructions and MIPs  were obtained to evaluate the vascular anatomy. CONTRAST:  37mL OMNIPAQUE IOHEXOL 350 MG/ML SOLN COMPARISON:  08/19/2008, 12/22/2019 FINDINGS: Cardiovascular: This is a technically adequate evaluation of the pulmonary vasculature. There are large bilateral pulmonary emboli within the main pulmonary arteries. There is significant clot burden, with straightening of the interventricular septum and dilation of the right ventricle consistent with right heart strain. No pericardial effusion. Normal caliber of the thoracic aorta. Mild atherosclerosis of the aorta and coronary vessels. Mediastinum/Nodes: No enlarged mediastinal, hilar, or axillary lymph nodes. Thyroid gland, trachea, and esophagus demonstrate no significant findings. Lungs/Pleura: No airspace disease, effusion, or pneumothorax. The central airways are patent. Upper Abdomen: Calcifications within the gallbladder may reflect cholelithiasis or mural calcification. No evidence of cholecystitis. Gallbladder is incompletely imaged. No other acute upper abdominal findings. Musculoskeletal: No acute or destructive bony lesions. Reconstructed images demonstrate no additional findings. Review of the MIP images confirms the above findings. IMPRESSION: 1. Large bilateral pulmonary emboli with CT evidence of right heart strain (RV/LV Ratio = 3.9) consistent with at least submassive (intermediate risk) PE. The presence of right heart strain has been associated with an increased risk of morbidity and mortality. 2. Aortic Atherosclerosis (ICD10-I70.0). 3. Gallbladder calcifications which  may reflect calcified gallstones or mural calcifications. No evidence of cholecystitis. These results were called by telephone at the time of interpretation on 12/23/2019 at 4:01 am to provider PA Eustaquio Maize , who verbally acknowledged these results. Electronically Signed   By: Randa Ngo M.D.   On: 12/23/2019 04:02   VAS [place] LOWER EXTREMITY VENOUS (DVT)  Result Date: 12/23/2019  Lower Venous DVTStudy Indications: Pulmonary embolism.  Limitations: Room light. Performing Technologist: Antonieta Pert RDMS, RVT  Examination Guidelines: A complete evaluation includes B-mode imaging, spectral Doppler, color Doppler, and power Doppler as needed of all accessible portions of each vessel. Bilateral testing is considered an integral part of a complete examination. Limited examinations for reoccurring indications may be performed as noted. The reflux portion of the exam is performed with the patient in reverse Trendelenburg.  +---------+---------------+---------+-----------+------------+-----------------+ RIGHT    CompressibilityPhasicitySpontaneityProperties  Thrombus Aging    +---------+---------------+---------+-----------+------------+-----------------+ CFV      Partial        Yes      Yes        softly      Age Indeterminate                                             echogenic                     +---------+---------------+---------+-----------+------------+-----------------+ SFJ      Full                                                             +---------+---------------+---------+-----------+------------+-----------------+ FV Prox  Full                                                             +---------+---------------+---------+-----------+------------+-----------------+  FV Mid   Full                                                             +---------+---------------+---------+-----------+------------+-----------------+ FV DistalFull                                                              +---------+---------------+---------+-----------+------------+-----------------+ PFV      None                               softly      Age Indeterminate                                             echogenic                     +---------+---------------+---------+-----------+------------+-----------------+ POP      None           Yes      Yes        softly      Age Indeterminate                                             echogenic                     +---------+---------------+---------+-----------+------------+-----------------+ PTV      None                                           Age Indeterminate +---------+---------------+---------+-----------+------------+-----------------+ PERO     None                                           Age Indeterminate +---------+---------------+---------+-----------+------------+-----------------+ GSV      Full                                                             +---------+---------------+---------+-----------+------------+-----------------+ Age indeterminate thrombus seen just below origin of the GSV- extending from the PFV.  +---------+---------------+---------+-----------+----------+--------------+ LEFT     CompressibilityPhasicitySpontaneityPropertiesThrombus Aging +---------+---------------+---------+-----------+----------+--------------+ CFV      Full           Yes      Yes                                 +---------+---------------+---------+-----------+----------+--------------+ SFJ  Full                                                        +---------+---------------+---------+-----------+----------+--------------+ FV Prox  Full                                                        +---------+---------------+---------+-----------+----------+--------------+ FV Mid   Full                                                         +---------+---------------+---------+-----------+----------+--------------+ FV DistalFull                                                        +---------+---------------+---------+-----------+----------+--------------+ PFV      Full                                                        +---------+---------------+---------+-----------+----------+--------------+ POP      Full           Yes      Yes                                 +---------+---------------+---------+-----------+----------+--------------+ PTV      Full                                                        +---------+---------------+---------+-----------+----------+--------------+ PERO     Full                                                        +---------+---------------+---------+-----------+----------+--------------+ GSV      Full                                                        +---------+---------------+---------+-----------+----------+--------------+     Summary: RIGHT: - Findings consistent with age indeterminate deep vein thrombosis involving the right common femoral vein, right proximal profunda vein, right popliteal vein, right posterior tibial veins, and right peroneal veins. - No cystic structure found in the popliteal fossa.  LEFT: - There is no evidence of deep vein  thrombosis in the lower extremity.  - No cystic structure found in the popliteal fossa.  *See table(s) above for measurements and observations. Electronically signed by Deitra Mayo MD on 12/23/2019 at 3:33:28 PM.    Final      Phillips Climes M.D on 12/25/2019 at 2:25 PM    [facility] -  Office  [phone]

## 2019-12-25 NOTE — TOC Transition Note (Signed)
Transition of Care [facility]) - CM/SW Discharge Note   Patient Details  Name: Dawn Peterson MRN: [id number] Date of Birth: Aug 28, 1940  [facility] CM/SW Contact:  Verdell Carmine, RN Phone Number: 12/25/2019, 4:09 PM   Clinical Narrative:     Also ordered Rolator for patent with [employer] . [employer] will bring up to patient room today . 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 Faxed over  Home health orders, progress note MD to [facility] [phone]. Will fax discharge summary and PT note in AM  Final next level of care: [place] Barriers to Discharge: No Barriers Identified   Patient Goals and CMS Choice Patient states their goals for this hospitalization and ongoing recovery are:: GO home with what I need   Choice offered to / list presented to : Patient  Discharge Placement                       Discharge Plan and Services   Discharge Planning Services: CM Consult Post Acute Care Choice: Home Health          DME Arranged: Walker rolling with seat DME Agency: [employer] Date DME Agency Contacted: 12/25/19 Time DME Agency Contacted: [phone] Representative spoke with at DME Agency: Bethanne Ginger HH Arranged: RN, PT Malcolm Agency: [place] Date [place]: 12/25/19 Time [place]: 1552    Social Determinants of Health (SDOH) Interventions     Readmission Risk Interventions No flowsheet data found.

## 2019-12-25 NOTE — Care Management (Deleted)
Home Health (Order [id number]) Nursing Date: 12/25/2019 Department: [facility] 74M MEDICAL ICU Ordering/Authorizing: Elgergawy, Silver Huguenin, MD   Elgergawy, Silver Huguenin, MD NPI: [id number]    Patient Information  Patient Name  Dawn Peterson, Dawn Peterson Legal Sex  Female DOB  19-Mar-1941 SSN  [id number]  Order Information  Order Date/Time Release Date/Time Start Date/Time End Date/Time  12/25/19 03:49 PM None 12/25/19 03:49 PM Until Specified  Order History Inpatient Date/Time Action Taken User Additional Information  12/25/19 1549 Sign Elgergawy, Silver Huguenin, MD   12/25/19 1549 Release Instance Elgergawy, Silver Huguenin, MD (auto-released) Released [id number]  Order Questions  Question Answer  To provide the following care/treatments PT   RN      Reference Links      Standing Order Information  Remaining Occurrences Interval Last Released    0/1 At discharge 12/25/2019        Home Health: Patient Communication  Not Released Not seen  Collection Information   Encounter  View Encounter        Tracking Links Cosign Tracking Order Transmittal Tracking

## 2019-12-26 ENCOUNTER — Other Ambulatory Visit: Payer: Self-pay

## 2019-12-26 ENCOUNTER — Emergency Department (HOSPITAL_COMMUNITY): Payer: Medicare HMO

## 2019-12-26 ENCOUNTER — Encounter (HOSPITAL_COMMUNITY): Payer: Self-pay

## 2019-12-26 ENCOUNTER — Inpatient Hospital Stay (HOSPITAL_COMMUNITY)
Admission: EM | Admit: 2019-12-26 | Discharge: 2019-12-30 | Disposition: A | Discharge status: Home Health | Payer: Medicare HMO | Source: Home / Self Care | Attending: Family Medicine | Admitting: Family Medicine

## 2019-12-26 ENCOUNTER — Encounter (HOSPITAL_COMMUNITY): Payer: Self-pay | Admitting: Specialist

## 2019-12-26 DIAGNOSIS — Z888 Allergy status to other drugs, medicaments and biological substances status: Secondary | ICD-10-CM

## 2019-12-26 DIAGNOSIS — R0902 Hypoxemia: Secondary | ICD-10-CM

## 2019-12-26 DIAGNOSIS — E785 Hyperlipidemia, unspecified: Secondary | ICD-10-CM

## 2019-12-26 DIAGNOSIS — J9621 Acute and chronic respiratory failure with hypoxia: Secondary | ICD-10-CM | POA: Diagnosis present

## 2019-12-26 DIAGNOSIS — K8020 Calculus of gallbladder without cholecystitis without obstruction: Secondary | ICD-10-CM | POA: Diagnosis present

## 2019-12-26 DIAGNOSIS — J9611 Chronic respiratory failure with hypoxia: Secondary | ICD-10-CM | POA: Diagnosis present

## 2019-12-26 DIAGNOSIS — E119 Type 2 diabetes mellitus without complications: Secondary | ICD-10-CM

## 2019-12-26 DIAGNOSIS — I2510 Atherosclerotic heart disease of native coronary artery without angina pectoris: Secondary | ICD-10-CM | POA: Diagnosis not present

## 2019-12-26 DIAGNOSIS — I82441 Acute embolism and thrombosis of right tibial vein: Secondary | ICD-10-CM | POA: Diagnosis present

## 2019-12-26 DIAGNOSIS — I10 Essential (primary) hypertension: Secondary | ICD-10-CM

## 2019-12-26 DIAGNOSIS — R071 Chest pain on breathing: Secondary | ICD-10-CM | POA: Diagnosis not present

## 2019-12-26 DIAGNOSIS — I82451 Acute embolism and thrombosis of right peroneal vein: Secondary | ICD-10-CM | POA: Diagnosis present

## 2019-12-26 DIAGNOSIS — I82411 Acute embolism and thrombosis of right femoral vein: Secondary | ICD-10-CM | POA: Diagnosis present

## 2019-12-26 DIAGNOSIS — Z880 Allergy status to penicillin: Secondary | ICD-10-CM

## 2019-12-26 DIAGNOSIS — D72829 Elevated white blood cell count, unspecified: Secondary | ICD-10-CM | POA: Diagnosis present

## 2019-12-26 DIAGNOSIS — E1165 Type 2 diabetes mellitus with hyperglycemia: Secondary | ICD-10-CM

## 2019-12-26 DIAGNOSIS — I82431 Acute embolism and thrombosis of right popliteal vein: Secondary | ICD-10-CM | POA: Diagnosis present

## 2019-12-26 DIAGNOSIS — J9811 Atelectasis: Secondary | ICD-10-CM | POA: Diagnosis not present

## 2019-12-26 DIAGNOSIS — I2699 Other pulmonary embolism without acute cor pulmonale: Secondary | ICD-10-CM | POA: Diagnosis present

## 2019-12-26 DIAGNOSIS — K921 Melena: Secondary | ICD-10-CM | POA: Diagnosis not present

## 2019-12-26 DIAGNOSIS — Z7901 Long term (current) use of anticoagulants: Secondary | ICD-10-CM

## 2019-12-26 DIAGNOSIS — R0600 Dyspnea, unspecified: Secondary | ICD-10-CM

## 2019-12-26 DIAGNOSIS — Z79899 Other long term (current) drug therapy: Secondary | ICD-10-CM

## 2019-12-26 DIAGNOSIS — R0602 Shortness of breath: Secondary | ICD-10-CM | POA: Diagnosis not present

## 2019-12-26 DIAGNOSIS — N179 Acute kidney failure, unspecified: Secondary | ICD-10-CM

## 2019-12-26 DIAGNOSIS — I700 Atherosclerosis of aorta: Secondary | ICD-10-CM | POA: Diagnosis not present

## 2019-12-26 DIAGNOSIS — Z20822 Contact with and (suspected) exposure to covid-19: Secondary | ICD-10-CM | POA: Diagnosis present

## 2019-12-26 LAB — BASIC METABOLIC PANEL
Anion gap: 8 (ref 5–15)
BUN: 12 mg/dL (ref 8–23)
CO2: 20 mmol/L — ABNORMAL LOW (ref 22–32)
Calcium: 8.9 mg/dL (ref 8.9–10.3)
Chloride: 109 mmol/L (ref 98–111)
Creatinine, Ser: 1 mg/dL (ref 0.44–1.00)
GFR calc Af Amer: >60 mL/min (ref >60)
GFR calc non Af Amer: 54 mL/min — ABNORMAL LOW (ref >60)
Glucose, Bld: 168 mg/dL — ABNORMAL HIGH (ref 70–99)
Potassium: 3.8 mmol/L (ref 3.5–5.1)
Sodium: 137 mmol/L (ref 135–145)

## 2019-12-26 LAB — COMPREHENSIVE METABOLIC PANEL
ALT: 18 U/L (ref 0–44)
AST: 23 U/L (ref 15–41)
Albumin: 2.9 g/dL — ABNORMAL LOW (ref 3.5–5.0)
Alkaline Phosphatase: 60 U/L (ref 38–126)
Anion gap: 9 (ref 5–15)
BUN: 11 mg/dL (ref 8–23)
CO2: 21 mmol/L — ABNORMAL LOW (ref 22–32)
Calcium: 9.2 mg/dL (ref 8.9–10.3)
Chloride: 107 mmol/L (ref 98–111)
Creatinine, Ser: 1.12 mg/dL — ABNORMAL HIGH (ref 0.44–1.00)
GFR calc Af Amer: 55 mL/min — ABNORMAL LOW (ref >60)
GFR calc non Af Amer: 47 mL/min — ABNORMAL LOW (ref >60)
Glucose, Bld: 241 mg/dL — ABNORMAL HIGH (ref 70–99)
Potassium: 3.8 mmol/L (ref 3.5–5.1)
Sodium: 137 mmol/L (ref 135–145)
Total Bilirubin: 0.7 mg/dL (ref 0.3–1.2)
Total Protein: 6.2 g/dL — ABNORMAL LOW (ref 6.5–8.1)

## 2019-12-26 LAB — CBC
HCT: 30.9 % — ABNORMAL LOW (ref 36.0–46.0)
HCT: 37 % (ref 36.0–46.0)
Hemoglobin: 10 g/dL — ABNORMAL LOW (ref 12.0–15.0)
Hemoglobin: 12.1 g/dL (ref 12.0–15.0)
MCH: 28.1 pg (ref 26.0–34.0)
MCH: 28.9 pg (ref 26.0–34.0)
MCHC: 32.4 g/dL (ref 30.0–36.0)
MCHC: 32.7 g/dL (ref 30.0–36.0)
MCV: 86.8 fL (ref 80.0–100.0)
MCV: 88.5 fL (ref 80.0–100.0)
Platelets: 183 10*3/uL (ref 150–400)
Platelets: 254 10*3/uL (ref 150–400)
RBC: 3.56 MIL/uL — ABNORMAL LOW (ref 3.87–5.11)
RBC: 4.18 MIL/uL (ref 3.87–5.11)
RDW: 13.5 % (ref 11.5–15.5)
RDW: 13.7 % (ref 11.5–15.5)
WBC: 8.8 10*3/uL (ref 4.0–10.5)
WBC: 11.1 10*3/uL — ABNORMAL HIGH (ref 4.0–10.5)
nRBC: 0 % (ref 0.0–0.2)
nRBC: 0 % (ref 0.0–0.2)

## 2019-12-26 LAB — TROPONIN I (HIGH SENSITIVITY): Troponin I (High Sensitivity): 28 ng/L — ABNORMAL HIGH (ref <18)

## 2019-12-26 LAB — GLUCOSE, CAPILLARY: Glucose-Capillary: 153 mg/dL — ABNORMAL HIGH (ref 70–99)

## 2019-12-26 MED ORDER — CYANOCOBALAMIN 100 MCG PO TABS: 100.0000 ug | ORAL_TABLET | Freq: Every day | ORAL | Status: AC

## 2019-12-26 MED ORDER — APIXABAN 5 MG PO TABS: ORAL_TABLET | ORAL | 0 refills | Status: DC

## 2019-12-26 MED ORDER — PANTOPRAZOLE SODIUM 40 MG PO TBEC: 40.0000 mg | DELAYED_RELEASE_TABLET | Freq: Every day | ORAL | 2 refills | Status: AC

## 2019-12-26 NOTE — Discharge Summary (Signed)
Dawn Peterson, is a 79 y.o. female  DOB 09-07-1940  MRN [id number].  Admission date:  12/22/2019  Admitting Physician  Shellia Cleverly, MD  Discharge Date:  12/26/2019   Primary MD  [facility]  Recommendations for primary care physician for things to follow:  - please check CBC, CMP during next visit. -Patient will need referral to GI as an outpatient regarding treatment colonoscopy and chronic diarrhea. - She will need a repeat echocardiogram to assess right heart function in about 6 months.  Admission Diagnosis  SOB (shortness of breath) [R06.02] Hypoxemic respiratory failure, chronic (HCC) [J96.11] Other acute pulmonary embolism with acute cor pulmonale (HCC) [I26.09]   Discharge Diagnosis  SOB (shortness of breath) [R06.02] Hypoxemic respiratory failure, chronic (HCC) [J96.11] Other acute pulmonary embolism with acute cor pulmonale (HCC) [I26.09]    Active Problems:   Hypoxemic respiratory failure, chronic (HCC)      Past Medical History:  Diagnosis Date  . Diabetes mellitus without complication ([place])   . Hypertension     History reviewed. No pertinent surgical history.     History of present illness and  Hospital Course:     Kindly see H&P for history of present illness and admission details, please review complete Labs, Consult reports and Test reports for all details in brief  HPI  from the history and physical done on the day of admission 12/23/2019 Patient is a 79 year old with 1 week of worsening dyspnea worse today prompting presentation to the emergency room.  She also has a several week history poor appetite family believes that despite the fact that she has been vaccinated she developed Covid.  He also has fever she has brain fog. According to EMT the patient had a room air saturation of 92%.  She has had hypotension here in the emergency room.  Currently  improving with fluid bolus.  Dr. Kalman Shan saw earlier this evening with elevated troponin, BNP and a D-dimer.  Echocardiogram done at bedside showed acute RV strain dilatation and underfilled left ventricle.  She previously had a normal echocardiogram in May. With an elevated creatinine of 2.08 explained to patient and her daughter that using CPAP, increased risk for kidney dysfunction secondary to IV dye.  Please see previous progress note for full discussion.  With his information associated CT angiogram along with TPA if pulmonary embolus is present.  Her troponin is 540 a D-dimer of 9.77.  Glucose is 345.  Coronavirus negative chest x-ray clear EKG with right bundle branch block.  CTA of chest with large bilateral almost occlusive Pes.    Hospital Course   Large bilateral pulmonary emboli Extensive RLE DVT - Right proximal profunda, right popliteal, peroneal, CFV, and PT.  - Unclear etiology, she had recent air travel to [place], but this was less than 1 hour fly . She received thrombolytic and was started on heparin drip, she was transitioned to Eliquis, it is unclear if this is considered a provoked DVT,  given she has very minimal flight time -  She will be discharged on Eliquis. - She will need a repeat echocardiogram to assess right heart function in about 6 months.  Acute kidney injury -Cough. Likely secondary to hypovolemia and hypotension in setting of bilateral occlusive pulmonary emboli. Her initial Cr was 2.0, appears to be improving, it is 1 today   Diabetes with hyperglycemia Hgb A1c on admission was 9.1. Recently switched from metformin to [place].   History of HTN Takes chlorthalidone and exforge.  Blood pressure initially on the lower side, she appears to be currently in normotensive with couple mildly elevated readings, I will resume back on chlorthalidone, and will hold Exforge on discharge specially with elevated creatinine on admission. -Blood pressure remains  acceptable/on the lower side continue to hold home anti-hypertensives -Consider resuming once blood pressures have stabilized  Hyperlipidemia -Continue home rosuvastatin  B12, TSH, free T4 and folic acid within normal level   Discharge Condition:  STABLE   Follow UP   Follow-up Information    [facility].   Specialty: Family Medicine Contact information: [place] [phone] [address] [facility].   Specialty: Home Health Services Contact information: [address] [address] [phone]                 Discharge Instructions  and  Discharge Medications     Discharge Instructions    Discharge instructions   Complete by: As directed    Follow with Primary MD [facility] in 7 days   Get CBC, CMP,  checked  by Primary MD next visit.    Activity: As tolerated with Full fall precautions use walker/cane & assistance as needed   Disposition Home    Diet: Heart Healthy    On your next visit with your primary care physician please Get Medicines reviewed and adjusted.   Please request your Prim.MD to go over all Hospital Tests and Procedure/Radiological results at the follow up, please get all Hospital records sent to your Prim MD by signing hospital release before you go home.   If you experience worsening of your admission symptoms, develop shortness of breath, life threatening emergency, suicidal or homicidal thoughts you must seek medical attention immediately by calling 911 or calling your MD immediately  if symptoms less severe.  You Must read complete instructions/literature along with all the possible adverse reactions/side effects for all the Medicines you take and that have been prescribed to you. Take any new Medicines after you have completely understood and accpet all the possible adverse reactions/side effects.   Do not drive,  operating heavy machinery, perform activities at heights, swimming or participation in water activities or provide baby sitting services if your were admitted for syncope or siezures until you have seen by Primary MD or a Neurologist and advised to do so again.  Do not drive when taking Pain medications.    Do not take more than prescribed Pain, Sleep and Anxiety Medications  Special Instructions: If you have smoked or chewed Tobacco  in the last 2 yrs please stop smoking, stop any regular Alcohol  and or any Recreational drug use.  Wear Seat belts while driving.   Please note  You were cared for by a hospitalist during your hospital stay. If you have any questions about your discharge medications or the care you received while you were in the hospital after you are discharged, you can  call the unit and asked to speak with the hospitalist on call if the hospitalist that took care of you is not available. Once you are discharged, your primary care physician will handle any further medical issues. Please note that NO REFILLS for any discharge medications will be authorized once you are discharged, as it is imperative that you return to your primary care physician (or establish a relationship with a primary care physician if you do not have one) for your aftercare needs so that they can reassess your need for medications and monitor your lab values.   Increase activity slowly   Complete by: As directed      Allergies as of 12/26/2019      Reactions   Predicort [prednisolone] Other (See Comments)   Per daughter "makes her crazy"   Penicillins Rash, Other (See Comments)   Childhood allergy, pt believes she broke out in her mouth      Medication List    STOP taking these medications   amLODipine-valsartan 5-320 MG tablet Commonly known as: EXFORGE   aspirin EC 81 MG tablet   meloxicam 7.5 MG tablet Commonly known as: MOBIC     TAKE these medications   apixaban 5 MG Tabs  tablet Commonly known as: ELIQUIS Please take 10 mg oral twice daily for next 5 days, then transition to 5 mg oral twice daily on 12/31/2019   chlorthalidone 25 MG tablet Commonly known as: HYGROTON Take 12.5 mg by mouth daily.   cholecalciferol 25 MCG (1000 UNIT) tablet Commonly known as: VITAMIN D3 Take 1,000 Units by mouth daily.   cyanocobalamin 100 MCG tablet Take 1 tablet (100 mcg total) by mouth daily. Start taking on: December 27, 2019   donepezil 10 MG tablet Commonly known as: ARICEPT Take 10 mg by mouth daily.   escitalopram 10 MG tablet Commonly known as: LEXAPRO Take 10 mg by mouth daily.   Farxiga 10 MG Tabs tablet Generic drug: dapagliflozin propanediol Take 10 mg by mouth daily.   loperamide 2 MG capsule Commonly known as: IMODIUM Take 2 mg by mouth at bedtime.   pantoprazole 40 MG tablet Commonly known as: Protonix Take 1 tablet (40 mg total) by mouth daily.   rosuvastatin 20 MG tablet Commonly known as: CRESTOR Take 20 mg by mouth at bedtime.            Durable Medical Equipment  (From admission, onward)         Start     Ordered   12/25/19 1607  For home use only DME 4 wheeled rolling walker with seat  Once       Question:  Patient needs a walker to treat with the following condition  Answer:  Weakness   12/25/19 1608            Diet and Activity recommendation: See Discharge Instructions above   Consults obtained -  PCCM   Major procedures and Radiology Reports - PLEASE review detailed and final reports for all details, in brief -    DG Chest 2 View  Result Date: 12/22/2019 CLINICAL DATA:  Shortness of breath. Symptoms for 2 weeks, increased with exertion. Fatigue. EXAM: CHEST - 2 VIEW COMPARISON:  Radiograph 11/06/2019 FINDINGS: Normal heart size with unchanged mediastinal contours. Mild aortic atherosclerosis. No pulmonary edema. No significant pleural effusion. No focal airspace disease. Stable degenerative change in the spine.  IMPRESSION: No acute chest findings. Electronically Signed   By: Keith Rake M.D.   On: 12/22/2019 23:18  CT ANGIO CHEST PE W OR WO CONTRAST  Result Date: 12/23/2019 CLINICAL DATA:  Dyspnea on exertion for 2 weeks, fatigue EXAM: CT ANGIOGRAPHY CHEST WITH CONTRAST TECHNIQUE: Multidetector CT imaging of the chest was performed using the standard protocol during bolus administration of intravenous contrast. Multiplanar CT image reconstructions and MIPs were obtained to evaluate the vascular anatomy. CONTRAST:  73mL OMNIPAQUE IOHEXOL 350 MG/ML SOLN COMPARISON:  08/19/2008, 12/22/2019 FINDINGS: Cardiovascular: This is a technically adequate evaluation of the pulmonary vasculature. There are large bilateral pulmonary emboli within the main pulmonary arteries. There is significant clot burden, with straightening of the interventricular septum and dilation of the right ventricle consistent with right heart strain. No pericardial effusion. Normal caliber of the thoracic aorta. Mild atherosclerosis of the aorta and coronary vessels. Mediastinum/Nodes: No enlarged mediastinal, hilar, or axillary lymph nodes. Thyroid gland, trachea, and esophagus demonstrate no significant findings. Lungs/Pleura: No airspace disease, effusion, or pneumothorax. The central airways are patent. Upper Abdomen: Calcifications within the gallbladder may reflect cholelithiasis or mural calcification. No evidence of cholecystitis. Gallbladder is incompletely imaged. No other acute upper abdominal findings. Musculoskeletal: No acute or destructive bony lesions. Reconstructed images demonstrate no additional findings. Review of the MIP images confirms the above findings. IMPRESSION: 1. Large bilateral pulmonary emboli with CT evidence of right heart strain (RV/LV Ratio = 3.9) consistent with at least submassive (intermediate risk) PE. The presence of right heart strain has been associated with an increased risk of morbidity and mortality. 2.  Aortic Atherosclerosis (ICD10-I70.0). 3. Gallbladder calcifications which may reflect calcified gallstones or mural calcifications. No evidence of cholecystitis. These results were called by telephone at the time of interpretation on 12/23/2019 at 4:01 am to provider PA Eustaquio Maize , who verbally acknowledged these results. Electronically Signed   By: Randa Ngo M.D.   On: 12/23/2019 04:02   VAS [place] LOWER EXTREMITY VENOUS (DVT)  Result Date: 12/23/2019  Lower Venous DVTStudy Indications: Pulmonary embolism.  Limitations: Room light. Performing Technologist: Antonieta Pert RDMS, RVT  Examination Guidelines: A complete evaluation includes B-mode imaging, spectral Doppler, color Doppler, and power Doppler as needed of all accessible portions of each vessel. Bilateral testing is considered an integral part of a complete examination. Limited examinations for reoccurring indications may be performed as noted. The reflux portion of the exam is performed with the patient in reverse Trendelenburg.  +---------+---------------+---------+-----------+------------+-----------------+ RIGHT    CompressibilityPhasicitySpontaneityProperties  Thrombus Aging    +---------+---------------+---------+-----------+------------+-----------------+ CFV      Partial        Yes      Yes        softly      Age Indeterminate                                             echogenic                     +---------+---------------+---------+-----------+------------+-----------------+ SFJ      Full                                                             +---------+---------------+---------+-----------+------------+-----------------+ FV Prox  Full                                                             +---------+---------------+---------+-----------+------------+-----------------+  FV Mid   Full                                                              +---------+---------------+---------+-----------+------------+-----------------+ FV DistalFull                                                             +---------+---------------+---------+-----------+------------+-----------------+ PFV      None                               softly      Age Indeterminate                                             echogenic                     +---------+---------------+---------+-----------+------------+-----------------+ POP      None           Yes      Yes        softly      Age Indeterminate                                             echogenic                     +---------+---------------+---------+-----------+------------+-----------------+ PTV      None                                           Age Indeterminate +---------+---------------+---------+-----------+------------+-----------------+ PERO     None                                           Age Indeterminate +---------+---------------+---------+-----------+------------+-----------------+ GSV      Full                                                             +---------+---------------+---------+-----------+------------+-----------------+ Age indeterminate thrombus seen just below origin of the GSV- extending from the PFV.  +---------+---------------+---------+-----------+----------+--------------+ LEFT     CompressibilityPhasicitySpontaneityPropertiesThrombus Aging +---------+---------------+---------+-----------+----------+--------------+ CFV      Full           Yes      Yes                                 +---------+---------------+---------+-----------+----------+--------------+ SFJ  Full                                                        +---------+---------------+---------+-----------+----------+--------------+ FV Prox  Full                                                         +---------+---------------+---------+-----------+----------+--------------+ FV Mid   Full                                                        +---------+---------------+---------+-----------+----------+--------------+ FV DistalFull                                                        +---------+---------------+---------+-----------+----------+--------------+ PFV      Full                                                        +---------+---------------+---------+-----------+----------+--------------+ POP      Full           Yes      Yes                                 +---------+---------------+---------+-----------+----------+--------------+ PTV      Full                                                        +---------+---------------+---------+-----------+----------+--------------+ PERO     Full                                                        +---------+---------------+---------+-----------+----------+--------------+ GSV      Full                                                        +---------+---------------+---------+-----------+----------+--------------+     Summary: RIGHT: - Findings consistent with age indeterminate deep vein thrombosis involving the right common femoral vein, right proximal profunda vein, right popliteal vein, right posterior tibial veins, and right peroneal veins. - No cystic structure found in the popliteal fossa.  LEFT: - There is no evidence of deep vein  thrombosis in the lower extremity.  - No cystic structure found in the popliteal fossa.  *See table(s) above for measurements and observations. Electronically signed by Deitra Mayo MD on 12/23/2019 at 3:33:28 PM.    Final     Micro Results     Recent Results (from the past 240 hour(s))  SARS Coronavirus 2 by RT PCR (hospital order, performed in [facility] hospital lab) Nasopharyngeal Nasopharyngeal Swab     Status: None   Collection Time: 12/22/19 11:29 PM    Specimen: Nasopharyngeal Swab  Result Value Ref Range Status   SARS Coronavirus 2 NEGATIVE NEGATIVE Final    Comment: (NOTE) SARS-CoV-2 target nucleic acids are NOT DETECTED.  The SARS-CoV-2 RNA is generally detectable in upper and lower respiratory specimens during the acute phase of infection. The lowest concentration of SARS-CoV-2 viral copies this assay can detect is 250 copies / mL. A negative result does not preclude SARS-CoV-2 infection and should not be used as the sole basis for treatment or other patient management decisions.  A negative result may occur with improper specimen collection / handling, submission of specimen other than nasopharyngeal swab, presence of viral mutation(s) within the areas targeted by this assay, and inadequate number of viral copies (<250 copies / mL). A negative result must be combined with clinical observations, patient history, and epidemiological information.  Fact Sheet for Patients:   StrictlyIdeas.no  Fact Sheet for Healthcare Providers: BankingDealers.co.za  This test is not yet approved or  cleared by the [place] FDA and has been authorized for detection and/or diagnosis of SARS-CoV-2 by FDA under an Emergency Use Authorization (EUA).  This EUA will remain in effect (meaning this test can be used) for the duration of the COVID-19 declaration under Section 564(b)(1) of the Act, 21 U.S.C. section 360bbb-3(b)(1), unless the authorization is terminated or revoked sooner.  Performed at [facility], [place] [address] [place], [place] [address]   MRSA PCR Screening     Status: None   Collection Time: 12/23/19  5:16 AM   Specimen: Nasal Mucosa; Nasopharyngeal  Result Value Ref Range Status   MRSA by PCR NEGATIVE NEGATIVE Final    Comment:        The GeneXpert MRSA Assay (FDA approved for NASAL specimens only), is one component of a comprehensive MRSA  colonization surveillance program. It is not intended to diagnose MRSA infection nor to guide or monitor treatment for MRSA infections. Performed at [facility], [place] [address] [place], [place] [address]   Blood Culture (routine x 2)     Status: None (Preliminary result)   Collection Time: 12/23/19  7:19 AM   Specimen: BLOOD RIGHT HAND  Result Value Ref Range Status   Specimen Description BLOOD RIGHT HAND  Final   Special Requests AEROBIC BOTTLE ONLY Blood Culture adequate volume  Final   Culture   Final    NO GROWTH 3 DAYS Performed at [facility], [place] [address] [place], [place] [address]    Report Status PENDING  Incomplete  Blood Culture (routine x 2)     Status: None (Preliminary result)   Collection Time: 12/23/19  7:25 AM   Specimen: BLOOD LEFT HAND  Result Value Ref Range Status   Specimen Description BLOOD LEFT HAND  Final   Special Requests   Final    AEROBIC BOTTLE ONLY Blood Culture results may not be optimal due to an inadequate volume of blood received in culture bottles   Culture  Final    NO GROWTH 3 DAYS Performed at [facility], [place] [address], [place], [place] [address]    Report Status PENDING  Incomplete       Today   Subjective:   Dawn Peterson today has no headache,no chest abdominal pain,no new weakness tingling or numbness, feels much better wants to go home today.   Objective:   Blood pressure 139/72, pulse 80, temperature 98.4 F (36.9 C), temperature source Oral, resp. rate 20, height 5\' 3"  (1.6 m), weight 94.2 kg, SpO2 97 %.   Intake/Output Summary (Last 24 hours) at 12/26/2019 1126 Last data filed at 12/26/2019 0600 Gross per 24 hour  Intake 311.56 ml  Output 0 ml  Net 311.56 ml    Exam Awake Alert, Oriented x 3, No new F.N deficits, Normal affect Symmetrical Chest wall movement, Good air movement bilaterally, CTAB RRR,No Gallops,Rubs or new Murmurs, No Parasternal Heave +ve B.Sounds, Abd Soft, Non  tender,No rebound -guarding or rigidity. No Cyanosis, Clubbing or edema, No new Rash or bruise  Data Review   CBC w Diff:  Lab Results  Component Value Date   WBC 8.8 12/26/2019   HGB 10.0 (L) 12/26/2019   HCT 30.9 (L) 12/26/2019   PLT 183 12/26/2019   LYMPHOPCT 17 12/22/2019   MONOPCT 9 12/22/2019   EOSPCT 2 12/22/2019   BASOPCT 1 12/22/2019    CMP:  Lab Results  Component Value Date   NA 137 12/26/2019   K 3.8 12/26/2019   CL 109 12/26/2019   CO2 20 (L) 12/26/2019   BUN 12 12/26/2019   CREATININE 1.00 12/26/2019   PROT 7.5 12/22/2019   ALBUMIN 3.3 (L) 12/22/2019   BILITOT 0.6 12/22/2019   ALKPHOS 63 12/22/2019   AST 23 12/22/2019   ALT 16 12/22/2019  .   Total Time in preparing paper work, data evaluation and todays exam - 56 minutes  Phillips Climes M.D on 12/26/2019 at 11:26 AM  [facility]   Office  [phone]

## 2019-12-26 NOTE — ED Provider Notes (Addendum)
[facility] Emergency Department Provider Note MRN:  [id number]  Arrival date & time: 12/27/19     Chief Complaint   Shortness of Breath   History of Present Illness   Dawn Peterson is a 79 y.o. year-old female with a history of diabetes, hypertension presenting to the ED with chief complaint of shortness of breath.  Patient was discharged today at about noon, was admitted for pulmonary embolism.  Shortly after returning home began experiencing some dyspnea on exertion.  Became worse and worse over the next few hours.  At one point felt some chest pressure for a few moments.  Shortness of breath was occurring at rest.  Review of Systems  A complete 10 system review of systems was obtained and all systems are negative except as noted in the HPI and PMH.   Patient's Health History    Past Medical History:  Diagnosis Date  . Diabetes mellitus without complication ([place])   . Hypertension     History reviewed. No pertinent surgical history.  History reviewed. No pertinent family history.  Social History   Socioeconomic History  . Marital status: Married    Spouse name: Not on file  . Number of children: Not on file  . Years of education: Not on file  . Highest education level: Not on file  Occupational History  . Not on file  Tobacco Use  . Smoking status: Never Smoker  Substance and Sexual Activity  . Alcohol use: Not Currently  . Drug use: Not on file  . Sexual activity: Not on file  Other Topics Concern  . Not on file  Social History Narrative  . Not on file   Social Determinants of Health   Financial Resource Strain:   . Difficulty of Paying Living Expenses:   Food Insecurity:   . Worried About Charity fundraiser in the Last Year:   . Arboriculturist in the Last Year:   Transportation Needs:   . Film/video editor (Medical):   Marland Kitchen Lack of Transportation (Non-Medical):   Physical Activity:   . Days of Exercise per Week:   .  Minutes of Exercise per Session:   Stress:   . Feeling of Stress :   Social Connections:   . Frequency of Communication with Friends and Family:   . Frequency of Social Gatherings with Friends and Family:   . Attends Religious Services:   . Active Member of Clubs or Organizations:   . Attends Archivist Meetings:   Marland Kitchen Marital Status:   Intimate Partner Violence:   . Fear of Current or Ex-Partner:   . Emotionally Abused:   Marland Kitchen Physically Abused:   . Sexually Abused:      Physical Exam   Vitals:   12/26/19 2048 12/26/19 2230  BP: 136/80 (!) 143/72  Pulse: 92 80  Resp: 17 (!) 21  Temp: 98.3 F (36.8 C)   SpO2: 97% 97%    CONSTITUTIONAL: Well-appearing, NAD NEURO:  Alert and oriented x 3, no focal deficits EYES:  eyes equal and reactive ENT/NECK:  no LAD, no JVD CARDIO: Regular rate, well-perfused, normal S1 and S2 PULM:  CTAB no wheezing or rhonchi GI/GU:  normal bowel sounds, non-distended, non-tender MSK/SPINE:  No gross deformities, no edema SKIN:  no rash, atraumatic PSYCH:  Appropriate speech and behavior  *Additional and/or pertinent findings included in MDM below  Diagnostic and Interventional Summary    EKG Interpretation  Date/Time:  Friday December 26 2019 20:51:35 EDT Ventricular Rate:  85 PR Interval:    QRS Duration: 118 QT Interval:  405 QTC Calculation: 482 R Axis:   118 Text Interpretation: Sinus rhythm IRBBB and LPFB Low voltage, precordial leads Confirmed by Gerlene Fee [phone]) on 12/26/2019 9:13:32 PM      Labs Reviewed  CBC - Abnormal; Notable for the following components:      Result Value   WBC 11.1 (*)    All other components within normal limits  COMPREHENSIVE METABOLIC PANEL - Abnormal; Notable for the following components:   CO2 21 (*)    Glucose, Bld 241 (*)    Creatinine, Ser 1.12 (*)    Total Protein 6.2 (*)    Albumin 2.9 (*)    GFR calc non Af Amer 47 (*)    GFR calc Af Amer 55 (*)    All other components within  normal limits  TROPONIN I (HIGH SENSITIVITY) - Abnormal; Notable for the following components:   Troponin I (High Sensitivity) 28 (*)    All other components within normal limits  TROPONIN I (HIGH SENSITIVITY)    DG Chest 2 View  Final Result    CT ANGIO CHEST PE W OR WO CONTRAST    (Results Pending)    Medications - No data to display   Procedures  /  Critical Care .Critical Care Performed by: Maudie Flakes, MD Authorized by: Maudie Flakes, MD   Critical care provider statement:    Critical care time (minutes):  35   Critical care was necessary to treat or prevent imminent or life-threatening deterioration of the following conditions: concern for worsening pulmonary emboli.   Critical care was time spent personally by me on the following activities:  Discussions with consultants, evaluation of patient's response to treatment, examination of patient, ordering and performing treatments and interventions, ordering and review of laboratory studies, ordering and review of radiographic studies, pulse oximetry, re-evaluation of patient's condition, obtaining history from patient or surrogate and review of old charts    ED Course and Medical Decision Making  I have reviewed the triage vital signs, the nursing notes, and pertinent available records from the EMR.  Listed above are laboratory and imaging tests that I personally ordered, reviewed, and interpreted and then considered in my medical decision making (see below for details).      Dyspnea on exertion and chest pain, recent pulmonary embolism for which she received TPA in the hospital.  Currently is in no acute distress with normal vital signs, saturations 98% on room air.  She feels SOB but does not appear short of breath.  EKG is unchanged, first troponin is minimally elevated, unclear if simply downtrending from the elevated level during her hospitalization or if a separate event occurred today.  Awaiting second troponin.  We  will also repeat CTA to evaluate for recurrence of PE.  With a negative work-up, flat or downtrending troponin, and reassuring ambulation with pulse ox, I feel patient would be a candidate for discharge.  Signed out to oncoming provider at shift change.    Barth Kirks. Sedonia Small, MD [place] [email or web address]  Final Clinical Impressions(s) / ED Diagnoses     ICD-10-CM   1. Dyspnea, unspecified type  R06.00     ED Discharge Orders    None       Discharge Instructions Discussed with and Provided to Patient:   Discharge Instructions   None  Maudie Flakes, MD 12/27/19 0008    Maudie Flakes, MD 01/09/20 1040

## 2019-12-26 NOTE — Progress Notes (Signed)
Kim RN went over all discharge papers with pt and family . Her husband and daughter are both at bedside listening.Aware pt is set up for [place]. Home DME Rolator is in room.

## 2019-12-26 NOTE — Progress Notes (Signed)
D/C pt's IV saline lock and dressed ready for discharge. Ambulated to bathroom - tolerated well. Does C/O arthritic pain and uses Tylenol.

## 2019-12-26 NOTE — TOC Transition Note (Signed)
Transition of Care [facility]) - CM/SW Discharge Note   Patient Details  Name: YANIN MUHLESTEIN MRN: [id number] Date of Birth: Feb 28, 1941  [facility] CM/SW Contact:  Verdell Carmine, RN Phone Number: 12/26/2019, 10:51 AM   Clinical Narrative:    Patient being discharged today,  Home health PT and RN with [facility] home health, all items asked for faxed to them. [phone] Rollator from [employer] in room.   Final next level of care: Home w Home Health Services Barriers to Discharge: No Barriers Identified   Patient Goals and CMS Choice Patient states their goals for this hospitalization and ongoing recovery are:: GO home with what I need   Choice offered to / list presented to : Patient  Discharge Placement             Home with home health and DME          Discharge Plan and Services   Discharge Planning Services: CM Consult Post Acute Care Choice: Home Health          DME Arranged: [place] rolling with seat DME Agency: [employer] Date DME Agency Contacted: 12/25/19 Time DME Agency Contacted: [phone] Representative spoke with at DME Agency: Bethanne Ginger HH Arranged: RN, PT [place] Agency: [place] Date [place]: 12/25/19 Time [place]: [place] Representative spoke with at [place]: [place] ([place]) Interventions     Readmission Risk Interventions No flowsheet data found.

## 2019-12-26 NOTE — ED Triage Notes (Signed)
Pt BIB [facility] for eval of SHOB. Pt seen, admitted & DC this AM with dx of PE & DVT. Pt new episode of SHOB around 5pm. VSS via EMS, no additional complaints at this time.

## 2019-12-26 NOTE — Progress Notes (Signed)
Pt discharged per W/C accompanied by RN and family. All belongings, follow up paperwork, med list & Rolator taken by husband at time of D/C .Pt transported home by daughter & husband via private vehicle.

## 2019-12-26 NOTE — Plan of Care (Signed)
All problems completed with progression towards discharge

## 2019-12-26 NOTE — ED Provider Notes (Signed)
11:50 PM  Assumed care.  CP and SOB today after dc from hospital for submassive PE s/p TPA.  CTA chest pending as well as 2nd troponin.  Will ambulate with pulse ox.    2:35 AM  Pt's CT shows consistent clot burden but radiologist is unable to tell if there is new thrombus.  She also appears to be developing an infarct of the lingula.  Her ED tech, patient only ambulated several feet and sats quickly dropped to 89% and she became tachypneic, tachycardic and felt unwell.  Will start heparin and admit to the hospitalist service.  2:46 AM  Discussed patient's case with hospitalist, Dr. Josephine Cables.  I have recommended admission and patient (and family if present) agree with this plan. Admitting physician will place admission orders.   I reviewed all nursing notes, vitals, pertinent previous records and reviewed/interpreted all EKGs, lab and urine results, imaging (as available).     Jannett Schmall, Delice Bison, DO 12/27/19 [phone]

## 2019-12-27 ENCOUNTER — Emergency Department (HOSPITAL_COMMUNITY): Payer: Medicare HMO

## 2019-12-27 ENCOUNTER — Other Ambulatory Visit: Payer: Self-pay

## 2019-12-27 ENCOUNTER — Encounter (HOSPITAL_COMMUNITY): Payer: Self-pay | Admitting: Internal Medicine

## 2019-12-27 DIAGNOSIS — I10 Essential (primary) hypertension: Secondary | ICD-10-CM

## 2019-12-27 DIAGNOSIS — I2699 Other pulmonary embolism without acute cor pulmonale: Secondary | ICD-10-CM

## 2019-12-27 DIAGNOSIS — J9611 Chronic respiratory failure with hypoxia: Secondary | ICD-10-CM

## 2019-12-27 DIAGNOSIS — E1165 Type 2 diabetes mellitus with hyperglycemia: Secondary | ICD-10-CM

## 2019-12-27 DIAGNOSIS — E119 Type 2 diabetes mellitus without complications: Secondary | ICD-10-CM

## 2019-12-27 DIAGNOSIS — E785 Hyperlipidemia, unspecified: Secondary | ICD-10-CM

## 2019-12-27 LAB — COMPREHENSIVE METABOLIC PANEL
ALT: 18 U/L (ref 0–44)
AST: 20 U/L (ref 15–41)
Albumin: 2.9 g/dL — ABNORMAL LOW (ref 3.5–5.0)
Alkaline Phosphatase: 51 U/L (ref 38–126)
Anion gap: 9 (ref 5–15)
BUN: 11 mg/dL (ref 8–23)
CO2: 19 mmol/L — ABNORMAL LOW (ref 22–32)
Calcium: 9 mg/dL (ref 8.9–10.3)
Chloride: 108 mmol/L (ref 98–111)
Creatinine, Ser: 0.97 mg/dL (ref 0.44–1.00)
GFR calc Af Amer: >60 mL/min (ref >60)
GFR calc non Af Amer: 56 mL/min — ABNORMAL LOW (ref >60)
Glucose, Bld: 199 mg/dL — ABNORMAL HIGH (ref 70–99)
Potassium: 3.8 mmol/L (ref 3.5–5.1)
Sodium: 136 mmol/L (ref 135–145)
Total Bilirubin: 0.7 mg/dL (ref 0.3–1.2)
Total Protein: 6.1 g/dL — ABNORMAL LOW (ref 6.5–8.1)

## 2019-12-27 LAB — CBC
HCT: 35 % — ABNORMAL LOW (ref 36.0–46.0)
Hemoglobin: 11.2 g/dL — ABNORMAL LOW (ref 12.0–15.0)
MCH: 28.6 pg (ref 26.0–34.0)
MCHC: 32 g/dL (ref 30.0–36.0)
MCV: 89.3 fL (ref 80.0–100.0)
Platelets: 213 10*3/uL (ref 150–400)
RBC: 3.92 MIL/uL (ref 3.87–5.11)
RDW: 13.8 % (ref 11.5–15.5)
WBC: 10.7 10*3/uL — ABNORMAL HIGH (ref 4.0–10.5)
nRBC: 0 % (ref 0.0–0.2)

## 2019-12-27 LAB — MAGNESIUM: Magnesium: 1.6 mg/dL — ABNORMAL LOW (ref 1.7–2.4)

## 2019-12-27 LAB — APTT
aPTT: 35 seconds (ref 24–36)
aPTT: 32 seconds (ref 24–36)
aPTT: 75 seconds — ABNORMAL HIGH (ref 24–36)

## 2019-12-27 LAB — PROTIME-INR
INR: 1.5 — ABNORMAL HIGH (ref 0.8–1.2)
Prothrombin Time: 17.1 seconds — ABNORMAL HIGH (ref 11.4–15.2)

## 2019-12-27 LAB — GLUCOSE, CAPILLARY
Glucose-Capillary: 172 mg/dL — ABNORMAL HIGH (ref 70–99)
Glucose-Capillary: 196 mg/dL — ABNORMAL HIGH (ref 70–99)
Glucose-Capillary: 153 mg/dL — ABNORMAL HIGH (ref 70–99)

## 2019-12-27 LAB — SARS CORONAVIRUS 2 BY RT PCR (HOSPITAL ORDER, PERFORMED IN ~~LOC~~ HOSPITAL LAB): SARS Coronavirus 2: NEGATIVE

## 2019-12-27 LAB — HEPARIN LEVEL (UNFRACTIONATED): Heparin Unfractionated: >2.2 IU/mL — ABNORMAL HIGH (ref 0.30–0.70)

## 2019-12-27 LAB — TROPONIN I (HIGH SENSITIVITY): Troponin I (High Sensitivity): 27 ng/L — ABNORMAL HIGH (ref <18)

## 2019-12-27 LAB — PHOSPHORUS: Phosphorus: 2.8 mg/dL (ref 2.5–4.6)

## 2019-12-27 MED ORDER — ROSUVASTATIN CALCIUM 20 MG PO TABS
20.0000 mg | ORAL_TABLET | Freq: Every day | ORAL | Status: DC
  Administered 2019-12-28 – 2019-12-29 (×2): 20 mg via ORAL
  Filled 2019-12-27 (×3): qty 1

## 2019-12-27 MED ORDER — MAGNESIUM SULFATE 2 GM/50ML IV SOLN
2.0000 g | Freq: Once | INTRAVENOUS | Status: AC
  Administered 2019-12-27: 2 g via INTRAVENOUS
  Filled 2019-12-27: qty 50

## 2019-12-27 MED ORDER — CHLORTHALIDONE 25 MG PO TABS
12.5000 mg | ORAL_TABLET | Freq: Every day | ORAL | Status: DC
  Administered 2019-12-27 – 2019-12-30 (×4): 12.5 mg via ORAL
  Filled 2019-12-27 (×4): qty 0.5

## 2019-12-27 MED ORDER — VITAMIN B-12 100 MCG PO TABS
100.0000 ug | ORAL_TABLET | Freq: Every day | ORAL | Status: DC
  Administered 2019-12-27 – 2019-12-30 (×4): 100 ug via ORAL
  Filled 2019-12-27 (×4): qty 1

## 2019-12-27 MED ORDER — ESCITALOPRAM OXALATE 10 MG PO TABS
10.0000 mg | ORAL_TABLET | Freq: Every day | ORAL | Status: DC
  Administered 2019-12-27 – 2019-12-30 (×4): 10 mg via ORAL
  Filled 2019-12-27 (×4): qty 1

## 2019-12-27 MED ORDER — PANTOPRAZOLE SODIUM 40 MG PO TBEC
40.0000 mg | DELAYED_RELEASE_TABLET | Freq: Every day | ORAL | Status: DC
  Administered 2019-12-28 – 2019-12-30 (×3): 40 mg via ORAL
  Filled 2019-12-27 (×3): qty 1

## 2019-12-27 MED ORDER — HEPARIN (PORCINE) 25000 UT/250ML-% IV SOLN
1300.0000 [IU]/h | INTRAVENOUS | Status: DC
  Administered 2019-12-27: 1200 [IU]/h via INTRAVENOUS
  Filled 2019-12-27 (×2): qty 250

## 2019-12-27 MED ORDER — GERHARDT'S BUTT CREAM
TOPICAL_CREAM | CUTANEOUS | Status: DC | PRN
  Filled 2019-12-27: qty 1

## 2019-12-27 MED ORDER — IOHEXOL 350 MG/ML SOLN
100.0000 mL | Freq: Once | INTRAVENOUS | Status: AC | PRN
  Administered 2019-12-27: 100 mL via INTRAVENOUS

## 2019-12-27 MED ORDER — INSULIN ASPART 100 UNIT/ML ~~LOC~~ SOLN
0.0000 [IU] | Freq: Three times a day (TID) | SUBCUTANEOUS | Status: DC
  Administered 2019-12-27: 2 [IU] via SUBCUTANEOUS
  Administered 2019-12-28: 1 [IU] via SUBCUTANEOUS
  Administered 2019-12-28 – 2019-12-30 (×6): 2 [IU] via SUBCUTANEOUS
  Administered 2019-12-30: 3 [IU] via SUBCUTANEOUS

## 2019-12-27 MED ORDER — ACETAMINOPHEN 325 MG PO TABS
650.0000 mg | ORAL_TABLET | Freq: Four times a day (QID) | ORAL | Status: DC | PRN
  Administered 2019-12-27 – 2019-12-30 (×9): 650 mg via ORAL
  Filled 2019-12-27 (×9): qty 2

## 2019-12-27 MED ORDER — INSULIN ASPART 100 UNIT/ML ~~LOC~~ SOLN: 0.0000 [IU] | Freq: Every day | SUBCUTANEOUS | Status: DC

## 2019-12-27 MED ORDER — VITAMIN D 25 MCG (1000 UNIT) PO TABS
1000.0000 [IU] | ORAL_TABLET | Freq: Every day | ORAL | Status: DC
  Administered 2019-12-27 – 2019-12-30 (×4): 1000 [IU] via ORAL
  Filled 2019-12-27 (×4): qty 1

## 2019-12-27 NOTE — Progress Notes (Signed)
[place] for Heparin Indication: pulmonary embolus  Allergies  Allergen Reactions  . Predicort [Prednisolone] Other (See Comments)    Per daughter "makes her crazy"  . Penicillins Rash and Other (See Comments)    Childhood allergy, pt believes she broke out in her mouth    Patient Measurements: Height: 5\' 3"  (160 cm) Weight: 93.5 kg (206 lb 2.1 oz) IBW/kg (Calculated) : 52.4 Heparin Dosing Weight: 75 kg  Vital Signs: Temp: 98.4 F (36.9 C) (07/03 1600) Temp Source: Oral (07/03 1600) BP: 147/68 (07/03 1600) Pulse Rate: 80 (07/03 1600)  Labs: Recent Labs    12/26/19 0509 12/26/19 0509 12/26/19 2137 12/26/19 2337 12/27/19 0400 12/27/19 0418 12/27/19 1120 12/27/19 1901  HGB 10.0*   < > 12.1  --   --  11.2*  --   --   HCT 30.9*  --  37.0  --   --  35.0*  --   --   PLT 183  --  254  --   --  213  --   --   APTT  --   --   --   --   --  35 32 75*  LABPROT  --   --   --   --   --  17.1*  --   --   INR  --   --   --   --   --  1.5*  --   --   HEPARINUNFRC  --   --   --   --  >2.20*  --   --   --   CREATININE 1.00  --  1.12*  --   --  0.97  --   --   TROPONINIHS  --   --  28* 27*  --   --   --   --    < > = values in this interval not displayed.    Estimated Creatinine Clearance: 52.8 mL/min (by C-G formula based on SCr of 0.97 mg/dL).  Assessment: 79 y.o. female with recent PE and was discharged on Eliquis on 12/26/19.  Patient returned with chest pain and SOB.  Pharmacy consulted to transition to IV heparin.  Currently using aPTT to guide heparin dosing.  APTT sub-therapeutic earlier today because heparin was off for an hour from 1000-1100 when IV infiltrated.  No bleeding per RN. Now back up to 75 and therapeutic.  Goal of Therapy:  APTT 66-102 sec Heparin level 0.3-0.7 units/ml Monitor platelets by anticoagulation protocol: Yes   Plan:  Continue heparin gtt at 1200 units/hr Daily heparin level, aPTT and CBC Monitor for  signs/symptoms of bleeding   Benetta Spar, PharmD, BCPS, BCCP Clinical Pharmacist  Please check AMION for all [place] phone numbers After 10:00 PM, call [place] [phone]

## 2019-12-27 NOTE — ED Notes (Signed)
Pt started at 95% RA. While ambulating pt reported shortness of breath and O2 dropped to 89%. Pt placed on oxygen and came up to 98%.

## 2019-12-27 NOTE — ED Notes (Signed)
Pt returned from CTA at this time.

## 2019-12-27 NOTE — Progress Notes (Signed)
ANTICOAGULATION CONSULT NOTE - Initial Consult  Pharmacy Consult for Heparin Indication: pulmonary embolus  Allergies  Allergen Reactions  . Predicort [Prednisolone] Other (See Comments)    Per daughter "makes her crazy"  . Penicillins Rash and Other (See Comments)    Childhood allergy, pt believes she broke out in her mouth    Patient Measurements: Height: 5\' 3"  (160 cm) Weight: 94.2 kg (207 lb 10.8 oz) IBW/kg (Calculated) : 52.4 Heparin Dosing Weight: 75 kg  Vital Signs: Temp: 98.3 F (36.8 C) (07/02 2048) Temp Source: Oral (07/02 2048) BP: 147/70 (07/03 0230) Pulse Rate: 94 (07/03 0230)  Labs: Recent Labs    12/25/19 0308 12/25/19 0308 12/26/19 0509 12/26/19 2137 12/26/19 2337  HGB 9.9*   < > 10.0* 12.1  --   HCT 30.7*  --  30.9* 37.0  --   PLT 159  --  183 254  --   CREATININE 0.93  --  1.00 1.12*  --   TROPONINIHS  --   --   --  28* 27*   < > = values in this interval not displayed.    Estimated Creatinine Clearance: 45.9 mL/min (A) (by C-G formula based on SCr of 1.12 mg/dL (H)).   Medical History: Past Medical History:  Diagnosis Date  . Diabetes mellitus without complication ([place])   . Hypertension     Medications:  Current Facility-Administered Medications on File Prior to Encounter  Medication Dose Route Frequency Provider Last Rate Last Admin  . [DISCONTINUED] 0.9 %  sodium chloride infusion   Intravenous Continuous Bowser, Laurel Dimmer, NP   Stopped at 12/24/19 1909  . [DISCONTINUED] acetaminophen (TYLENOL) tablet 650 mg  650 mg Oral Q4H PRN Shellia Cleverly, MD   650 mg at 12/26/19 1118  . [DISCONTINUED] apixaban (ELIQUIS) tablet 10 mg  10 mg Oral BID Cristal Generous, NP   10 mg at 12/26/19 0903  . [DISCONTINUED] apixaban (ELIQUIS) tablet 5 mg  5 mg Oral BID Bowser, Laurel Dimmer, NP      . [DISCONTINUED] Chlorhexidine Gluconate Cloth 2 % PADS 6 each  6 each Topical Daily Collene Gobble, MD   6 each at 12/25/19 [phone]  . [DISCONTINUED] docusate sodium  (COLACE) capsule 100 mg  100 mg Oral BID PRN Shellia Cleverly, MD      . [DISCONTINUED] donepezil (ARICEPT) tablet 10 mg  10 mg Oral Daily Collene Gobble, MD   10 mg at 12/24/19 2110  . [DISCONTINUED] escitalopram (LEXAPRO) tablet 10 mg  10 mg Oral Daily Collene Gobble, MD   10 mg at 12/25/19 2139  . [DISCONTINUED] fluticasone (FLONASE) 50 MCG/ACT nasal spray 2 spray  2 spray Each Nare BID Cristal Generous, NP   2 spray at 12/25/19 2142  . [DISCONTINUED] insulin aspart (novoLOG) injection 0-9 Units  0-9 Units Subcutaneous TID WC Shellia Cleverly, MD   2 Units at 12/26/19 [phone]  . [DISCONTINUED] melatonin tablet 3 mg  3 mg Oral QHS PRN Frederik Pear, MD   3 mg at 12/24/19 2107  . [DISCONTINUED] polyethylene glycol (MIRALAX / GLYCOLAX) packet 17 g  17 g Oral Daily PRN Shellia Cleverly, MD      . [DISCONTINUED] rosuvastatin (CRESTOR) tablet 10 mg  10 mg Oral QHS Collene Gobble, MD   10 mg at 12/25/19 2140  . [DISCONTINUED] vitamin B-12 (CYANOCOBALAMIN) tablet 100 mcg  100 mcg Oral Daily Elgergawy, Silver Huguenin, MD   100 mcg at 12/26/19 [phone]  Current Outpatient Medications on File Prior to Encounter  Medication Sig Dispense Refill  . chlorthalidone (HYGROTON) 25 MG tablet Take 12.5 mg by mouth daily.    . cholecalciferol (VITAMIN D3) 25 MCG (1000 UNIT) tablet Take 1,000 Units by mouth daily.    Marland Kitchen escitalopram (LEXAPRO) 10 MG tablet Take 10 mg by mouth daily.    Marland Kitchen FARXIGA 10 MG TABS tablet Take 10 mg by mouth daily.    Marland Kitchen loperamide (IMODIUM) 2 MG capsule Take 2 mg by mouth at bedtime.    . rosuvastatin (CRESTOR) 20 MG tablet Take 20 mg by mouth at bedtime.    Marland Kitchen apixaban (ELIQUIS) 5 MG TABS tablet Please take 10 mg oral twice daily for next 5 days, then transition to 5 mg oral twice daily on 12/31/2019 70 tablet 0  . pantoprazole (PROTONIX) 40 MG tablet Take 1 tablet (40 mg total) by mouth daily. 30 tablet 2  . vitamin B-12 100 MCG tablet Take 1 tablet (100 mcg total) by mouth daily.    .  [DISCONTINUED] donepezil (ARICEPT) 10 MG tablet Take 10 mg by mouth daily.       Assessment: 79 y.o. female with recent PE, Eliquis on hold, for heparin.  Last dose of Eliquis yesterday morning.    Goal of Therapy:  APTT 66-102 sec Heparin level 0.3-0.7 units/ml Monitor platelets by anticoagulation protocol: Yes   Plan:  Start heparin 1200 units/hr APTT in 8 hours  Bruk [place], Bronson Curb 12/27/2019,3:15 AM

## 2019-12-27 NOTE — H&P (Signed)
History and Physical  Dawn Peterson BHA:[id number] DOB: 01-06-1941 DOA: 12/26/2019  Referring physician: Pryor Curia, DO  PCP: [facility]  Patient coming from: Home  Chief Complaint: Shortness of breath  HPI: Dawn Peterson is a 79 y.o. female with medical history significant for type II DM, hypertension who presents to the emergency department due to shortness of breath.  Patient was discharged yesterday (12/26/2019 after being treated for pulmonary embolism.  On discharge, patient was stable and was in no distress.  Shortly after she arrived at home, she developed worsening shortness of breath which worsens on exertion and this progressively got worse over the next few hours, EMS was activated and patient was taken to the ED for further evaluation and management.  ED Course:  In the emergency department, he was immediately tachycardic, however when patient ambulated several feet, O2 sats quickly dropped to 89%, she became tachypneic and tachycardic.  Work-up in the ED showed troponin 28> 27 (this was around 322-4 days ago; 540-5 days ago).  Hypoglycemia was also noted.  CT angio chest PE with or without contrast was consistent with clot burden but radiologist was unable to tell if there was a new thrombus.  Development of lingular infarct was also suspected.  Patient was started on IV heparin drip and hospitalist was asked to admit.  For further evaluation and management.  Review of Systems: Constitutional: Negative for chills and fever.  HENT: Negative for ear pain and sore throat.   Eyes: Negative for pain and visual disturbance.  Respiratory: Shortness of breath.  Negative for cough, chest tightness   Cardiovascular: Negative for chest pain and palpitations.  Gastrointestinal: Negative for abdominal pain and vomiting.  Endocrine: Negative for polyphagia and polyuria.  Genitourinary: Negative for decreased urine volume, dysuria, enuresis, hematuria, vaginal discharge  and vaginal pain.  Musculoskeletal: Negative for arthralgias and back pain.  Skin: Negative for color change and rash.  Allergic/Immunologic: Negative for immunocompromised state.  Neurological: Negative for tremors, syncope, speech difficulty, weakness, light-headedness and headaches.  Hematological: Does not bruise/bleed easily.  All other systems reviewed and are negative   Past Medical History:  Diagnosis Date   Diabetes mellitus without complication ([place])    Hypertension    History reviewed. No pertinent surgical history.  Social History:  reports that she has never smoked. She does not have any smokeless tobacco history on file. She reports previous alcohol use. No history on file for drug use.   Allergies  Allergen Reactions   Predicort [Prednisolone] Other (See Comments)    Per daughter "makes her crazy"   Penicillins Rash and Other (See Comments)    Childhood allergy, pt believes she broke out in her mouth    History reviewed. No pertinent family history.    Prior to Admission medications   Medication Sig Start Date End Date Taking? Authorizing Provider  chlorthalidone (HYGROTON) 25 MG tablet Take 12.5 mg by mouth daily. 11/30/19  Yes [provider]  cholecalciferol (VITAMIN D3) 25 MCG (1000 UNIT) tablet Take 1,000 Units by mouth daily.   Yes [provider]  escitalopram (LEXAPRO) 10 MG tablet Take 10 mg by mouth daily. 11/04/19  Yes [provider]  FARXIGA 10 MG TABS tablet Take 10 mg by mouth daily. 12/12/19  Yes [provider]  loperamide (IMODIUM) 2 MG capsule Take 2 mg by mouth at bedtime.   Yes [provider]  rosuvastatin (CRESTOR) 20 MG tablet Take 20 mg by mouth at bedtime. 11/03/19  Yes [provider]  apixaban (ELIQUIS) 5 MG TABS tablet Please take 10 mg oral twice daily for next 5 days, then transition to 5 mg oral twice daily on 12/31/2019 12/26/19   Elgergawy, Silver Huguenin, MD  pantoprazole (PROTONIX) 40  MG tablet Take 1 tablet (40 mg total) by mouth daily. 12/26/19   Elgergawy, Silver Huguenin, MD  vitamin B-12 100 MCG tablet Take 1 tablet (100 mcg total) by mouth daily. 12/27/19   Elgergawy, Silver Huguenin, MD    Physical Exam: BP (!) 147/70 (BP Location: Right Arm)    Pulse 94    Temp 98.3 F (36.8 C) (Oral)    Resp (!) 24    Ht 5\' 3"  (1.6 m)    Wt 94.2 kg    SpO2 98%    BMI 36.79 kg/m    General: 79 y.o. year-old female well developed well nourished in no acute distress.  Alert and oriented x3.  HEENT: NCAT, EOMI, PERRLA  Neck: Supple, trachea medial  Cardiovascular: Regular rate and rhythm with no rubs or gallops.  No thyromegaly or JVD noted.  No lower extremity edema. 2/4 pulses in all 4 extremities.  Respiratory: Clear to auscultation with no wheezes or rales.   Abdomen: Soft nontender nondistended with normal bowel sounds x4 quadrants.  Muskuloskeletal: No cyanosis, clubbing or edema noted bilaterally  Neuro: CN II-XII intact, strength, sensation, reflexes  Skin: No ulcerative lesions noted or rashes  Psychiatry: Judgement and insight appear normal. Mood is appropriate for condition and setting          Labs on Admission:  Basic Metabolic Panel: Recent Labs  Lab 12/23/19 1540 12/24/19 0259 12/25/19 0308 12/26/19 0509 12/26/19 2137  NA 138 138 136 137 137  K 3.3* 3.9 3.8 3.8 3.8  CL 111 113* 112* 109 107  CO2 19* 16* 17* 20* 21*  GLUCOSE 192* 167* 146* 168* 241*  BUN 26* 21 13 12 11   CREATININE 1.26* 1.15* 0.93 1.00 1.12*  CALCIUM 8.4* 8.4* 8.6* 8.9 9.2   Liver Function Tests: Recent Labs  Lab 12/22/19 2329 12/26/19 2137  AST 23 23  ALT 16 18  ALKPHOS 63 60  BILITOT 0.6 0.7  PROT 7.5 6.2*  ALBUMIN 3.3* 2.9*   No results for input(s): LIPASE, AMYLASE in the last 168 hours. No results for input(s): AMMONIA in the last 168 hours. CBC: Recent Labs  Lab 12/22/19 2329 12/22/19 2329 12/23/19 0530 12/24/19 0259 12/25/19 0308 12/26/19 0509 12/26/19 2137  WBC  15.9*   < > 10.7* 9.5 8.5 8.8 11.1*  NEUTROABS 11.1*  --   --   --   --   --   --   HGB 14.5   < > 12.0 10.5* 9.9* 10.0* 12.1  HCT 44.4   < > 37.5 33.0* 30.7* 30.9* 37.0  MCV 88.8   < > 88.9 89.7 88.5 86.8 88.5  PLT 297   < > 209 160 159 183 254   < > = values in this interval not displayed.   Cardiac Enzymes: No results for input(s): CKTOTAL, CKMB, CKMBINDEX, TROPONINI in the last 168 hours.  BNP (last 3 results) No results for input(s): BNP in the last 8760 hours.  ProBNP (last 3 results) No results for input(s): PROBNP in the last 8760 hours.  CBG: Recent Labs  Lab 12/25/19 0751 12/25/19 1135 12/25/19 1532 12/25/19 2201 12/26/19 0722  GLUCAP 150* 138* 169* 153* 153*    Radiological Exams on Admission: DG Chest 2 View  Result Date: 12/26/2019 CLINICAL DATA:  Shortness of breath. Discharged earlier this morning for pulmonary embolus. No episode of shortness of breath. EXAM: CHEST - 2 VIEW COMPARISON:  Radiograph 4 days ago, chest CT 3 days ago. FINDINGS: No significant change from prior radiographs. Unchanged heart size and mediastinal contours. Aortic atherosclerosis. No developing airspace disease. No large pleural effusion. No pneumothorax. Stable osseous structures. IMPRESSION: No acute radiographic findings. Electronically Signed   By: Keith Rake M.D.   On: 12/26/2019 21:18   CT ANGIO CHEST PE W OR WO CONTRAST  Result Date: 12/27/2019 CLINICAL DATA:  Recent massive PE, return of symptoms today EXAM: CT ANGIOGRAPHY CHEST WITH CONTRAST TECHNIQUE: Multidetector CT imaging of the chest was performed using the standard protocol during bolus administration of intravenous contrast. Multiplanar CT image reconstructions and MIPs were obtained to evaluate the vascular anatomy. CONTRAST:  157mL OMNIPAQUE IOHEXOL 350 MG/ML SOLN COMPARISON:  CT 12/23/2019, radiograph 12/26/2019 FINDINGS: Cardiovascular: Satisfactory opacification the pulmonary arteries. There are large bilateral  pulmonary arteries extending from the distal right and left main pulmonary arteries into the lobar and segmental branches throughout both lungs. The overall degree of embolic burden is similar to the comparison CT from 12/23/2019. Mild central pulmonary artery enlargement and flattening of the intraventricular septum is similar to the comparison. The degree of right ventricular dilatation is perhaps mildly improved with persistent elevation of the RV/LV ratio (1.4) to suggest residual right heart strain. Coronary artery calcifications are present. No pericardial effusion. Atherosclerotic plaque within the normal caliber aorta. No acute luminal abnormality nor periaortic stranding or hemorrhage. Normal 3 vessel branching of the aortic arch. Proximal great vessels mildly tortuous but otherwise unremarkable with a medialized course of the common carotid arteries. Major venous structures are free of acute abnormality. Mediastinum/Nodes: No mediastinal fluid or gas. Normal thyroid gland and thoracic inlet. No acute abnormality of the trachea or esophagus. No worrisome mediastinal, hilar or axillary adenopathy. Lungs/Pleura: Low lung volumes basilar atelectatic changes and additional areas of bandlike opacity likely reflecting further subsegmental atelectasis and/or scarring. More focal subpleural opacity in the lingula is increasingly confluent from the prior exam and given the distribution of the pulmonary emboli, could reflect developing infarcts. An additional similar area of subpleural consolidated lung is present in the posterior segment right upper lobe. No pneumothorax or effusion. No concerning pulmonary nodules or masses. Upper Abdomen: Gallbladder calcifications seen anti dependently could reflect mural calcification or adherent gallstones with a larger more convincing gallstone towards the gallbladder neck. Prominent fold in the gallbladder neck as well. No biliary ductal dilatation or calcified intraductal  gallstones. No acute abnormalities present in the visualized portions of the upper abdomen. Musculoskeletal: Exaggerated thoracic kyphosis with multilevel discogenic and facet degenerative changes including close is several midthoracic and lower thoracic vertebrae. No acute osseous abnormality or suspicious osseous lesion. Stable appearance of a subcutaneous cystic nodule along the right posterior chest wall measuring up to 1.5 cm, contiguous with the dermis, likely reflecting a small sebaceous/dermal inclusion cyst. No concerning chest wall lesions. Mild body wall edema. Review of the MIP images confirms the above findings. IMPRESSION: 1. Large bilateral pulmonary arteries extending from the distal right and left main pulmonary arteries into the lobar and segmental branches throughout both lungs. The overall degree of embolic burden is similar to the comparison CT from 12/23/2019. Burtis Junes most of the embolic burden to be related to the initial insult though some acute on subacute clot is not fully excluded. 2. The degree of right  ventricular dilatation is perhaps mildly improved with persistent elevation of the RV/LV ratio (1.4) suggestive of residual right heart strain. 3. Increasingly confluent subpleural opacity in the peripheral lingula and posterior segment right upper lobe, given distribution of pulmonary emboli, could reflect developing pulmonary infarcts. Infection less likely. Additional atelectatic changes throughout the lungs. 4. Cholelithiasis without evidence of acute cholecystitis. Additional mural calcifications versus adherent gallstones in the more distal gallbladder. Could consider further characterization with nonemergent right upper quadrant ultrasound. 5. Aortic Atherosclerosis (ICD10-I70.0). These results were called by telephone at the time of interpretation on 12/27/2019 at 2:21 am to provider Dr. Leonides Schanz, who verbally acknowledged these results. Electronically Signed   By: Lovena Le M.D.   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 Patient was on IV heparin drip Consider pulmonary and/or pulmonary rehabilitation for worsening symptoms  Hyperglycemia secondary to type II days mellitus Continue/sliding scale and hypoglycemia protocol  Essential hypertension BP meds noted on med rec, we shall await updated med rec  Hyperlipidemia Continue rosuvastatin per home regimen when med rec is updated  DVT prophylaxis: IV heparin drip  Code Status: Full code  Family Communication: Daughter at bedside (all questions answered to satisfaction)  Disposition Plan:  Patient is from:                        home Anticipated DC to:                   SNF or family members home Anticipated DC date:              24 hrs Anticipated DC barriers:           Response to treatment, currently requiring supplemental O2 via [place]   Consults called: None  Admission status: Observation    Bernadette Hoit MD [facility]  If 7PM-7AM, please contact night-coverage [email or web address]  12/27/2019, 4:18 AM

## 2019-12-27 NOTE — Plan of Care (Signed)

## 2019-12-27 NOTE — ED Notes (Signed)
Pt transported to CTA via stretcher 

## 2019-12-27 NOTE — Progress Notes (Signed)
[place] for Heparin Indication: pulmonary embolus  Allergies  Allergen Reactions  . Predicort [Prednisolone] Other (See Comments)    Per daughter "makes her crazy"  . Penicillins Rash and Other (See Comments)    Childhood allergy, pt believes she broke out in her mouth    Patient Measurements: Height: 5\' 3"  (160 cm) Weight: 93.5 kg (206 lb 2.1 oz) IBW/kg (Calculated) : 52.4 Heparin Dosing Weight: 75 kg  Vital Signs: Temp: 97.8 F (36.6 C) (07/03 1140) Temp Source: Oral (07/03 1140) BP: 128/45 (07/03 1140) Pulse Rate: 77 (07/03 1140)  Labs: Recent Labs    12/26/19 0509 12/26/19 0509 12/26/19 2137 12/26/19 2337 12/27/19 0400 12/27/19 0418 12/27/19 1120  HGB 10.0*   < > 12.1  --   --  11.2*  --   HCT 30.9*  --  37.0  --   --  35.0*  --   PLT 183  --  254  --   --  213  --   APTT  --   --   --   --   --  35 32  LABPROT  --   --   --   --   --  17.1*  --   INR  --   --   --   --   --  1.5*  --   HEPARINUNFRC  --   --   --   --  >2.20*  --   --   CREATININE 1.00  --  1.12*  --   --  0.97  --   TROPONINIHS  --   --  28* 27*  --   --   --    < > = values in this interval not displayed.    Estimated Creatinine Clearance: 52.8 mL/min (by C-G formula based on SCr of 0.97 mg/dL).  Assessment: 79 y.o. female with recent PE and was discharged on Eliquis on 12/26/19.  Patient returned with chest pain and SOB.  Pharmacy consulted to transition to IV heparin.  Currently using aPTT to guide heparin dosing.  APTT sub-therapeutic because heparin was off for an hour from 1000-1100 when IV infiltrated.  No bleeding per RN.  Goal of Therapy:  APTT 66-102 sec Heparin level 0.3-0.7 units/ml Monitor platelets by anticoagulation protocol: Yes   Plan:  Continue heparin gtt at 1200 units/hr Check 8 hr aPTT Daily heparin level, aPTT and CBC  Aadya Kindler D. Mina Marble, PharmD, BCPS, [place] 12/27/2019, 2:08 PM

## 2019-12-27 NOTE — Progress Notes (Signed)
PROGRESS NOTE    Dawn Peterson  VZD:[id number] DOB: 03-19-1941 DOA: 12/26/2019 PCP: [facility]   Chief Complaint  Patient presents with  . Shortness of Breath    Brief Narrative:  Dawn Peterson is Dawn Peterson 79 y.o. female with medical history significant for type II DM, hypertension who presents to the emergency department due to shortness of breath.  Patient was discharged yesterday (12/26/2019 after being treated for pulmonary embolism.  On discharge, patient was stable and was in no distress.  Shortly after she arrived at home, she developed worsening shortness of breath which worsens on exertion and this progressively got worse over the next few hours, EMS was activated and patient was taken to the ED for further evaluation and management.  ED Course:  In the emergency department, he was immediately tachycardic, however when patient ambulated several feet, O2 sats quickly dropped to 89%, she became tachypneic and tachycardic.  Work-up in the ED showed troponin 28> 27 (this was around 322-4 days ago; 540-5 days ago).  Hypoglycemia was also noted.  CT angio chest PE with or without contrast was consistent with clot burden but radiologist was unable to tell if there was Cat[place]ah Colborn new thrombus.  Development of lingular infarct was also suspected.  Patient was started on IV heparin drip and hospitalist was asked to admit.  For further evaluation and management.  Assessment & Plan:   Principal Problem:   Hypoxemic respiratory failure, chronic (HCC) Active Problems:   Pulmonary embolism (HCC)   Essential hypertension   Type 2 diabetes mellitus without complication (HCC)   Hyperglycemia due to type 2 diabetes mellitus (HCC)   Hyperlipidemia  Acute Hypoxemic Respiratory Failure 2/2 Pulmonary Embolism  Extensive Right Lower Extremity DVT:  Treated with tpa during last admission with hemodynamic instability Repeat CT scan here with similar degree of clot burden, though some acute on  subacute clot not excluded - mildly improved RV dilatation - developing pulmonary infarcts  Will continue heparin and plan to transition back to eliquis 7/4 Home o2 screen prior to discharge Pulm c/s, recommending anticoagulation   Hyperglycemia secondary to type II days mellitus Continue/sliding scale and hypoglycemia protocol Hold home meds  Essential hypertension Chlorthalidone  Hyperlipidemia Crestor  DVT prophylaxis: heparin gtt Code Status: full  Family Communication: none at bedside Disposition:   Status is: Observation  The patient remains OBS appropriate and will d/c before 2 midnights.  Dispo: The patient is from: Home              Anticipated d/c is to: Home              Anticipated d/c date is: 1 day              Patient currently is not medically stable to d/c.  Consultants:   Pulm  Procedures:  none  Antimicrobials:  Anti-infectives (From admission, onward)   None         Subjective: C/o SOB after being home Feels Dawn Peterson little better now  Objective: Vitals:   12/27/19 0415 12/27/19 0600 12/27/19 0800 12/27/19 1140  BP: (!) 135/55 135/79 (!) 141/65 (!) 128/45  Pulse: 77 79 67 77  Resp: 19 19 18  (!) 22  Temp:  98.3 F (36.8 C) 98.6 F (37 C) 97.8 F (36.6 C)  TempSrc:  Oral Oral Oral  SpO2: 98% 98% 99% 97%  Weight:  93.5 kg    Height:  5\' 3"  (1.6 m)      Intake/Output  Summary (Last 24 hours) at 12/27/2019 1306 Last data filed at 12/27/2019 1000 Gross per 24 hour  Intake 71.56 ml  Output --  Net 71.56 ml   Filed Weights   12/26/19 2045 12/27/19 0600  Weight: 94.2 kg 93.5 kg    Examination:  General exam: Appears calm and comfortable  Respiratory system: Clear to auscultation. Respiratory effort normal. Cardiovascular system: S1 & S2 heard, RRR.  Gastrointestinal system: Abdomen is nondistended, soft and nontender.  Central nervous system: Alert and oriented. No focal neurological deficits. Extremities: moving all  extremities Skin: No rashes, lesions or ulcers Psychiatry: Judgement and insight appear normal. Mood & affect appropriate.     Data Reviewed: I have personally reviewed following labs and imaging studies  CBC: Recent Labs  Lab 12/22/19 2329 12/23/19 0530 12/24/19 0259 12/25/19 0308 12/26/19 0509 12/26/19 2137 12/27/19 0418  WBC 15.9*   < > 9.5 8.5 8.8 11.1* 10.7*  NEUTROABS 11.1*  --   --   --   --   --   --   HGB 14.5   < > 10.5* 9.9* 10.0* 12.1 11.2*  HCT 44.4   < > 33.0* 30.7* 30.9* 37.0 35.0*  MCV 88.8   < > 89.7 88.5 86.8 88.5 89.3  PLT 297   < > 160 159 183 254 213   < > = values in this interval not displayed.    Basic Metabolic Panel: Recent Labs  Lab 12/24/19 0259 12/25/19 0308 12/26/19 0509 12/26/19 2137 12/27/19 0418  NA 138 136 137 137 136  K 3.9 3.8 3.8 3.8 3.8  CL 113* 112* 109 107 108  CO2 16* 17* 20* 21* 19*  GLUCOSE 167* 146* 168* 241* 199*  BUN 21 13 12 11 11   CREATININE 1.15* 0.93 1.00 1.12* 0.97  CALCIUM 8.4* 8.6* 8.9 9.2 9.0  MG  --   --   --   --  1.6*  PHOS  --   --   --   --  2.8    GFR: Estimated Creatinine Clearance: 52.8 mL/min (by C-G formula based on SCr of 0.97 mg/dL).  Liver Function Tests: Recent Labs  Lab 12/22/19 2329 12/26/19 2137 12/27/19 0418  AST 23 23 20   ALT 16 18 18   ALKPHOS 63 60 51  BILITOT 0.6 0.7 0.7  PROT 7.5 6.2* 6.1*  ALBUMIN 3.3* 2.9* 2.9*    CBG: Recent Labs  Lab 12/25/19 1135 12/25/19 1532 12/25/19 2201 12/26/19 0722 12/27/19 0628  GLUCAP 138* 169* 153* 153* 172*     Recent Results (from the past 240 hour(s))  SARS Coronavirus 2 by RT PCR (hospital order, performed in [facility] hospital lab) Nasopharyngeal Nasopharyngeal Swab     Status: None   Collection Time: 12/22/19 11:29 PM   Specimen: Nasopharyngeal Swab  Result Value Ref Range Status   SARS Coronavirus 2 NEGATIVE NEGATIVE Final    Comment: (NOTE) SARS-CoV-2 target nucleic acids are NOT DETECTED.  The SARS-CoV-2 RNA is  generally detectable in upper and lower respiratory specimens during the acute phase of infection. The lowest concentration of SARS-CoV-2 viral copies this assay can detect is 250 copies / mL. Dawn Peterson negative result does not preclude SARS-CoV-2 infection and should not be used as the sole basis for treatment or other patient management decisions.  Dawn Peterson negative result may occur with improper specimen collection / handling, submission of specimen other than nasopharyngeal swab, presence of viral mutation(s) within the areas targeted by this assay, and inadequate number of viral  copies (<250 copies / mL). Dawn Peterson negative result must be combined with clinical observations, patient history, and epidemiological information.  Fact Sheet for Patients:   StrictlyIdeas.no  Fact Sheet for Healthcare Providers: BankingDealers.co.za  This test is not yet approved or  cleared by the [place] FDA and has been authorized for detection and/or diagnosis of SARS-CoV-2 by FDA under an Emergency Use Authorization (EUA).  This EUA will remain in effect (meaning this test can be used) for the duration of the COVID-19 declaration under Section 564(b)(1) of the Act, 21 U.S.C. section 360bbb-3(b)(1), unless the authorization is terminated or revoked sooner.  Performed at [facility], [place] [address] [place], [place] [address]   MRSA PCR Screening     Status: None   Collection Time: 12/23/19  5:16 AM   Specimen: Nasal Mucosa; Nasopharyngeal  Result Value Ref Range Status   MRSA by PCR NEGATIVE NEGATIVE Final    Comment:        The GeneXpert MRSA Assay (FDA approved for NASAL specimens only), is one component of [place] [facility]lie[facility] comprehensive MRSA colonization surveillance program. It is not intended to diagnose MRSA infection nor to guide or monitor treatment for MRSA infections. Performed at [facility], [place] [address], [place], [place] [address]   Blood  Culture (routine x 2)     Status: None (Preliminary result)   Collection Time: 12/23/19  7:19 AM   Specimen: BLOOD RIGHT HAND  Result Value Ref Range Status   Specimen Description BLOOD RIGHT HAND  Final   Special Requests AEROBIC BOTTLE ONLY Blood Culture adequate volume  Final   Culture   Final    NO GROWTH 3 DAYS Performed at [facility], [place] [address], [place], [place] [address]    Report Status PENDING  Incomplete  Blood Culture (routine x 2)     Status: None (Preliminary result)   Collection Time: 12/23/19  7:25 AM   Specimen: BLOOD LEFT HAND  Result Value Ref Range Status   Specimen Description BLOOD LEFT HAND  Final   Special Requests   Final    AEROBIC BOTTLE ONLY Blood Culture results may not be optimal due to an inadequate volume of blood received in culture bottles   Culture   Final    NO GROWTH 3 DAYS Performed at [facility], [place] [address], [place], [place] [address]    Report Status PENDING  Incomplete  SARS Coronavirus 2 by RT PCR (hospital order, performed in [facility] hospital lab) Nasopharyngeal Nasopharyngeal Swab     Status: None   Collection Time: 12/27/19  3:05 AM   Specimen: Nasopharyngeal Swab  Result Value Ref Range Status   SARS Coronavirus 2 NEGATIVE NEGATIVE Final    Comment: (NOTE) SARS-CoV-2 target nucleic acids are NOT DETECTED.  The SARS-CoV-2 RNA is generally detectable in upper and lower respiratory specimens during the acute phase of infection. The lowest concentration of SARS-CoV-2 viral copies this assay can detect is 250 copies / mL. Dawn Peterson negative result does not preclude SARS-CoV-2 infection and should not be used as the sole basis for treatment or other patient management decisions.  Dawn Peterson negative result may occur with improper specimen collection / handling, submission of specimen other than nasopharyngeal swab, presence of viral mutation(s) within the areas targeted by this assay, and inadequate number of viral  copies (<250 copies / mL). Dawn Peterson negative result must be combined with clinical observations, patient history, and epidemiological information.  Fact Sheet for  Patients:   StrictlyIdeas.no  Fact Sheet for Healthcare Providers: BankingDealers.co.za  This test is not yet approved or  cleared by the [place] FDA and has been authorized for detection and/or diagnosis of SARS-CoV-2 by FDA under an Emergency Use Authorization (EUA).  This EUA will remain in effect (meaning this test can be used) for the duration of the COVID-19 declaration under Section 564(b)(1) of the Act, 21 U.S.C. section 360bbb-3(b)(1), unless the authorization is terminated or revoked sooner.  Performed at [facility], [place] [address] [place], [place] [address]          Radiology Studies: DG Chest 2 View  Result Date: 12/26/2019 CLINICAL DATA:  Shortness of breath. Discharged earlier this morning for pulmonary embolus. No episode of shortness of breath. EXAM: CHEST - 2 VIEW COMPARISON:  Radiograph 4 days ago, chest CT 3 days ago. FINDINGS: No significant change from prior radiographs. Unchanged heart size and mediastinal contours. Aortic atherosclerosis. No developing airspace disease. No large pleural effusion. No pneumothorax. Stable osseous structures. IMPRESSION: No acute radiographic findings. Electronically Signed   By: Keith Rake M.D.   On: 12/26/2019 21:18   CT ANGIO CHEST PE W OR WO CONTRAST  Result Date: 12/27/2019 CLINICAL DATA:  Recent massive PE, return of symptoms today EXAM: CT ANGIOGRAPHY CHEST WITH CONTRAST TECHNIQUE: Multidetector CT imaging of the chest was performed using the standard protocol during bolus administration of intravenous contrast. Multiplanar CT image reconstructions and MIPs were obtained to evaluate the vascular anatomy. CONTRAST:  147mL OMNIPAQUE IOHEXOL 350 MG/ML SOLN COMPARISON:  CT 12/23/2019, radiograph 12/26/2019  FINDINGS: Cardiovascular: Satisfactory opacification the pulmonary arteries. There are large bilateral pulmonary arteries extending from the distal right and left main pulmonary arteries into the lobar and segmental branches throughout both lungs. The overall degree of embolic burden is similar to the comparison CT from 12/23/2019. Mild central pulmonary artery enlargement and flattening of the intraventricular septum is similar to the comparison. The degree of right ventricular dilatation is perhaps mildly improved with persistent elevation of the RV/LV ratio (1.4) to suggest residual right heart strain. Coronary artery calcifications are present. No pericardial effusion. Atherosclerotic plaque within the normal caliber aorta. No acute luminal abnormality nor periaortic stranding or hemorrhage. Normal 3 vessel branching of the aortic arch. Proximal great vessels mildly tortuous but otherwise unremarkable with Dawn Peterson medialized course of the common carotid arteries. Major venous structures are free of acute abnormality. Mediastinum/Nodes: No mediastinal fluid or gas. Normal thyroid gland and thoracic inlet. No acute abnormality of the trachea or esophagus. No worrisome mediastinal, hilar or axillary adenopathy. Lungs/Pleura: Low lung volumes basilar atelectatic changes and additional areas of bandlike opacity likely reflecting further subsegmental atelectasis and/or scarring. More focal subpleural opacity in the lingula is increasingly confluent from the prior exam and given the distribution of the pulmonary emboli, could reflect developing infarcts. An additional similar area of subpleural consolidated lung is present in the posterior segment right upper lobe. No pneumothorax or effusion. No concerning pulmonary nodules or masses. Upper Abdomen: Gallbladder calcifications seen anti dependently could reflect mural calcification or adherent gallstones with Dawn Peterson larger more convincing gallstone towards the gallbladder neck.  Prominent fold in the gallbladder neck as well. No biliary ductal dilatation or calcified intraductal gallstones. No acute abnormalities present in the visualized portions of the upper abdomen. Musculoskeletal: Exaggerated thoracic kyphosis with multilevel discogenic and facet degenerative changes including close is several midthoracic and lower thoracic vertebrae. No acute osseous abnormality or suspicious osseous lesion. Stable appearance  of Dawn Peterson subcutaneous cystic nodule along the right posterior chest wall measuring up to 1.5 cm, contiguous with the dermis, likely reflecting Dawn Peterson small sebaceous/dermal inclusion cyst. No concerning chest wall lesions. Mild body wall edema. Review of the MIP images confirms the above findings. IMPRESSION: 1. Large bilateral pulmonary arteries extending from the distal right and left main pulmonary arteries into the lobar and segmental branches throughout both lungs. The overall degree of embolic burden is similar to the comparison CT from 12/23/2019. Dawn Peterson most of the embolic burden to be related to the initial insult though some acute on subacute clot is not fully excluded. 2. The degree of right ventricular dilatation is perhaps mildly improved with persistent elevation of the RV/LV ratio (1.4) suggestive of residual right heart strain. 3. Increasingly confluent subpleural opacity in the peripheral lingula and posterior segment right upper lobe, given distribution of pulmonary emboli, could reflect developing pulmonary infarcts. Infection less likely. Additional atelectatic changes throughout the lungs. 4. Cholelithiasis without evidence of acute cholecystitis. Additional mural calcifications versus adherent gallstones in the more distal gallbladder. Could consider further characterization with nonemergent right upper quadrant ultrasound. 5. Aortic Atherosclerosis (ICD10-I70.0). These results were called by telephone at the time of interpretation on 12/27/2019 at 2:21 am to provider  Dr. Leonides Schanz, who verbally acknowledged these results. Electronically Signed   By: Lovena Le M.D.   On: 12/27/2019 02:22        Scheduled Meds: . rosuvastatin  20 mg Oral QHS   Continuous Infusions: . heparin 1,200 Units/hr (12/27/19 1100)     LOS: 0 days    Time spent: over 30 min    Fayrene Helper, MD [facility]   To contact the attending provider between 7A-7P or the covering provider during after hours 7P-7A, please log into the web site [email or web address] and access using universal [place] password for that web site. If you do not have the password, please call the hospital operator.  12/27/2019, 1:06 PM

## 2019-12-27 NOTE — Consult Note (Addendum)
NAME:  Dawn Peterson, MRN:  [id number], DOB:  1941/06/02, LOS: 0 ADMISSION DATE:  12/26/2019, CONSULTATION DATE:  12/27/19 REFERRING MD:  Josephine Cables, CHIEF COMPLAINT:  Massive PE   Brief History   35 yof recently admitted 6/29 for submassive PE (received tPA>heparin) and subsequently discharged 7/2 on eliquis. She was reportedly stable at discharge. She returned to the ED a few hours after discharge for worsened shortness of breath. CTA with similar findings from 6/29 CTA showing bilateral PE with right heart strain. Admitted to [facility] who consulted PCCM for further evaluation.  History of present illness   66 yof with HTN, T2DM who was recently admitted 6/29-12/26/19 for submassive PE during which time she received therapy with tPA and heparin. She was discharged on eliquis on day prior to admission. Shortly after returning home, she developed progressive dypsnea and returned to the ED for further eval.  Repeat CTA similar to prior, evidencing large b/l PE's with evidence of right heart strain. She was admitted to [facility] for further evaluation  No prior history of blood clots. No FH of clotting disorders. Denies any recent travel, surgery or extended period of immobilization.  She does endorse diminished appetite and severe fatigue since March which has been worked up by her PCP. Denies melena/hematachezia.   Past Medical History  HTN, T2DM  Significant Hospital Events   7/3 admission  Consults:  PCCM  Procedures:  none  Significant Diagnostic Tests:  6/29 CTA chest>>large b/l PE with evidence of right heart strain (RV/LV ratio 3.9) consistent with at least submassive PE 6/29 DVT US>> Findings consistent with age indeterminate deep vein thrombosis involving the right common femoral vein, right proximal profunda vein, right popliteal vein, right posterior tibial veins, and right peroneal  veins. No evidence of DVT in LLE   7/3 CTA chest>>large b/l PE's extending from distal right and left main  pulm arteries into lobar and segmental branches throughout both lungs. Overall degree of embolic burden similar to 6/29. Favor most embolic burden to be related to initial insult though some acute on subacute clot is not fully excluded. RV/LV ratio 1.4 suggestive of right heart strain suggestive of residual right heart strain. Increasingly confulent subpleural opacity in peripheral lingula and posterior segment of RUL that could reflect developing pulm infarcts.   Micro Data:  COVID neg  Antimicrobials:  none  Interim history/subjective:  See above  Objective   Blood pressure (!) 141/65, pulse 67, temperature 98.6 F (37 C), temperature source Oral, resp. rate 18, height 5\' 3"  (1.6 m), weight 93.5 kg, SpO2 99 %.       No intake or output data in the 24 hours ending 12/27/19 0857 Filed Weights   12/26/19 2045 12/27/19 0600  Weight: 94.2 kg 93.5 kg    Examination: General: well appearing female in NAD HENT: NCAT Lungs: breathing comfortably on 2L [place]. bibasilar course crackles L>R.  Cardiovascular: extremities warm. RRR.  Abdomen: nondistended Extremities: warm.  Neuro: a/o Skin: no rash  Resolved Hospital Problem list   n/a  Assessment & Plan:   Submassive PE secondary to RLE DVT. CTA suggesting improvement in right heart strain in comparison to 6/29. RV/LV ratio 3.9>1.4. hemodynamically stable. On 2L supplemental oxygen at this time. Already received tPA on 6/29 and right heart strain appears to be improving so would not see indication for repeating tPA at this time. Leukocytosis likely reactive to PE.  Currently on heparin gtt CTA unable to exclude acute clot but favors subacute prior prior  PE. The improvement in RV/LV ratio also reassuring--less likely clot propogation. She may have had worsening symptoms in the setting of increased exertion at home. CTA also suggesting some potential infarcts involving lingula and RUL. Unclear etiology of her VTE. No significant risk  factors--non-smoker, no recent extended periods of immobilization, no prior clots or FH thereof. Her decreased appetite and fatigue have a broad ddx, however in the setting of VTE, cancer is of concern. No prior history of colonoscopy. Plan: Recommend transition back to oral anticoagulation. PT/OT for further evaluation of exertion limitations. Discussed recommendations for age appropriate cancer screening which can be assisted by her PCP.   Best practice:  Diet: HH Pain/Anxiety/Delirium protocol (if indicated): n/a VAP protocol (if indicated): n/a DVT prophylaxis: heparin gtt GI prophylaxis: n/a Glucose control: per primary Mobility: per primary Code Status: full Family Communication: per primary  Labs   CBC: Recent Labs  Lab 12/22/19 2329 12/23/19 0530 12/24/19 0259 12/25/19 0308 12/26/19 0509 12/26/19 2137 12/27/19 0418  WBC 15.9*   < > 9.5 8.5 8.8 11.1* 10.7*  NEUTROABS 11.1*  --   --   --   --   --   --   HGB 14.5   < > 10.5* 9.9* 10.0* 12.1 11.2*  HCT 44.4   < > 33.0* 30.7* 30.9* 37.0 35.0*  MCV 88.8   < > 89.7 88.5 86.8 88.5 89.3  PLT 297   < > 160 159 183 254 213   < > = values in this interval not displayed.    Basic Metabolic Panel: Recent Labs  Lab 12/24/19 0259 12/25/19 0308 12/26/19 0509 12/26/19 2137 12/27/19 0418  NA 138 136 137 137 136  K 3.9 3.8 3.8 3.8 3.8  CL 113* 112* 109 107 108  CO2 16* 17* 20* 21* 19*  GLUCOSE 167* 146* 168* 241* 199*  BUN 21 13 12 11 11   CREATININE 1.15* 0.93 1.00 1.12* 0.97  CALCIUM 8.4* 8.6* 8.9 9.2 9.0  MG  --   --   --   --  1.6*  PHOS  --   --   --   --  2.8   GFR: Estimated Creatinine Clearance: 52.8 mL/min (by C-G formula based on SCr of 0.97 mg/dL). Recent Labs  Lab 12/23/19 0144 12/23/19 0530 12/23/19 2302 12/24/19 0259 12/25/19 0308 12/26/19 0509 12/26/19 2137 12/27/19 0418  WBC  --    < >  --    < > 8.5 8.8 11.1* 10.7*  LATICACIDVEN 2.2*  --  1.4  --   --   --   --   --    < > = values in this  interval not displayed.    Liver Function Tests: Recent Labs  Lab 12/22/19 2329 12/26/19 2137 12/27/19 0418  AST 23 23 20   ALT 16 18 18   ALKPHOS 63 60 51  BILITOT 0.6 0.7 0.7  PROT 7.5 6.2* 6.1*  ALBUMIN 3.3* 2.9* 2.9*   No results for input(s): LIPASE, AMYLASE in the last 168 hours. No results for input(s): AMMONIA in the last 168 hours.  ABG    Component Value Date/Time   TCO2 25 08/19/2008 1758     Coagulation Profile: Recent Labs  Lab 12/23/19 0530 12/27/19 0418  INR 1.3* 1.5*    Cardiac Enzymes: No results for input(s): CKTOTAL, CKMB, CKMBINDEX, TROPONINI in the last 168 hours.  HbA1C: Hgb A1c MFr Bld  Date/Time Value Ref Range Status  12/23/2019 05:30 AM 9.1 (H) 4.8 - 5.6 %  Final    Comment:    (NOTE) Pre diabetes:          5.7%-6.4%  Diabetes:              >6.4%  Glycemic control for   <7.0% adults with diabetes     CBG: Recent Labs  Lab 12/25/19 1135 12/25/19 1532 12/25/19 2201 12/26/19 0722 12/27/19 0628  GLUCAP 138* 169* 153* 153* 172*    Review of Systems:   ROS negative unless stated in HPI  Past Medical History  She,  has a past medical history of Diabetes mellitus without complication ([place]) and Hypertension.   Surgical History   History reviewed. No pertinent surgical history.   Social History   reports that she has never smoked. She does not have any smokeless tobacco history on file. She reports previous alcohol use.   Family History   Her family history is not on file.   Allergies Allergies  Allergen Reactions  . Predicort [Prednisolone] Other (See Comments)    Per daughter "makes her crazy"  . Penicillins Rash and Other (See Comments)    Childhood allergy, pt believes she broke out in her mouth     Home Medications  Prior to Admission medications   Medication Sig Start Date End Date Taking? Authorizing Provider  chlorthalidone (HYGROTON) 25 MG tablet Take 12.5 mg by mouth daily. 11/30/19  Yes [provider]  cholecalciferol (VITAMIN D3) 25 MCG (1000 UNIT) tablet Take 1,000 Units by mouth daily.   Yes [provider]  escitalopram (LEXAPRO) 10 MG tablet Take 10 mg by mouth daily. 11/04/19  Yes [provider]  FARXIGA 10 MG TABS tablet Take 10 mg by mouth daily. 12/12/19  Yes [provider]  loperamide (IMODIUM) 2 MG capsule Take 2 mg by mouth at bedtime.   Yes [provider]  rosuvastatin (CRESTOR) 20 MG tablet Take 20 mg by mouth at bedtime. 11/03/19  Yes [provider]  apixaban (ELIQUIS) 5 MG TABS tablet Please take 10 mg oral twice daily for next 5 days, then transition to 5 mg oral twice daily on 12/31/2019 12/26/19   Elgergawy, Silver Huguenin, MD  pantoprazole (PROTONIX) 40 MG tablet Take 1 tablet (40 mg total) by mouth daily. 12/26/19   Elgergawy, Silver Huguenin, MD  vitamin B-12 100 MCG tablet Take 1 tablet (100 mcg total) by mouth daily. 12/27/19   Elgergawy, Silver Huguenin, MD     Mitzi Hansen, MD INTERNAL MEDICINE RESIDENT PGY-2 12/27/19  8:57 AM

## 2019-12-28 LAB — CULTURE, BLOOD (ROUTINE X 2)
Culture: NO GROWTH
Culture: NO GROWTH
Special Requests: ADEQUATE

## 2019-12-28 LAB — COMPREHENSIVE METABOLIC PANEL
ALT: 16 U/L (ref 0–44)
AST: 17 U/L (ref 15–41)
Albumin: 2.7 g/dL — ABNORMAL LOW (ref 3.5–5.0)
Alkaline Phosphatase: 49 U/L (ref 38–126)
Anion gap: 7 (ref 5–15)
BUN: 10 mg/dL (ref 8–23)
CO2: 23 mmol/L (ref 22–32)
Calcium: 9.1 mg/dL (ref 8.9–10.3)
Chloride: 108 mmol/L (ref 98–111)
Creatinine, Ser: 0.87 mg/dL (ref 0.44–1.00)
GFR calc Af Amer: >60 mL/min (ref >60)
GFR calc non Af Amer: >60 mL/min (ref >60)
Glucose, Bld: 155 mg/dL — ABNORMAL HIGH (ref 70–99)
Potassium: 3.7 mmol/L (ref 3.5–5.1)
Sodium: 138 mmol/L (ref 135–145)
Total Bilirubin: 0.7 mg/dL (ref 0.3–1.2)
Total Protein: 5.9 g/dL — ABNORMAL LOW (ref 6.5–8.1)

## 2019-12-28 LAB — CBC
HCT: 31.9 % — ABNORMAL LOW (ref 36.0–46.0)
Hemoglobin: 10.6 g/dL — ABNORMAL LOW (ref 12.0–15.0)
MCH: 29.1 pg (ref 26.0–34.0)
MCHC: 33.2 g/dL (ref 30.0–36.0)
MCV: 87.6 fL (ref 80.0–100.0)
Platelets: 242 10*3/uL (ref 150–400)
RBC: 3.64 MIL/uL — ABNORMAL LOW (ref 3.87–5.11)
RDW: 13.9 % (ref 11.5–15.5)
WBC: 10.1 10*3/uL (ref 4.0–10.5)
nRBC: 0 % (ref 0.0–0.2)

## 2019-12-28 LAB — APTT
aPTT: 48 seconds — ABNORMAL HIGH (ref 24–36)
aPTT: 124 seconds — ABNORMAL HIGH (ref 24–36)

## 2019-12-28 LAB — GLUCOSE, CAPILLARY
Glucose-Capillary: 148 mg/dL — ABNORMAL HIGH (ref 70–99)
Glucose-Capillary: 157 mg/dL — ABNORMAL HIGH (ref 70–99)
Glucose-Capillary: 185 mg/dL — ABNORMAL HIGH (ref 70–99)
Glucose-Capillary: 166 mg/dL — ABNORMAL HIGH (ref 70–99)

## 2019-12-28 LAB — PHOSPHORUS: Phosphorus: 3.5 mg/dL (ref 2.5–4.6)

## 2019-12-28 LAB — MAGNESIUM: Magnesium: 1.7 mg/dL (ref 1.7–2.4)

## 2019-12-28 LAB — HEPARIN LEVEL (UNFRACTIONATED): Heparin Unfractionated: 1.2 IU/mL — ABNORMAL HIGH (ref 0.30–0.70)

## 2019-12-28 MED ORDER — APIXABAN 5 MG PO TABS: 5.0000 mg | ORAL_TABLET | Freq: Two times a day (BID) | ORAL | Status: DC

## 2019-12-28 MED ORDER — APIXABAN 5 MG PO TABS
10.0000 mg | ORAL_TABLET | Freq: Two times a day (BID) | ORAL | Status: DC
  Administered 2019-12-28 – 2019-12-30 (×5): 10 mg via ORAL
  Filled 2019-12-28 (×4): qty 2

## 2019-12-28 NOTE — Evaluation (Signed)
Physical Therapy Evaluation Patient Details Name: Dawn Peterson MRN: [id number] DOB: [birth year] Today's Date: 12/28/2019   History of Present Illness  79 y.o. female with medical history significant for type II DM, hypertension who presents to the emergency department due to shortness of breath.  Patient discharged from [facility] 12/26/2019 after being treated for pulmonary embolism. Upon returning home developed SoB and returned to ED where she was found to be tachypenic and tachycardic. CT angio chest PE  consistent with clot burden but radiologist was unable to tell if there was a new thrombus.  Development of lingular infarct was also suspected. Also found to be hypoglycemic. Started on Heparin drip and admitted 12/26/19 for treatment of Hypoxemic respiratory failure secondary to pulmonary embolism.  Clinical Impression  Pt is limited in safe mobility by increased anxiety with mobility out of bed. Pt on 2L O2 via [place] on entry with SaO2 100%O2 with ambulation dropped to 96%O2, however HR increased to 148 bpm and RR 34. Pt cued in deep breathing and relaxation with minimal success, once back in room cued in deep breathing to practice while sitting in recliner to employ with ambulation with RN this afternoon. Pt is currently mod I for bed mobility, supervision for transfers and supervision for ambulation with RW. PT recommends HHPT to improve strength and endurance for generalized weakness from 2x hospitalization. PT will continue to follow acutely.     Follow Up Recommendations Home health PT;Supervision - Intermittent    Equipment Recommendations  None recommended by PT       Precautions / Restrictions Precautions Precautions: None Restrictions Weight Bearing Restrictions: No      Mobility  Bed Mobility Overal bed mobility: Modified Independent Bed Mobility: Supine to Sit     Supine to sit: Modified independent (Device/Increase time)     General bed mobility comments: increased  time and effort, head of bed flattened  Transfers Overall transfer level: Needs assistance Equipment used: None Transfers: Sit to/from Stand Sit to Stand: Supervision         General transfer comment: supervision for power up from bed and from [facility], good self steadying  Ambulation/Gait Ambulation/Gait assistance: Supervision;Min guard Gait Distance (Feet): 80 Feet Assistive device: None;Rolling walker (2 wheeled) Gait Pattern/deviations: Step-through pattern Gait velocity: slower Gait velocity interpretation: <1.31 ft/sec, indicative of household ambulator General Gait Details: min guard for safety with initial ambulation of 10 feet without AD, however pt utilizing UE on wall and trashcan, after 10 feet ambulation pt requires seated rest break for increased HR and RR, provided pt with RW, and able to ambulate with supervision, pt requires standing rest break after 50 feet ambulation cuing for deep breathing, able to continue back to room however increased velocity in last 15 feet, cued on slowing down gait and breath      Balance Overall balance assessment: Needs assistance   Sitting balance-Leahy Scale: Good       Standing balance-Leahy Scale: Good                               Pertinent Vitals/Pain Pain Assessment: Faces Faces Pain Scale: Hurts a little bit Pain Location: back and knee OA Pain Descriptors / Indicators: Grimacing;Sore Pain Intervention(s): Limited activity within patient's tolerance;Monitored during session;Repositioned;RN gave pain meds during session    Home Living Family/patient expects to be discharged to:: Private residence Living Arrangements: Spouse/significant other Available Help at Discharge: Family;Available 24 hours/day Type of  Home: House Home Access: Stairs to enter   [employer] of Steps: 1 Home Layout: Two level;Able to live on main level with bedroom/bathroom Home Equipment: Gilford Rile - 2 wheels;Cane - single  point      Prior Function Level of Independence: Independent                  Extremity/Trunk Assessment   Upper Extremity Assessment Upper Extremity Assessment: Generalized weakness    Lower Extremity Assessment Lower Extremity Assessment: Generalized weakness       Communication   Communication: No difficulties  Cognition Arousal/Alertness: Awake/alert Behavior During Therapy: Anxious Overall Cognitive Status: Within Functional Limits for tasks assessed                                 General Comments: pt very anxious with all mobility despite adequate oxygenation, cued in deep breathing       General Comments General comments (skin integrity, edema, etc.): At rest pt with HR 80 bpm, 100%SaO2 on 2L O2 via [place], with ambulation max noted HR 148bpm, RR 34, SaO2 96%O2 on 2L O2 via [place]    Exercises Other Exercises Other Exercises: deep breathing exercises at rest   Assessment/Plan    PT Assessment All further PT needs can be met in the next venue of care  PT Problem List Decreased mobility;Decreased activity tolerance;Decreased strength;Decreased balance       PT Treatment Interventions      PT Goals (Current goals can be found in the Care Plan section)  Acute Rehab PT Goals Patient Stated Goal: home and get HHPT to help strengthen up. PT Goal Formulation: With patient Time For Goal Achievement: 01/11/20 Potential to Achieve Goals: Good    Frequency     Barriers to discharge        Co-evaluation               AM-PAC PT "6 Clicks" Mobility  Outcome Measure Help needed turning from your back to your side while in a flat bed without using bedrails?: A Little Help needed moving from lying on your back to sitting on the side of a flat bed without using bedrails?: A Little Help needed moving to and from a bed to a chair (including a wheelchair)?: A Little Help needed standing up from a chair using your arms (e.g., wheelchair or bedside  chair)?: A Little Help needed to walk in hospital room?: A Little Help needed climbing 3-5 steps with a railing? : A Little 6 Click Score: 18    End of Session Equipment Utilized During Treatment: Gait belt;Oxygen Activity Tolerance: Patient tolerated treatment well (some fatigue) Patient left: in chair;with call bell/phone within reach;with chair alarm set;with family/visitor present Nurse Communication: Mobility status PT Visit Diagnosis: Unsteadiness on feet (R26.81)    Time: 0812-0901 PT Time Calculation (min) (ACUTE ONLY): 49 min   Charges:   PT Evaluation $PT Eval Moderate Complexity: 1 Mod PT Treatments $Gait Training: 8-22 mins $Therapeutic Exercise: 8-22 mins        Candice Tobey B. Migdalia Dk PT, DPT Acute Rehabilitation Services Pager [phone] Office [phone]   [place] 12/28/2019, 9:15 AM

## 2019-12-28 NOTE — Progress Notes (Signed)
PROGRESS NOTE    Dawn Peterson  NFA:[id number] DOB: February 08, 1941 DOA: 12/26/2019 PCP: [facility]   Chief Complaint  Patient presents with  . Shortness of Breath    Brief Narrative:  Dawn Peterson is Dawn Peterson 79 y.o. female with medical history significant for type II DM, hypertension who presents to the emergency department due to shortness of breath.  Patient was discharged yesterday (12/26/2019 after being treated for pulmonary embolism.  On discharge, patient was stable and was in no distress.  Shortly after she arrived at home, she developed worsening shortness of breath which worsens on exertion and this progressively got worse over the next few hours, EMS was activated and patient was taken to the ED for further evaluation and management.  ED Course:  In the emergency department, he was immediately tachycardic, however when patient ambulated several feet, O2 sats quickly dropped to 89%, she became tachypneic and tachycardic.  Work-up in the ED showed troponin 28> 27 (this was around 322-4 days ago; 540-5 days ago).  Hypoglycemia was also noted.  CT angio chest PE with or without contrast was consistent with clot burden but radiologist was unable to tell if there was Dawn Peterson new thrombus.  Development of lingular infarct was also suspected.  Patient was started on IV heparin drip and hospitalist was asked to admit.  For further evaluation and management.  Assessment & Plan:   Principal Problem:   Hypoxemic respiratory failure, chronic (HCC) Active Problems:   Pulmonary embolism (HCC)   Essential hypertension   Type 2 diabetes mellitus without complication (HCC)   Hyperglycemia due to type 2 diabetes mellitus (HCC)   Hyperlipidemia  Acute Hypoxemic Respiratory Failure 2/2 Pulmonary Embolism  Extensive Right Lower Extremity DVT:  Treated with tpa during last admission with hemodynamic instability Repeat CT scan here with similar degree of clot burden, though some acute on  subacute clot not excluded - mildly improved RV dilatation - developing pulmonary infarcts  Will continue heparin and plan to transition back to eliquis 7/4 Home o2 screen prior to discharge Pulm c/s, recommending anticoagulation, rather than repeating thrombolytics or catheter directed thrombectomy - consider reconsult if clinical trajectory worsens Compression stocking, elevation   Hyperglycemia secondary to type II days mellitus Continue/sliding scale and hypoglycemia protocol Hold home meds  Essential hypertension Chlorthalidone  Hyperlipidemia Crestor  CT scan with cholelithiasis and additional mural calcifications vs adherent gallstones: needs RUQ [place] outpatient  DVT prophylaxis: heparin gtt Code Status: full  Family Communication: none at bedside Disposition:   Status is: Observation  The patient remains OBS appropriate and will d/c before 2 midnights.  Dispo: The patient is from: Home              Anticipated d/c is to: Home              Anticipated d/c date is: 1 day              Patient currently is not medically stable to d/c.  Consultants:   Pulm  Procedures:  none  Antimicrobials:  Anti-infectives (From admission, onward)   None         Subjective: Gets sob with activity   Objective: Vitals:   12/27/19 2315 12/28/19 0335 12/28/19 0800 12/28/19 1200  BP: 128/61 (!) 115/58  140/63  Pulse: 82 77 85   Resp: 20 18 17    Temp: 98.7 F (37.1 C) 98 F (36.7 C)  98.3 F (36.8 C)  TempSrc: Oral Oral  Oral  SpO2: 96% 100% 98% 98%  Weight:      Height:        Intake/Output Summary (Last 24 hours) at 12/28/2019 1419 Last data filed at 12/28/2019 1200 Gross per 24 hour  Intake 960 ml  Output 500 ml  Net 460 ml   Filed Weights   12/26/19 2045 12/27/19 0600  Weight: 94.2 kg 93.5 kg    Examination:  General: No acute distress. Cardiovascular: Heart sounds show Bill Yohn regular rate, and rhythm.  Lungs: Clear to auscultation bilaterally  Abdomen:  Soft, nontender, nondistended Neurological: Alert and oriented 3. Moves all extremities 4 . Cranial nerves II through XII grossly intact. Skin: Warm and dry. No rashes or lesions. Extremities: No clubbing or cyanosis.    Data Reviewed: I have personally reviewed following labs and imaging studies  CBC: Recent Labs  Lab 12/22/19 2329 12/23/19 0530 12/25/19 0308 12/26/19 0509 12/26/19 2137 12/27/19 0418 12/28/19 0305  WBC 15.9*   < > 8.5 8.8 11.1* 10.7* 10.1  NEUTROABS 11.1*  --   --   --   --   --   --   HGB 14.5   < > 9.9* 10.0* 12.1 11.2* 10.6*  HCT 44.4   < > 30.7* 30.9* 37.0 35.0* 31.9*  MCV 88.8   < > 88.5 86.8 88.5 89.3 87.6  PLT 297   < > 159 183 254 213 242   < > = values in this interval not displayed.    Basic Metabolic Panel: Recent Labs  Lab 12/25/19 0308 12/26/19 0509 12/26/19 2137 12/27/19 0418 12/28/19 0305  NA 136 137 137 136 138  K 3.8 3.8 3.8 3.8 3.7  CL 112* 109 107 108 108  CO2 17* 20* 21* 19* 23  GLUCOSE 146* 168* 241* 199* 155*  BUN 13 12 11 11 10   CREATININE 0.93 1.00 1.12* 0.97 0.87  CALCIUM 8.6* 8.9 9.2 9.0 9.1  MG  --   --   --  1.6* 1.7  PHOS  --   --   --  2.8 3.5    GFR: Estimated Creatinine Clearance: 58.8 mL/min (by C-G formula based on SCr of 0.87 mg/dL).  Liver Function Tests: Recent Labs  Lab 12/22/19 2329 12/26/19 2137 12/27/19 0418 12/28/19 0305  AST 23 23 20 17   ALT 16 18 18 16   ALKPHOS 63 60 51 49  BILITOT 0.6 0.7 0.7 0.7  PROT 7.5 6.2* 6.1* 5.9*  ALBUMIN 3.3* 2.9* 2.9* 2.7*    CBG: Recent Labs  Lab 12/27/19 0628 12/27/19 1647 12/27/19 2103 12/28/19 0607 12/28/19 1119  GLUCAP 172* 196* 153* 148* 157*     Recent Results (from the past 240 hour(s))  SARS Coronavirus 2 by RT PCR (hospital order, performed in [facility] hospital lab) Nasopharyngeal Nasopharyngeal Swab     Status: None   Collection Time: 12/22/19 11:29 PM   Specimen: Nasopharyngeal Swab  Result Value Ref Range Status   SARS  Coronavirus 2 NEGATIVE NEGATIVE Final    Comment: (NOTE) SARS-CoV-2 target nucleic acids are NOT DETECTED.  The SARS-CoV-2 RNA is generally detectable in upper and lower respiratory specimens during the acute phase of infection. The lowest concentration of SARS-CoV-2 viral copies this assay can detect is 250 copies / mL. Eldrige Pitkin negative result does not preclude SARS-CoV-2 infection and should not be used as the sole basis for treatment or other patient management decisions.  Lorcan Shelp negative result may occur with improper specimen collection / handling, submission of specimen other than nasopharyngeal  swab, presence of viral mutation(s) within the areas targeted by this assay, and inadequate number of viral copies (<250 copies / mL). Pavel Gadd negative result must be combined with clinical observations, patient history, and epidemiological information.  Fact Sheet for Patients:   StrictlyIdeas.no  Fact Sheet for Healthcare Providers: BankingDealers.co.za  This test is not yet approved or  cleared by the [place] FDA and has been authorized for detection and/or diagnosis of SARS-CoV-2 by FDA under an Emergency Use Authorization (EUA).  This EUA will remain in effect (meaning this test can be used) for the duration of the COVID-19 declaration under Section 564(b)(1) of the Act, 21 U.S.C. section 360bbb-3(b)(1), unless the authorization is terminated or revoked sooner.  Performed at [facility], [place] [address] [place], [place] [address]   MRSA PCR Screening     Status: None   Collection Time: 12/23/19  5:16 AM   Specimen: Nasal Mucosa; Nasopharyngeal  Result Value Ref Range Status   MRSA by PCR NEGATIVE NEGATIVE Final    Comment:        The GeneXpert MRSA Assay (FDA approved for NASAL specimens only), is one component of Andros Channing comprehensive MRSA colonization surveillance program. It is not intended to diagnose MRSA infection nor to guide  or monitor treatment for MRSA infections. Performed at [facility], [place] [address], [place], [place] [address]   Blood Culture (routine x 2)     Status: None   Collection Time: 12/23/19  7:19 AM   Specimen: BLOOD RIGHT HAND  Result Value Ref Range Status   Specimen Description BLOOD RIGHT HAND  Final   Special Requests   Final    BOTTLES DRAWN AEROBIC ONLY Blood Culture adequate volume   Culture   Final    NO GROWTH 5 DAYS Performed at [facility], [place] [address], [place], [place] [address]    Report Status 12/28/2019 FINAL  Final  Blood Culture (routine x 2)     Status: None   Collection Time: 12/23/19  7:25 AM   Specimen: BLOOD LEFT HAND  Result Value Ref Range Status   Specimen Description BLOOD LEFT HAND  Final   Special Requests   Final    BOTTLES DRAWN AEROBIC ONLY Blood Culture results may not be optimal due to an inadequate volume of blood received in culture bottles   Culture   Final    NO GROWTH 5 DAYS Performed at [facility], [place] [address] [place], [place] [address]    Report Status 12/28/2019 FINAL  Final  SARS Coronavirus 2 by RT PCR (hospital order, performed in [facility] hospital lab) Nasopharyngeal Nasopharyngeal Swab     Status: None   Collection Time: 12/27/19  3:05 AM   Specimen: Nasopharyngeal Swab  Result Value Ref Range Status   SARS Coronavirus 2 NEGATIVE NEGATIVE Final    Comment: (NOTE) SARS-CoV-2 target nucleic acids are NOT DETECTED.  The SARS-CoV-2 RNA is generally detectable in upper and lower respiratory specimens during the acute phase of infection. The lowest concentration of SARS-CoV-2 viral copies this assay can detect is 250 copies / mL. Kiyon Fidalgo negative result does not preclude SARS-CoV-2 infection and should not be used as the sole basis for treatment or other patient management decisions.  Nicole Hafley negative result may occur with improper specimen collection / handling, submission of specimen other than nasopharyngeal swab,  presence of viral mutation(s) within the areas targeted by this assay, and inadequate number of viral copies (<250  copies / mL). Taryne Kiger negative result must be combined with clinical observations, patient history, and epidemiological information.  Fact Sheet for Patients:   StrictlyIdeas.no  Fact Sheet for Healthcare Providers: BankingDealers.co.za  This test is not yet approved or  cleared by the [place] FDA and has been authorized for detection and/or diagnosis of SARS-CoV-2 by FDA under an Emergency Use Authorization (EUA).  This EUA will remain in effect (meaning this test can be used) for the duration of the COVID-19 declaration under Section 564(b)(1) of the Act, 21 U.S.C. section 360bbb-3(b)(1), unless the authorization is terminated or revoked sooner.  Performed at [facility], [place] [address] [place], [place] [address]          Radiology Studies: DG Chest 2 View  Result Date: 12/26/2019 CLINICAL DATA:  Shortness of breath. Discharged earlier this morning for pulmonary embolus. No episode of shortness of breath. EXAM: CHEST - 2 VIEW COMPARISON:  Radiograph 4 days ago, chest CT 3 days ago. FINDINGS: No significant change from prior radiographs. Unchanged heart size and mediastinal contours. Aortic atherosclerosis. No developing airspace disease. No large pleural effusion. No pneumothorax. Stable osseous structures. IMPRESSION: No acute radiographic findings. Electronically Signed   By: Keith Rake M.D.   On: 12/26/2019 21:18   CT ANGIO CHEST PE W OR WO CONTRAST  Result Date: 12/27/2019 CLINICAL DATA:  Recent massive PE, return of symptoms today EXAM: CT ANGIOGRAPHY CHEST WITH CONTRAST TECHNIQUE: Multidetector CT imaging of the chest was performed using the standard protocol during bolus administration of intravenous contrast. Multiplanar CT image reconstructions and MIPs were obtained to evaluate the vascular anatomy.  CONTRAST:  135mL OMNIPAQUE IOHEXOL 350 MG/ML SOLN COMPARISON:  CT 12/23/2019, radiograph 12/26/2019 FINDINGS: Cardiovascular: Satisfactory opacification the pulmonary arteries. There are large bilateral pulmonary arteries extending from the distal right and left main pulmonary arteries into the lobar and segmental branches throughout both lungs. The overall degree of embolic burden is similar to the comparison CT from 12/23/2019. Mild central pulmonary artery enlargement and flattening of the intraventricular septum is similar to the comparison. The degree of right ventricular dilatation is perhaps mildly improved with persistent elevation of the RV/LV ratio (1.4) to suggest residual right heart strain. Coronary artery calcifications are present. No pericardial effusion. Atherosclerotic plaque within the normal caliber aorta. No acute luminal abnormality nor periaortic stranding or hemorrhage. Normal 3 vessel branching of the aortic arch. Proximal great vessels mildly tortuous but otherwise unremarkable with Monnica Saltsman medialized course of the common carotid arteries. Major venous structures are free of acute abnormality. Mediastinum/Nodes: No mediastinal fluid or gas. Normal thyroid gland and thoracic inlet. No acute abnormality of the trachea or esophagus. No worrisome mediastinal, hilar or axillary adenopathy. Lungs/Pleura: Low lung volumes basilar atelectatic changes and additional areas of bandlike opacity likely reflecting further subsegmental atelectasis and/or scarring. More focal subpleural opacity in the lingula is increasingly confluent from the prior exam and given the distribution of the pulmonary emboli, could reflect developing infarcts. An additional similar area of subpleural consolidated lung is present in the posterior segment right upper lobe. No pneumothorax or effusion. No concerning pulmonary nodules or masses. Upper Abdomen: Gallbladder calcifications seen anti dependently could reflect mural  calcification or adherent gallstones with Eschol Auxier larger more convincing gallstone towards the gallbladder neck. Prominent fold in the gallbladder neck as well. No biliary ductal dilatation or calcified intraductal gallstones. No acute abnormalities present in the visualized portions of the upper abdomen. Musculoskeletal: Exaggerated thoracic kyphosis with multilevel discogenic and facet  degenerative changes including close is several midthoracic and lower thoracic vertebrae. No acute osseous abnormality or suspicious osseous lesion. Stable appearance of Jaedan Schuman subcutaneous cystic nodule along the right posterior chest wall measuring up to 1.5 cm, contiguous with the dermis, likely reflecting Devon Kingdon small sebaceous/dermal inclusion cyst. No concerning chest wall lesions. Mild body wall edema. Review of the MIP images confirms the above findings. IMPRESSION: 1. Large bilateral pulmonary arteries extending from the distal right and left main pulmonary arteries into the lobar and segmental branches throughout both lungs. The overall degree of embolic burden is similar to the comparison CT from 12/23/2019. Burtis Junes most of the embolic burden to be related to the initial insult though some acute on subacute clot is not fully excluded. 2. The degree of right ventricular dilatation is perhaps mildly improved with persistent elevation of the RV/LV ratio (1.4) suggestive of residual right heart strain. 3. Increasingly confluent subpleural opacity in the peripheral lingula and posterior segment right upper lobe, given distribution of pulmonary emboli, could reflect developing pulmonary infarcts. Infection less likely. Additional atelectatic changes throughout the lungs. 4. Cholelithiasis without evidence of acute cholecystitis. Additional mural calcifications versus adherent gallstones in the more distal gallbladder. Could consider further characterization with nonemergent right upper quadrant ultrasound. 5. Aortic Atherosclerosis  (ICD10-I70.0). These results were called by telephone at the time of interpretation on 12/27/2019 at 2:21 am to provider Dr. Leonides Schanz, who verbally acknowledged these results. Electronically Signed   By: Lovena Le M.D.   On: 12/27/2019 02:22        Scheduled Meds: . chlorthalidone  12.5 mg Oral Daily  . cholecalciferol  1,000 Units Oral Daily  . escitalopram  10 mg Oral Daily  . insulin aspart  0-5 Units Subcutaneous QHS  . insulin aspart  0-9 Units Subcutaneous TID WC  . pantoprazole  40 mg Oral Daily  . rosuvastatin  20 mg Oral QHS  . cyanocobalamin  100 mcg Oral Daily   Continuous Infusions: . heparin 1,300 Units/hr (12/28/19 0800)     LOS: 1 day    Time spent: over 30 min    Fayrene Helper, MD [facility]   To contact the attending provider between 7A-7P or the covering provider during after hours 7P-7A, please log into the web site [email or web address] and access using universal [place] password for that web site. If you do not have the password, please call the hospital operator.  12/28/2019, 2:19 PM

## 2019-12-28 NOTE — Progress Notes (Signed)
[place] for Heparin Indication: pulmonary embolus  Allergies  Allergen Reactions  . Predicort [Prednisolone] Other (See Comments)    Per daughter "makes her crazy"  . Penicillins Rash and Other (See Comments)    Childhood allergy, pt believes she broke out in her mouth    Patient Measurements: Height: 5\' 3"  (160 cm) Weight: 93.5 kg (206 lb 2.1 oz) IBW/kg (Calculated) : 52.4 Heparin Dosing Weight: 75 kg  Vital Signs: Temp: 98.3 F (36.8 C) (07/04 1200) Temp Source: Oral (07/04 1200) BP: 140/63 (07/04 1200) Pulse Rate: 85 (07/04 0800)  Labs: Recent Labs    12/26/19 2137 12/26/19 2137 12/26/19 2337 12/27/19 0400 12/27/19 0418 12/27/19 1120 12/27/19 1901 12/28/19 0305 12/28/19 1333  HGB 12.1   < >  --   --  11.2*  --   --  10.6*  --   HCT 37.0  --   --   --  35.0*  --   --  31.9*  --   PLT 254  --   --   --  213  --   --  242  --   APTT  --   --   --   --  35   < > 75* 48* 124*  LABPROT  --   --   --   --  17.1*  --   --   --   --   INR  --   --   --   --  1.5*  --   --   --   --   HEPARINUNFRC  --   --   --  >2.20*  --   --   --  1.20*  --   CREATININE 1.12*  --   --   --  0.97  --   --  0.87  --   TROPONINIHS 28*  --  27*  --   --   --   --   --   --    < > = values in this interval not displayed.    Estimated Creatinine Clearance: 58.8 mL/min (by C-G formula based on SCr of 0.87 mg/dL).  Assessment: 79 y.o. female with recent PE and was discharged on Eliquis on 12/26/19.  Patient returned with chest pain and SOB.  Pharmacy consulted to transition to IV heparin.  Currently using aPTT to guide heparin dosing.  APTT is supratherapeutic at 124, on 1300 units/hr - drawn correctly. Hgb 10.6, plt 242. No s/sx of bleeding or infusion issues. Plan to transition back to apixaban - CT on 7/3 showing similar embolic burden from previous scan, acute on chronic cannot be excluded >> discussed with MD and okay to restart loading  dose.  Goal of Therapy:  APTT 66-102 sec Heparin level 0.3-0.7 units/ml Monitor platelets by anticoagulation protocol: Yes   Plan:  Discontinue heparin infusion  Will start apixaban 10 mg BID x7 days then reduce to 5 mg BID Daily heparin level, aPTT and CBC Monitor for signs/symptoms of bleeding   Antonietta Jewel, PharmD, [place] Pharmacist  Phone: [phone] 12/28/2019 2:42 PM  Please check AMION for all [place] phone numbers After 10:00 PM, call [place] [phone]

## 2019-12-28 NOTE — Progress Notes (Signed)
[place] for Heparin Indication: pulmonary embolus  Allergies  Allergen Reactions  . Predicort [Prednisolone] Other (See Comments)    Per daughter "makes her crazy"  . Penicillins Rash and Other (See Comments)    Childhood allergy, pt believes she broke out in her mouth    Patient Measurements: Height: 5\' 3"  (160 cm) Weight: 93.5 kg (206 lb 2.1 oz) IBW/kg (Calculated) : 52.4 Heparin Dosing Weight: 75 kg  Vital Signs: Temp: 98 F (36.7 C) (07/04 0335) Temp Source: Oral (07/04 0335) BP: 115/58 (07/04 0335) Pulse Rate: 77 (07/04 0335)  Labs: Recent Labs    12/26/19 2137 12/26/19 2137 12/26/19 2337 12/27/19 0400 12/27/19 0418 12/27/19 0418 12/27/19 1120 12/27/19 1901 12/28/19 0305  HGB 12.1   < >  --   --  11.2*  --   --   --  10.6*  HCT 37.0  --   --   --  35.0*  --   --   --  31.9*  PLT 254  --   --   --  213  --   --   --  242  APTT  --   --   --   --  35   < > 32 75* 48*  LABPROT  --   --   --   --  17.1*  --   --   --   --   INR  --   --   --   --  1.5*  --   --   --   --   HEPARINUNFRC  --   --   --  >2.20*  --   --   --   --  1.20*  CREATININE 1.12*  --   --   --  0.97  --   --   --  0.87  TROPONINIHS 28*  --  27*  --   --   --   --   --   --    < > = values in this interval not displayed.    Estimated Creatinine Clearance: 58.8 mL/min (by C-G formula based on SCr of 0.87 mg/dL).   Assessment: 79 y.o. female with recent PE, Eliquis on hold, for heparin.  Goal of Therapy:  APTT 66-102 sec Heparin level 0.3-0.7 units/ml Monitor platelets by anticoagulation protocol: Yes   Plan:  Increase Heparin  1300 units/hr APTT in 8 hours  Cassity Christian, Bronson Curb 12/28/2019,6:16 AM

## 2019-12-28 NOTE — Plan of Care (Signed)
  Problem: Education: Goal: Knowledge of General Education information will improve Description: Including pain rating scale, medication(s)/side effects and non-pharmacologic comfort measures Outcome: Progressing   Problem: Health Behavior/Discharge Planning: Goal: Ability to manage health-related needs will improve Outcome: Progressing   Problem: Clinical Measurements: Goal: Ability to maintain clinical measurements within normal limits will improve Outcome: Progressing   Problem: Clinical Measurements: Goal: Respiratory complications will improve Outcome: Progressing   Problem: Activity: Goal: Risk for activity intolerance will decrease Outcome: Progressing   Problem: Nutrition: Goal: Adequate nutrition will be maintained Outcome: Progressing   Problem: Coping: Goal: Level of anxiety will decrease Outcome: Progressing   Problem: Pain Managment: Goal: General experience of comfort will improve Outcome: Progressing   Problem: Skin Integrity: Goal: Risk for impaired skin integrity will decrease Outcome: Progressing

## 2019-12-29 LAB — GLUCOSE, CAPILLARY
Glucose-Capillary: 168 mg/dL — ABNORMAL HIGH (ref 70–99)
Glucose-Capillary: 158 mg/dL — ABNORMAL HIGH (ref 70–99)
Glucose-Capillary: 163 mg/dL — ABNORMAL HIGH (ref 70–99)
Glucose-Capillary: 165 mg/dL — ABNORMAL HIGH (ref 70–99)

## 2019-12-29 LAB — COMPREHENSIVE METABOLIC PANEL
ALT: 14 U/L (ref 0–44)
AST: 17 U/L (ref 15–41)
Albumin: 2.8 g/dL — ABNORMAL LOW (ref 3.5–5.0)
Alkaline Phosphatase: 51 U/L (ref 38–126)
Anion gap: 8 (ref 5–15)
BUN: 13 mg/dL (ref 8–23)
CO2: 24 mmol/L (ref 22–32)
Calcium: 9.3 mg/dL (ref 8.9–10.3)
Chloride: 105 mmol/L (ref 98–111)
Creatinine, Ser: 1.06 mg/dL — ABNORMAL HIGH (ref 0.44–1.00)
GFR calc Af Amer: 59 mL/min — ABNORMAL LOW (ref >60)
GFR calc non Af Amer: 51 mL/min — ABNORMAL LOW (ref >60)
Glucose, Bld: 175 mg/dL — ABNORMAL HIGH (ref 70–99)
Potassium: 3.6 mmol/L (ref 3.5–5.1)
Sodium: 137 mmol/L (ref 135–145)
Total Bilirubin: 0.9 mg/dL (ref 0.3–1.2)
Total Protein: 6 g/dL — ABNORMAL LOW (ref 6.5–8.1)

## 2019-12-29 LAB — TYPE AND SCREEN
ABO/RH(D): O NEG
Antibody Screen: NEGATIVE

## 2019-12-29 LAB — CBC
HCT: 31.6 % — ABNORMAL LOW (ref 36.0–46.0)
Hemoglobin: 10.5 g/dL — ABNORMAL LOW (ref 12.0–15.0)
MCH: 29 pg (ref 26.0–34.0)
MCHC: 33.2 g/dL (ref 30.0–36.0)
MCV: 87.3 fL (ref 80.0–100.0)
Platelets: 248 10*3/uL (ref 150–400)
RBC: 3.62 MIL/uL — ABNORMAL LOW (ref 3.87–5.11)
RDW: 13.8 % (ref 11.5–15.5)
WBC: 8.1 10*3/uL (ref 4.0–10.5)
nRBC: 0 % (ref 0.0–0.2)

## 2019-12-29 LAB — HEMOGLOBIN AND HEMATOCRIT, BLOOD
HCT: 32.7 % — ABNORMAL LOW (ref 36.0–46.0)
Hemoglobin: 10.7 g/dL — ABNORMAL LOW (ref 12.0–15.0)

## 2019-12-29 LAB — MAGNESIUM: Magnesium: 1.5 mg/dL — ABNORMAL LOW (ref 1.7–2.4)

## 2019-12-29 LAB — ABO/RH: ABO/RH(D): O NEG

## 2019-12-29 LAB — PHOSPHORUS: Phosphorus: 3 mg/dL (ref 2.5–4.6)

## 2019-12-29 MED ORDER — MAGNESIUM SULFATE 2 GM/50ML IV SOLN
2.0000 g | Freq: Once | INTRAVENOUS | Status: AC
  Administered 2019-12-29: 2 g via INTRAVENOUS
  Filled 2019-12-29: qty 50

## 2019-12-29 NOTE — Plan of Care (Signed)
  Problem: Education: Goal: Knowledge of General Education information will improve Description: Including pain rating scale, medication(s)/side effects and non-pharmacologic comfort measures Outcome: Progressing   Problem: Health Behavior/Discharge Planning: Goal: Ability to manage health-related needs will improve Outcome: Progressing   Problem: Clinical Measurements: Goal: Ability to maintain clinical measurements within normal limits will improve Outcome: Progressing   Problem: Clinical Measurements: Goal: Respiratory complications will improve Outcome: Progressing   Problem: Activity: Goal: Risk for activity intolerance will decrease Outcome: Progressing   Problem: Nutrition: Goal: Adequate nutrition will be maintained Outcome: Progressing   Problem: Coping: Goal: Level of anxiety will decrease Outcome: Progressing   Problem: Elimination: Goal: Will not experience complications related to bowel motility Outcome: Progressing   Problem: Pain Managment: Goal: General experience of comfort will improve Outcome: Progressing   Problem: Skin Integrity: Goal: Risk for impaired skin integrity will decrease Outcome: Progressing

## 2019-12-29 NOTE — Progress Notes (Addendum)
PROGRESS NOTE    Dawn Peterson  TIW:[id number] DOB: [birth year] DOA: 12/26/2019 PCP: [facility]   Chief Complaint  Patient presents with  . Shortness of Breath    Brief Narrative:  Dawn Peterson is Dawn Peterson 79 y.o. female with medical history significant for type II DM, hypertension who presents to the emergency department due to shortness of breath.  Patient was discharged yesterday (12/26/2019 after being treated for pulmonary embolism.  On discharge, patient was stable and was in no distress.  Shortly after she arrived at home, she developed worsening shortness of breath which worsens on exertion and this progressively got worse over the next few hours, EMS was activated and patient was taken to the ED for further evaluation and management.  ED Course:  In the emergency department, he was immediately tachycardic, however when patient ambulated several feet, O2 sats quickly dropped to 89%, she became tachypneic and tachycardic.  Work-up in the ED showed troponin 28> 27 (this was around 322-4 days ago; 540-5 days ago).  Hypoglycemia was also noted.  CT angio chest PE with or without contrast was consistent with clot burden but radiologist was unable to tell if there was Dawn Peterson new thrombus.  Development of lingular infarct was also suspected.  Patient was started on IV heparin drip and hospitalist was asked to admit.  For further evaluation and management.  Assessment & Plan:   Principal Problem:   Hypoxemic respiratory failure, chronic (HCC) Active Problems:   Pulmonary embolism (HCC)   Essential hypertension   Type 2 diabetes mellitus without complication (HCC)   Hyperglycemia due to type 2 diabetes mellitus ([place])   Hyperlipidemia  Blood in stool: stool this morning with some blood noted in stool.  Mostly normal brown stool, but small clot noted.  Will continue to monitor on anticoagulation.  Low threshold for GI c/s with need for anticoagulation.  FOBT Trend Hb/Hct ->  stable  Acute Hypoxemic Respiratory Failure 2/2 Pulmonary Embolism  Extensive Right Lower Extremity DVT:  Treated with tpa during last admission with hemodynamic instability Repeat CT scan here with similar degree of clot burden, though some acute on subacute clot not excluded - mildly improved RV dilatation - developing pulmonary infarcts  Eliquis C/o persistent SOB today, on oxygen this morning - wean as tolerated Home o2 screen prior to discharge Pulm c/s, recommending anticoagulation, rather than repeating thrombolytics or catheter directed thrombectomy - consider reconsult if clinical trajectory worsens Compression stocking, elevation   Hyperglycemia secondary to type II days mellitus Continue/sliding scale and hypoglycemia protocol Hold home meds  Essential hypertension Chlorthalidone  Hyperlipidemia Crestor  CT scan with cholelithiasis and additional mural calcifications vs adherent gallstones: needs RUQ [place] outpatient  DVT prophylaxis: heparin gtt Code Status: full  Family Communication: none at bedside Disposition:   Status is: inpatient  The patient will require care spanning > 2 midnights and should be moved to inpatient because: Inpatient level of care appropriate due to severity of illness  Dispo: The patient is from: Home              Anticipated d/c is to: Home              Anticipated d/c date is: 1 day              Patient currently is not medically stable to d/c.  Consultants:   Pulm  Procedures:  none  Antimicrobials:  Anti-infectives (From admission, onward)   None  Subjective: Worsening SOB today  Objective: Vitals:   12/28/19 1955 12/28/19 2355 12/29/19 0349 12/29/19 0710  BP: (!) 142/58 (!) 149/72 (!) 128/59 (!) 147/66  Pulse: 73 79 82 81  Resp: 20 19 16 17   Temp: 98.5 F (36.9 C) 98.5 F (36.9 C) 98.1 F (36.7 C) 98.1 F (36.7 C)  TempSrc: Oral Oral Oral Oral  SpO2: 95% 93% 93% 93%  Weight:      Height:         Intake/Output Summary (Last 24 hours) at 12/29/2019 1028 Last data filed at 12/28/2019 2355 Gross per 24 hour  Intake 480 ml  Output --  Net 480 ml   Filed Weights   12/26/19 2045 12/27/19 0600  Weight: 94.2 kg 93.5 kg    Examination:  General: No acute distress. Cardiovascular: Heart sounds show Dawn Peterson regular rate, and rhythm.  Lungs: Clear to auscultation bilaterally  Abdomen: Soft, nontender, nondistended Stool noted in bedside commode, mostly normal brown stool with some red flecks  Neurological: Alert and oriented 3. Moves all extremities 4 . Cranial nerves II through XII grossly intact. Skin: Warm and dry. No rashes or lesions. Extremities: No clubbing or cyanosis. No edema.    Data Reviewed: I have personally reviewed following labs and imaging studies  CBC: Recent Labs  Lab 12/22/19 2329 12/23/19 0530 12/26/19 0509 12/26/19 2137 12/27/19 0418 12/28/19 0305 12/29/19 0331  WBC 15.9*   < > 8.8 11.1* 10.7* 10.1 8.1  NEUTROABS 11.1*  --   --   --   --   --   --   HGB 14.5   < > 10.0* 12.1 11.2* 10.6* 10.5*  HCT 44.4   < > 30.9* 37.0 35.0* 31.9* 31.6*  MCV 88.8   < > 86.8 88.5 89.3 87.6 87.3  PLT 297   < > 183 254 213 242 248   < > = values in this interval not displayed.    Basic Metabolic Panel: Recent Labs  Lab 12/26/19 0509 12/26/19 2137 12/27/19 0418 12/28/19 0305 12/29/19 0331  NA 137 137 136 138 137  K 3.8 3.8 3.8 3.7 3.6  CL 109 107 108 108 105  CO2 20* 21* 19* 23 24  GLUCOSE 168* 241* 199* 155* 175*  BUN 12 11 11 10 13   CREATININE 1.00 1.12* 0.97 0.87 1.06*  CALCIUM 8.9 9.2 9.0 9.1 9.3  MG  --   --  1.6* 1.7 1.5*  PHOS  --   --  2.8 3.5 3.0    GFR: Estimated Creatinine Clearance: 48.3 mL/min ([place]) (by C-G formula based on SCr of 1.06 mg/dL (H)).  Liver Function Tests: Recent Labs  Lab 12/22/19 2329 12/26/19 2137 12/27/19 0418 12/28/19 0305 12/29/19 0331  AST 23 23 20 17 17   ALT 16 18 18 16 14   ALKPHOS 63 60 51 49 51  BILITOT 0.6  0.7 0.7 0.7 0.9  PROT 7.5 6.2* 6.1* 5.9* 6.0*  ALBUMIN 3.3* 2.9* 2.9* 2.7* 2.8*    CBG: Recent Labs  Lab 12/28/19 0607 12/28/19 1119 12/28/19 1618 12/28/19 2129 12/29/19 0620  GLUCAP 148* 157* 185* 166* 168*     Recent Results (from the past 240 hour(s))  SARS Coronavirus 2 by RT PCR (hospital order, performed in [place] hospital lab) Nasopharyngeal Nasopharyngeal Swab     Status: None   Collection Time: 12/22/19 11:29 PM   Specimen: Nasopharyngeal Swab  Result Value Ref Range Status   SARS Coronavirus 2 NEGATIVE NEGATIVE Final    Comment: (  NOTE) SARS-CoV-2 target nucleic acids are NOT DETECTED.  The SARS-CoV-2 RNA is generally detectable in upper and lower respiratory specimens during the acute phase of infection. The lowest concentration of SARS-CoV-2 viral copies this assay can detect is 250 copies / mL. Lauretta Sallas negative result does not preclude SARS-CoV-2 infection and should not be used as the sole basis for treatment or other patient management decisions.  Sitlaly Gudiel negative result may occur with improper specimen collection / handling, submission of specimen other than nasopharyngeal swab, presence of viral mutation(s) within the areas targeted by this assay, and inadequate number of viral copies (<250 copies / mL). Tyson Masin negative result must be combined with clinical observations, patient history, and epidemiological information.  Fact Sheet for Patients:   StrictlyIdeas.no  Fact Sheet for Healthcare Providers: BankingDealers.co.za  This test is not yet approved or  cleared by the [place] FDA and has been authorized for detection and/or diagnosis of SARS-CoV-2 by FDA under an Emergency Use Authorization (EUA).  This EUA will remain in effect (meaning this test can be used) for the duration of the COVID-19 declaration under Section 564(b)(1) of the Act, 21 U.S.C. section 360bbb-3(b)(1), unless the authorization is  terminated or revoked sooner.  Performed at [facility], [place] [address] [place], [place] [address]   MRSA PCR Screening     Status: None   Collection Time: 12/23/19  5:16 AM   Specimen: Nasal Mucosa; Nasopharyngeal  Result Value Ref Range Status   MRSA by PCR NEGATIVE NEGATIVE Final    Comment:        The GeneXpert MRSA Assay (FDA approved for NASAL specimens only), is one component of Andera Cranmer comprehensive MRSA colonization surveillance program. It is not intended to diagnose MRSA infection nor to guide or monitor treatment for MRSA infections. Performed at [facility], [place] [address], [place], [place] [address]   Blood Culture (routine x 2)     Status: None   Collection Time: 12/23/19  7:19 AM   Specimen: BLOOD RIGHT HAND  Result Value Ref Range Status   Specimen Description BLOOD RIGHT HAND  Final   Special Requests   Final    BOTTLES DRAWN AEROBIC ONLY Blood Culture adequate volume   Culture   Final    NO GROWTH 5 DAYS Performed at [facility], [place] [address] [place], [place] [address]    Report Status 12/28/2019 FINAL  Final  Blood Culture (routine x 2)     Status: None   Collection Time: 12/23/19  7:25 AM   Specimen: BLOOD LEFT HAND  Result Value Ref Range Status   Specimen Description BLOOD LEFT HAND  Final   Special Requests   Final    BOTTLES DRAWN AEROBIC ONLY Blood Culture results may not be optimal due to an inadequate volume of blood received in culture bottles   Culture   Final    NO GROWTH 5 DAYS Performed at [facility], [place] [address] [place], [place] [address]    Report Status 12/28/2019 FINAL  Final  SARS Coronavirus 2 by RT PCR (hospital order, performed in [facility] hospital lab) Nasopharyngeal Nasopharyngeal Swab     Status: None   Collection Time: 12/27/19  3:05 AM   Specimen: Nasopharyngeal Swab  Result Value Ref Range Status   SARS Coronavirus 2 NEGATIVE NEGATIVE Final    Comment: (NOTE) SARS-CoV-2 target nucleic  acids are NOT DETECTED.  The SARS-CoV-2 RNA is generally detectable in upper and lower  respiratory specimens during the acute phase of infection. The lowest concentration of SARS-CoV-2 viral copies this assay can detect is 250 copies / mL. Theone Bowell negative result does not preclude SARS-CoV-2 infection and should not be used as the sole basis for treatment or other patient management decisions.  Clytie Shetley negative result may occur with improper specimen collection / handling, submission of specimen other than nasopharyngeal swab, presence of viral mutation(s) within the areas targeted by this assay, and inadequate number of viral copies (<250 copies / mL). Aniceto Kyser negative result must be combined with clinical observations, patient history, and epidemiological information.  Fact Sheet for Patients:   StrictlyIdeas.no  Fact Sheet for Healthcare Providers: BankingDealers.co.za  This test is not yet approved or  cleared by the [place] FDA and has been authorized for detection and/or diagnosis of SARS-CoV-2 by FDA under an Emergency Use Authorization (EUA).  This EUA will remain in effect (meaning this test can be used) for the duration of the COVID-19 declaration under Section 564(b)(1) of the Act, 21 U.S.C. section 360bbb-3(b)(1), unless the authorization is terminated or revoked sooner.  Performed at [facility], [place] [address] [place], [place] [address]          Radiology Studies: No results found.      Scheduled Meds: . apixaban  10 mg Oral BID   Followed by  . [START ON 01/04/2020] apixaban  5 mg Oral BID  . chlorthalidone  12.5 mg Oral Daily  . cholecalciferol  1,000 Units Oral Daily  . escitalopram  10 mg Oral Daily  . insulin aspart  0-5 Units Subcutaneous QHS  . insulin aspart  0-9 Units Subcutaneous TID WC  . pantoprazole  40 mg Oral Daily  . rosuvastatin  20 mg Oral QHS  . cyanocobalamin  100 mcg Oral Daily    Continuous Infusions:    LOS: 2 days    Time spent: over 30 min    Fayrene Helper, MD [facility]   To contact the attending provider between 7A-7P or the covering provider during after hours 7P-7A, please log into the web site [email or web address] and access using universal [place] password for that web site. If you do not have the password, please call the hospital operator.  12/29/2019, 10:28 AM

## 2019-12-29 NOTE — Progress Notes (Signed)
SATURATION QUALIFICATIONS: (This note is used to comply with regulatory documentation for home oxygen)  Patient Saturations on Room Air at Rest = 94%  Patient Saturations on Room Air while Ambulating = 86%  Patient Saturations on 2 Liters of oxygen while Ambulating = 92%  Please briefly explain why patient needs home oxygen: desaturations and feels SOB during ambulation.

## 2019-12-30 ENCOUNTER — Telehealth: Payer: Self-pay | Admitting: Internal Medicine

## 2019-12-30 DIAGNOSIS — I2781 Cor pulmonale (chronic): Secondary | ICD-10-CM

## 2019-12-30 LAB — COMPREHENSIVE METABOLIC PANEL
ALT: 15 U/L (ref 0–44)
AST: 21 U/L (ref 15–41)
Albumin: 2.7 g/dL — ABNORMAL LOW (ref 3.5–5.0)
Alkaline Phosphatase: 44 U/L (ref 38–126)
Anion gap: 9 (ref 5–15)
BUN: 16 mg/dL (ref 8–23)
CO2: 26 mmol/L (ref 22–32)
Calcium: 9.1 mg/dL (ref 8.9–10.3)
Chloride: 102 mmol/L (ref 98–111)
Creatinine, Ser: 1.1 mg/dL — ABNORMAL HIGH (ref 0.44–1.00)
GFR calc Af Amer: 56 mL/min — ABNORMAL LOW (ref >60)
GFR calc non Af Amer: 48 mL/min — ABNORMAL LOW (ref >60)
Glucose, Bld: 172 mg/dL — ABNORMAL HIGH (ref 70–99)
Potassium: 3.8 mmol/L (ref 3.5–5.1)
Sodium: 137 mmol/L (ref 135–145)
Total Bilirubin: 0.9 mg/dL (ref 0.3–1.2)
Total Protein: 5.7 g/dL — ABNORMAL LOW (ref 6.5–8.1)

## 2019-12-30 LAB — CBC
HCT: 30.9 % — ABNORMAL LOW (ref 36.0–46.0)
Hemoglobin: 10 g/dL — ABNORMAL LOW (ref 12.0–15.0)
MCH: 28.3 pg (ref 26.0–34.0)
MCHC: 32.4 g/dL (ref 30.0–36.0)
MCV: 87.5 fL (ref 80.0–100.0)
Platelets: 261 10*3/uL (ref 150–400)
RBC: 3.53 MIL/uL — ABNORMAL LOW (ref 3.87–5.11)
RDW: 13.8 % (ref 11.5–15.5)
WBC: 8 10*3/uL (ref 4.0–10.5)
nRBC: 0 % (ref 0.0–0.2)

## 2019-12-30 LAB — GLUCOSE, CAPILLARY
Glucose-Capillary: 214 mg/dL — ABNORMAL HIGH (ref 70–99)
Glucose-Capillary: 166 mg/dL — ABNORMAL HIGH (ref 70–99)

## 2019-12-30 LAB — MAGNESIUM: Magnesium: 1.9 mg/dL (ref 1.7–2.4)

## 2019-12-30 LAB — PHOSPHORUS: Phosphorus: 3.5 mg/dL (ref 2.5–4.6)

## 2019-12-30 MED ORDER — APIXABAN 5 MG PO TABS: ORAL_TABLET | ORAL | 0 refills | Status: AC

## 2019-12-30 NOTE — TOC Transition Note (Signed)
Transition of Care [facility]) - CM/SW Discharge Note   Patient Details  Name: Dawn Peterson MRN: [id number] Date of Birth: [birth year]  [facility] CM/SW Contact:  Zenon Mayo, RN Phone Number: 12/30/2019, 1:35 PM   Clinical Narrative:    Patient is for dc today, NCM notified [facility],  NCM spoke wwith patient spouse and he states [employer] is ok for the oxyten , NCM made referral to [facility] with [facility] for home oxygen.    Final next level of care: [place] Barriers to Discharge: No Barriers Identified   Patient Goals and CMS Choice Patient states their goals for this hospitalization and ongoing recovery are:: get better      Discharge Placement                       Discharge Plan and Services                DME Arranged: Oxygen DME Agency: [facility] Date DME Agency Contacted: 12/30/19 Time DME Agency Contacted: [phone] Representative spoke with at DME Agency: Thedore Mins HH Arranged: RN, Disease Management, PT, OT, Nurse's Aide [place] Agency: [place] Date [place]: 12/30/19 Time [place]: 3009 Representative spoke with at [place]: rep  Social Determinants of Health ([place]) Interventions     Readmission Risk Interventions No flowsheet data found.

## 2019-12-30 NOTE — Discharge Instructions (Addendum)
Information on my medicine - ELIQUIS (apixaban)  This medication education was reviewed with me or my healthcare representative as part of my discharge preparation.  The pharmacist that spoke with me during my hospital stay was:  Einar Grad, [facility]  Why was Eliquis prescribed for you? Eliquis was prescribed to treat blood clots that may have been found in the veins of your legs (deep vein thrombosis) or in your lungs (pulmonary embolism) and to reduce the risk of them occurring again.  What do You need to know about Eliquis ? The starting dose is 10 mg (two 5 mg tablets) taken TWICE daily for the FIRST SEVEN (7) DAYS, then on (enter date)  7/11  the dose is reduced to ONE 5 mg tablet taken TWICE daily.  Eliquis may be taken with or without food.   Try to take the dose about the same time in the morning and in the evening. If you have difficulty swallowing the tablet whole please discuss with your pharmacist how to take the medication safely.  Take Eliquis exactly as prescribed and DO NOT stop taking Eliquis without talking to the doctor who prescribed the medication.  Stopping may increase your risk of developing Cletis Clack new blood clot.  Refill your prescription before you run out.  After discharge, you should have regular check-up appointments with your healthcare provider that is prescribing your Eliquis.    What do you do if you miss Abbas Beyene dose? If Sister Carbone dose of ELIQUIS is not taken at the scheduled time, take it as soon as possible on the same day and twice-daily administration should be resumed. The dose should not be doubled to make up for Jalysa Swopes missed dose.  Important Safety Information Rani Sisney possible side effect of Eliquis is bleeding. You should call your healthcare provider right away if you experience any of the following: ? Bleeding from an injury or your nose that does not stop. ? Unusual colored urine (red or dark brown) or unusual colored stools (red or black). ? Unusual bruising for  unknown reasons. ? Merion Caton serious fall or if you hit your head (even if there is no bleeding).  Some medicines may interact with Eliquis and might increase your risk of bleeding or clotting while on Eliquis. To help avoid this, consult your healthcare provider or pharmacist prior to using any new prescription or non-prescription medications, including herbals, vitamins, non-steroidal anti-inflammatory drugs (NSAIDs) and supplements.  This website has more information on Eliquis (apixaban): http://www.eliquis.com/eliquis/home

## 2019-12-30 NOTE — Telephone Encounter (Signed)
Set up repeat echo in September, will call daughter with results and whether or not f/u is needed.  Erskine Emery MD PCCM

## 2019-12-30 NOTE — Discharge Summary (Addendum)
Physician Discharge Summary  RONEISHA STERN QBH:[id number] DOB: [birth year] DOA: 12/26/2019  PCP: [facility] [place] date: 12/26/2019 Discharge date: 12/30/2019  Time spent: 40 minutes  Recommendations for Outpatient Follow-up:  1. Follow up outpatient CBC/CMP 2. Continue eliquis, uninterrupted 3-6 months - would recommend considering heme f/u in setting of what seems like unprovoked submassive PE for additional w/u - I think indefinite anticoagulation would be reasonable for her with her unprovoked PE and submassive PE 3. Ensure up to date with routine cancer screening in setting of PE/DVT 4. Scant blood noted in stool 7/5, 7/6 appeared resolved - given return precautions - follow up with GI outpatient  5. Follow up RUQ [place] outpatient  6. Follow echo outpatient with pulmonary 7. Compression stocking to RLE  Discharge Diagnoses:  Principal Problem:   Hypoxemic respiratory failure, chronic (HCC) Active Problems:   Pulmonary embolism (HCC)   Essential hypertension   Type 2 diabetes mellitus without complication (HCC)   Hyperglycemia due to type 2 diabetes mellitus ([place])   Hyperlipidemia   Discharge Condition: stable  Diet recommendation: diabetic/heart healthy  Filed Weights   12/26/19 2045 12/27/19 0600  Weight: 94.2 kg 93.5 kg    History of present illness:  Sparrow L Johnsonis Aneesa Romey 79 y.o.femalewith medical history significant fortype II DM, hypertension who presents to the emergency department due to shortness of breath. Patient was discharged yesterday (12/26/2019 after being treated for pulmonary embolism. On discharge, patient was stable and was in no distress. Shortly after she arrived at home, she developed worsening shortness of breath which worsenson exertion and thisprogressively got worse over the next few hours, EMS was activated and patient was taken to the ED for further evaluation and management.  ED Course: In the emergency department, he was  immediately tachycardic, however when patient ambulated several feet, O2 sats quickly dropped to 89%, she became tachypneic and tachycardic. Work-up in the ED showed troponin 28>27 (this was around 322-4 days ago;540-5 days ago). Hypoglycemia was also noted. CT angio chest PE with or without contrast was consistent with clot burden but radiologist was unable to tell if there was Hermione Havlicek new thrombus. Development of lingular infarct was also suspected. Patient was started on IV heparin drip and hospitalist was asked to admit. For further evaluation and management.  She was admitted for shortness of breath in the setting of recently being discharged for PE s/p treatment with TPA.  Imaging was notable for similar embolic burden, residual right heart strain, developing pulmonary infarcts.  Pulm was c/s and recommended continued anticoagulation.  She qualified for home o2 and was discharged in stable condition on 7/6.   See below for additional details  Hospital Course:  Blood in stool: stool with scant blood on 7/5.  Appeared resolved on 7/6.  Will continue to monitor on anticoagulation.  Low threshold for GI c/s with need for anticoagulation.  Follow with GI outpatient, discussed with [facility] to arrange follow up  Acute Hypoxemic Respiratory Failure 2/2 Pulmonary Embolism  Extensive Right Lower Extremity DVT:  Sounds unprovoked, did have recent travel, but plane trip was only 1 hr Treated with tpa during last admission with hemodynamic instability Repeat CT scan here with similar degree of clot burden, though some acute on subacute clot not excluded - mildly improved RV dilatation - developing pulmonary infarcts  Eliquis C/o persistent SOB today, on oxygen this morning - wean as tolerated Home o2 screen prior to discharge -> requiring 2 L with activity  Pulm  c/s, recommending anticoagulation, rather than repeating thrombolytics or catheter directed thrombectomy - consider reconsult if clinical  trajectory worsens Compression stocking, elevation  She'll need echo as an outpatient with potential pulm follow up based on results  Hyperglycemia secondary to type II days mellitus Continue/sliding scale and hypoglycemia protocol Hold home meds  Essential hypertension Chlorthalidone  Hyperlipidemia Crestor  CT scan with cholelithiasis and additional mural calcifications vs adherent gallstones: needs RUQ [place] outpatient  Procedures:  none  Consultations:  pulmonology  Discharge Exam: Vitals:   12/30/19 0810 12/30/19 1232  BP: (!) 141/84 128/90  Pulse: 70 62  Resp: 19 20  Temp: 98.3 F (36.8 C) (!) 97.5 F (36.4 C)  SpO2: 96% 99%   Feeling better today, able to get up and about with improved SOB Discussed with daughter, d/c plan  General: No acute distress. Cardiovascular: Heart sounds show Erla Bacchi regular rate, and rhythm. Lungs: Clear to auscultation bilaterally  Abdomen: Soft, nontender, nondistended Neurological: Alert and oriented 3. Moves all extremities 4. Cranial nerves II through XII grossly intact. Skin: Warm and dry. No rashes or lesions. Extremities: No clubbing or cyanosis. No edema. Discharge Instructions   Discharge Instructions    Call MD for:  difficulty breathing, headache or visual disturbances   Complete by: As directed    Call MD for:  extreme fatigue   Complete by: As directed    Call MD for:  hives   Complete by: As directed    Call MD for:  persistant dizziness or light-headedness   Complete by: As directed    Call MD for:  persistant nausea and vomiting   Complete by: As directed    Call MD for:  redness, tenderness, or signs of infection (pain, swelling, redness, odor or green/yellow discharge around incision site)   Complete by: As directed    Call MD for:  severe uncontrolled pain   Complete by: As directed    Call MD for:  temperature >100.4   Complete by: As directed    Diet - low sodium heart healthy   Complete by: As  directed    Discharge instructions   Complete by: As directed    You were seen for Letta Cargile pulmonary embolism.  You've improved and will be discharged with supplemental oxygen.  It's extremely important that you continue the eliquis without interruption.  Continue 5 more days with 10 mg twice daily, then start 5 mg twice daily (on 01/04/2020).  You will need uninterrupted anticoagulation for at least 3-6 months, after this, you should have Georgena Weisheit discussion with your PCP about long term anticoagulation.  It may be reasonable to have hematology follow up to help with this discussion.  You'll have Dmarcus Decicco repeat echo outpatient and pulmonology will follow these results up with you.  We'll discharge you with thigh high compression stockings for your right leg.  Keep this leg elevated when you're seated or in bed.  You had some blood noted in your stool that has resolved.  Please follow up with [facility] as an outpatient.  Return if you have recurrent or worsening bleeding.  Return for new, recurrent, or worsening symptoms.  Please ask your PCP to request records from this hospitalization so they know what was done and what the next steps will be.   Increase activity slowly   Complete by: As directed    No wound care   Complete by: As directed    No wound care   Complete by: As directed  Allergies as of 12/30/2019      Reactions   Predicort [prednisolone] Other (See Comments)   Per daughter "makes her crazy"   Penicillins Rash, Other (See Comments)   Childhood allergy, pt believes she broke out in her mouth      Medication List    TAKE these medications   apixaban 5 MG Tabs tablet Commonly known as: ELIQUIS Please take 10 mg oral twice daily for next 5 days, then transition to 5 mg oral twice daily on 01/04/2020 What changed: additional instructions   chlorthalidone 25 MG tablet Commonly known as: HYGROTON Take 12.5 mg by mouth daily.   cholecalciferol 25 MCG (1000 UNIT) tablet Commonly known  as: VITAMIN D3 Take 1,000 Units by mouth daily.   cyanocobalamin 100 MCG tablet Take 1 tablet (100 mcg total) by mouth daily.   escitalopram 10 MG tablet Commonly known as: LEXAPRO Take 10 mg by mouth daily.   Farxiga 10 MG Tabs tablet Generic drug: dapagliflozin propanediol Take 10 mg by mouth daily.   loperamide 2 MG capsule Commonly known as: IMODIUM Take 2 mg by mouth at bedtime.   pantoprazole 40 MG tablet Commonly known as: Protonix Take 1 tablet (40 mg total) by mouth daily.   rosuvastatin 20 MG tablet Commonly known as: CRESTOR Take 20 mg by mouth at bedtime.            Durable Medical Equipment  (From admission, onward)         Start     Ordered   12/30/19 1154  DME Oxygen  Once       Comments: SATURATION QUALIFICATIONS: (This note is used to comply with regulatory documentation for home oxygen)  Patient Saturations on Room Air at Rest = 94%  Patient Saturations on Room Air while Ambulating = 86%  Patient Saturations on 2 Liters of oxygen while Ambulating = 92%  Please briefly explain why patient needs home oxygen: pt needs 2 L to maintain O2 sats above 88% with ambulation  Question Answer Comment  Length of Need 12 Months   Liters per Minute 2   Frequency Continuous (stationary and portable oxygen unit needed)   Oxygen conserving device Yes   Oxygen delivery system Gas      12/30/19 1155         Allergies  Allergen Reactions  . Predicort [Prednisolone] Other (See Comments)    Per daughter "makes her crazy"  . Penicillins Rash and Other (See Comments)    Childhood allergy, pt believes she broke out in her mouth    Follow-up [facility] [place] Follow up.   Specialty: [place] Why: HHRN,HHPT Contact information: [address] [place] [address] [phone]        [facility], [facility] Family Follow up.   Specialty: Family Medicine Contact information: [place]  [address] [facility] Follow up.   Specialty: Gastroenterology Why: Call for follow up for blood in stool Contact information: [address] [place] [address] [phone]               The results of significant diagnostics from this hospitalization (including imaging, microbiology, ancillary and laboratory) are listed below for reference.    Significant Diagnostic Studies: DG Chest 2 View  Result Date: 12/26/2019 CLINICAL DATA:  Shortness of breath. Discharged earlier this morning for pulmonary embolus. No episode of shortness of breath. EXAM:  CHEST - 2 VIEW COMPARISON:  Radiograph 4 days ago, chest CT 3 days ago. FINDINGS: No significant change from prior radiographs. Unchanged heart size and mediastinal contours. Aortic atherosclerosis. No developing airspace disease. No large pleural effusion. No pneumothorax. Stable osseous structures. IMPRESSION: No acute radiographic findings. Electronically Signed   By: Keith Rake M.D.   On: 12/26/2019 21:18   DG Chest 2 View  Result Date: 12/22/2019 CLINICAL DATA:  Shortness of breath. Symptoms for 2 weeks, increased with exertion. Fatigue. EXAM: CHEST - 2 VIEW COMPARISON:  Radiograph 11/06/2019 FINDINGS: Normal heart size with unchanged mediastinal contours. Mild aortic atherosclerosis. No pulmonary edema. No significant pleural effusion. No focal airspace disease. Stable degenerative change in the spine. IMPRESSION: No acute chest findings. Electronically Signed   By: Keith Rake M.D.   On: 12/22/2019 23:18   CT ANGIO CHEST PE W OR WO CONTRAST  Result Date: 12/27/2019 CLINICAL DATA:  Recent massive PE, return of symptoms today EXAM: CT ANGIOGRAPHY CHEST WITH CONTRAST TECHNIQUE: Multidetector CT imaging of the chest was performed using the standard protocol during bolus administration of intravenous contrast. Multiplanar CT image reconstructions and MIPs were obtained to  evaluate the vascular anatomy. CONTRAST:  137mL OMNIPAQUE IOHEXOL 350 MG/ML SOLN COMPARISON:  CT 12/23/2019, radiograph 12/26/2019 FINDINGS: Cardiovascular: Satisfactory opacification the pulmonary arteries. There are large bilateral pulmonary arteries extending from the distal right and left main pulmonary arteries into the lobar and segmental branches throughout both lungs. The overall degree of embolic burden is similar to the comparison CT from 12/23/2019. Mild central pulmonary artery enlargement and flattening of the intraventricular septum is similar to the comparison. The degree of right ventricular dilatation is perhaps mildly improved with persistent elevation of the RV/LV ratio (1.4) to suggest residual right heart strain. Coronary artery calcifications are present. No pericardial effusion. Atherosclerotic plaque within the normal caliber aorta. No acute luminal abnormality nor periaortic stranding or hemorrhage. Normal 3 vessel branching of the aortic arch. Proximal great vessels mildly tortuous but otherwise unremarkable with Masen Luallen medialized course of the common carotid arteries. Major venous structures are free of acute abnormality. Mediastinum/Nodes: No mediastinal fluid or gas. Normal thyroid gland and thoracic inlet. No acute abnormality of the trachea or esophagus. No worrisome mediastinal, hilar or axillary adenopathy. Lungs/Pleura: Low lung volumes basilar atelectatic changes and additional areas of bandlike opacity likely reflecting further subsegmental atelectasis and/or scarring. More focal subpleural opacity in the lingula is increasingly confluent from the prior exam and given the distribution of the pulmonary emboli, could reflect developing infarcts. An additional similar area of subpleural consolidated lung is present in the posterior segment right upper lobe. No pneumothorax or effusion. No concerning pulmonary nodules or masses. Upper Abdomen: Gallbladder calcifications seen anti  dependently could reflect mural calcification or adherent gallstones with Larwence Tu larger more convincing gallstone towards the gallbladder neck. Prominent fold in the gallbladder neck as well. No biliary ductal dilatation or calcified intraductal gallstones. No acute abnormalities present in the visualized portions of the upper abdomen. Musculoskeletal: Exaggerated thoracic kyphosis with multilevel discogenic and facet degenerative changes including close is several midthoracic and lower thoracic vertebrae. No acute osseous abnormality or suspicious osseous lesion. Stable appearance of Warda Mcqueary subcutaneous cystic nodule along the right posterior chest wall measuring up to 1.5 cm, contiguous with the dermis, likely reflecting Takeria Marquina small sebaceous/dermal inclusion cyst. No concerning chest wall lesions. Mild body wall edema. Review of the MIP images confirms the above findings. IMPRESSION: 1. Large bilateral pulmonary arteries extending from the distal  right and left main pulmonary arteries into the lobar and segmental branches throughout both lungs. The overall degree of embolic burden is similar to the comparison CT from 12/23/2019. Burtis Junes most of the embolic burden to be related to the initial insult though some acute on subacute clot is not fully excluded. 2. The degree of right ventricular dilatation is perhaps mildly improved with persistent elevation of the RV/LV ratio (1.4) suggestive of residual right heart strain. 3. Increasingly confluent subpleural opacity in the peripheral lingula and posterior segment right upper lobe, given distribution of pulmonary emboli, could reflect developing pulmonary infarcts. Infection less likely. Additional atelectatic changes throughout the lungs. 4. Cholelithiasis without evidence of acute cholecystitis. Additional mural calcifications versus adherent gallstones in the more distal gallbladder. Could consider further characterization with nonemergent right upper quadrant ultrasound. 5.  Aortic Atherosclerosis (ICD10-I70.0). These results were called by telephone at the time of interpretation on 12/27/2019 at 2:21 am to provider Dr. Leonides Schanz, who verbally acknowledged these results. Electronically Signed   By: Lovena Le M.D.   On: 12/27/2019 02:22   CT ANGIO CHEST PE W OR WO CONTRAST  Result Date: 12/23/2019 CLINICAL DATA:  Dyspnea on exertion for 2 weeks, fatigue EXAM: CT ANGIOGRAPHY CHEST WITH CONTRAST TECHNIQUE: Multidetector CT imaging of the chest was performed using the standard protocol during bolus administration of intravenous contrast. Multiplanar CT image reconstructions and MIPs were obtained to evaluate the vascular anatomy. CONTRAST:  82mL OMNIPAQUE IOHEXOL 350 MG/ML SOLN COMPARISON:  08/19/2008, 12/22/2019 FINDINGS: Cardiovascular: This is Tamey Wanek technically adequate evaluation of the pulmonary vasculature. There are large bilateral pulmonary emboli within the main pulmonary arteries. There is significant clot burden, with straightening of the interventricular septum and dilation of the right ventricle consistent with right heart strain. No pericardial effusion. Normal caliber of the thoracic aorta. Mild atherosclerosis of the aorta and coronary vessels. Mediastinum/Nodes: No enlarged mediastinal, hilar, or axillary lymph nodes. Thyroid gland, trachea, and esophagus demonstrate no significant findings. Lungs/Pleura: No airspace disease, effusion, or pneumothorax. The central airways are patent. Upper Abdomen: Calcifications within the gallbladder may reflect cholelithiasis or mural calcification. No evidence of cholecystitis. Gallbladder is incompletely imaged. No other acute upper abdominal findings. Musculoskeletal: No acute or destructive bony lesions. Reconstructed images demonstrate no additional findings. Review of the MIP images confirms the above findings. IMPRESSION: 1. Large bilateral pulmonary emboli with CT evidence of right heart strain (RV/LV Ratio = 3.9) consistent with at  least submassive (intermediate risk) PE. The presence of right heart strain has been associated with an increased risk of morbidity and mortality. 2. Aortic Atherosclerosis (ICD10-I70.0). 3. Gallbladder calcifications which may reflect calcified gallstones or mural calcifications. No evidence of cholecystitis. These results were called by telephone at the time of interpretation on 12/23/2019 at 4:01 am to provider PA Eustaquio Maize , who verbally acknowledged these results. Electronically Signed   By: Randa Ngo M.D.   On: 12/23/2019 04:02   VAS [place] LOWER EXTREMITY VENOUS (DVT)  Result Date: 12/23/2019  Lower Venous DVTStudy Indications: Pulmonary embolism.  Limitations: Room light. Performing Technologist: Antonieta Pert RDMS, RVT  Examination Guidelines: Mayleen Borrero complete evaluation includes B-mode imaging, spectral Doppler, color Doppler, and power Doppler as needed of all accessible portions of each vessel. Bilateral testing is considered an integral part of [place] complete examination. Limited examinations for reoccurring indications may be performed as noted. The reflux portion of the exam is performed with the patient in reverse Trendelenburg.  +---------+---------------+---------+-----------+------------+-----------------+ RIGHT    CompressibilityPhasicitySpontaneityProperties  Thrombus Aging    +---------+---------------+---------+-----------+------------+-----------------+  CFV      Partial        Yes      Yes        softly      Age Indeterminate                                             echogenic                     +---------+---------------+---------+-----------+------------+-----------------+ SFJ      Full                                                             +---------+---------------+---------+-----------+------------+-----------------+ FV Prox  Full                                                              +---------+---------------+---------+-----------+------------+-----------------+ FV Mid   Full                                                             +---------+---------------+---------+-----------+------------+-----------------+ FV DistalFull                                                             +---------+---------------+---------+-----------+------------+-----------------+ PFV      None                               softly      Age Indeterminate                                             echogenic                     +---------+---------------+---------+-----------+------------+-----------------+ POP      None           Yes      Yes        softly      Age Indeterminate                                             echogenic                     +---------+---------------+---------+-----------+------------+-----------------+ PTV      None  Age Indeterminate +---------+---------------+---------+-----------+------------+-----------------+ PERO     None                                           Age Indeterminate +---------+---------------+---------+-----------+------------+-----------------+ GSV      Full                                                             +---------+---------------+---------+-----------+------------+-----------------+ Age indeterminate thrombus seen just below origin of the GSV- extending from the PFV.  +---------+---------------+---------+-----------+----------+--------------+ LEFT     CompressibilityPhasicitySpontaneityPropertiesThrombus Aging +---------+---------------+---------+-----------+----------+--------------+ CFV      Full           Yes      Yes                                 +---------+---------------+---------+-----------+----------+--------------+ SFJ      Full                                                         +---------+---------------+---------+-----------+----------+--------------+ FV Prox  Full                                                        +---------+---------------+---------+-----------+----------+--------------+ FV Mid   Full                                                        +---------+---------------+---------+-----------+----------+--------------+ FV DistalFull                                                        +---------+---------------+---------+-----------+----------+--------------+ PFV      Full                                                        +---------+---------------+---------+-----------+----------+--------------+ POP      Full           Yes      Yes                                 +---------+---------------+---------+-----------+----------+--------------+ PTV      Full                                                        +---------+---------------+---------+-----------+----------+--------------+  PERO     Full                                                        +---------+---------------+---------+-----------+----------+--------------+ GSV      Full                                                        +---------+---------------+---------+-----------+----------+--------------+     Summary: RIGHT: - Findings consistent with age indeterminate deep vein thrombosis involving the right common femoral vein, right proximal profunda vein, right popliteal vein, right posterior tibial veins, and right peroneal veins. - No cystic structure found in the popliteal fossa.  LEFT: - There is no evidence of deep vein thrombosis in the lower extremity.  - No cystic structure found in the popliteal fossa.  *See table(s) above for measurements and observations. Electronically signed by Deitra Mayo MD on 12/23/2019 at 3:33:28 PM.    Final     Microbiology: Recent Results (from the past 240 hour(s))  SARS Coronavirus 2 by RT PCR  (hospital order, performed in [facility] hospital lab) Nasopharyngeal Nasopharyngeal Swab     Status: None   Collection Time: 12/22/19 11:29 PM   Specimen: Nasopharyngeal Swab  Result Value Ref Range Status   SARS Coronavirus 2 NEGATIVE NEGATIVE Final    Comment: (NOTE) SARS-CoV-2 target nucleic acids are NOT DETECTED.  The SARS-CoV-2 RNA is generally detectable in upper and lower respiratory specimens during the acute phase of infection. The lowest concentration of SARS-CoV-2 viral copies this assay can detect is 250 copies / mL. Lydia Toren negative result does not preclude SARS-CoV-2 infection and should not be used as the sole basis for treatment or other patient management decisions.  Dewey Neukam negative result may occur with improper specimen collection / handling, submission of specimen other than nasopharyngeal swab, presence of viral mutation(s) within the areas targeted by this assay, and inadequate number of viral copies (<250 copies / mL). Griffith Santilli negative result must be combined with clinical observations, patient history, and epidemiological information.  Fact Sheet for Patients:   StrictlyIdeas.no  Fact Sheet for Healthcare Providers: BankingDealers.co.za  This test is not yet approved or  cleared by the [place] FDA and has been authorized for detection and/or diagnosis of SARS-CoV-2 by FDA under an Emergency Use Authorization (EUA).  This EUA will remain in effect (meaning this test can be used) for the duration of the COVID-19 declaration under Section 564(b)(1) of the Act, 21 U.S.C. section 360bbb-3(b)(1), unless the authorization is terminated or revoked sooner.  Performed at [facility], [place] [address] [place], [place] [address]   MRSA PCR Screening     Status: None   Collection Time: 12/23/19  5:16 AM   Specimen: Nasal Mucosa; Nasopharyngeal  Result Value Ref Range Status   MRSA by PCR NEGATIVE NEGATIVE Final     Comment:        The GeneXpert MRSA Assay (FDA approved for NASAL specimens only), is one component of Keary Waterson comprehensive MRSA colonization surveillance program. It is not intended to diagnose MRSA infection nor to guide or monitor treatment for MRSA infections. Performed at [facility],  [address] [place], [place] [address]   Blood Culture (routine x 2)     Status: None   Collection Time: 12/23/19  7:19 AM   Specimen: BLOOD RIGHT HAND  Result Value Ref Range Status   Specimen Description BLOOD RIGHT HAND  Final   Special Requests   Final    BOTTLES DRAWN AEROBIC ONLY Blood Culture adequate volume   Culture   Final    NO GROWTH 5 DAYS Performed at [facility], [place] [address] [place], [place] [address]    Report Status 12/28/2019 FINAL  Final  Blood Culture (routine x 2)     Status: None   Collection Time: 12/23/19  7:25 AM   Specimen: BLOOD LEFT HAND  Result Value Ref Range Status   Specimen Description BLOOD LEFT HAND  Final   Special Requests   Final    BOTTLES DRAWN AEROBIC ONLY Blood Culture results may not be optimal due to an inadequate volume of blood received in culture bottles   Culture   Final    NO GROWTH 5 DAYS Performed at [facility], [place] [address] [place], [place] [address]    Report Status 12/28/2019 FINAL  Final  SARS Coronavirus 2 by RT PCR (hospital order, performed in [facility] hospital lab) Nasopharyngeal Nasopharyngeal Swab     Status: None   Collection Time: 12/27/19  3:05 AM   Specimen: Nasopharyngeal Swab  Result Value Ref Range Status   SARS Coronavirus 2 NEGATIVE NEGATIVE Final    Comment: (NOTE) SARS-CoV-2 target nucleic acids are NOT DETECTED.  The SARS-CoV-2 RNA is generally detectable in upper and lower respiratory specimens during the acute phase of infection. The lowest concentration of SARS-CoV-2 viral copies this assay can detect is 250 copies / mL. Monik Lins negative result does not preclude SARS-CoV-2  infection and should not be used as the sole basis for treatment or other patient management decisions.  Deeanna Beightol negative result may occur with improper specimen collection / handling, submission of specimen other than nasopharyngeal swab, presence of viral mutation(s) within the areas targeted by this assay, and inadequate number of viral copies (<250 copies / mL). Domnique Vantine negative result must be combined with clinical observations, patient history, and epidemiological information.  Fact Sheet for Patients:   StrictlyIdeas.no  Fact Sheet for Healthcare Providers: BankingDealers.co.za  This test is not yet approved or  cleared by the [place] FDA and has been authorized for detection and/or diagnosis of SARS-CoV-2 by FDA under an Emergency Use Authorization (EUA).  This EUA will remain in effect (meaning this test can be used) for the duration of the COVID-19 declaration under Section 564(b)(1) of the Act, 21 U.S.C. section 360bbb-3(b)(1), unless the authorization is terminated or revoked sooner.  Performed at [facility], [place] [address], [place], [place] [address]      Labs: Basic Metabolic Panel: Recent Labs  Lab 12/26/19 2137 12/27/19 0418 12/28/19 0305 12/29/19 0331 12/30/19 0219  NA 137 136 138 137 137  K 3.8 3.8 3.7 3.6 3.8  CL 107 108 108 105 102  CO2 21* 19* 23 24 26   GLUCOSE 241* 199* 155* 175* 172*  BUN 11 11 10 13 16   CREATININE 1.12* 0.97 0.87 1.06* 1.10*  CALCIUM 9.2 9.0 9.1 9.3 9.1  MG  --  1.6* 1.7 1.5* 1.9  PHOS  --  2.8 3.5 3.0 3.5   Liver Function Tests: Recent Labs  Lab 12/26/19 2137 12/27/19 0418 12/28/19 0305 12/29/19  2595 12/30/19 0219  AST 23 20 17 17 21   ALT 18 18 16 14 15   ALKPHOS 60 51 49 51 44  BILITOT 0.7 0.7 0.7 0.9 0.9  PROT 6.2* 6.1* 5.9* 6.0* 5.7*  ALBUMIN 2.9* 2.9* 2.7* 2.8* 2.7*   No results for input(s): LIPASE, AMYLASE in the last 168 hours. No results for input(s):  AMMONIA in the last 168 hours. CBC: Recent Labs  Lab 12/26/19 2137 12/26/19 2137 12/27/19 0418 12/28/19 0305 12/29/19 0331 12/29/19 1554 12/30/19 0219  WBC 11.1*  --  10.7* 10.1 8.1  --  8.0  HGB 12.1   < > 11.2* 10.6* 10.5* 10.7* 10.0*  HCT 37.0   < > 35.0* 31.9* 31.6* 32.7* 30.9*  MCV 88.5  --  89.3 87.6 87.3  --  87.5  PLT 254  --  213 242 248  --  261   < > = values in this interval not displayed.   Cardiac Enzymes: No results for input(s): CKTOTAL, CKMB, CKMBINDEX, TROPONINI in the last 168 hours. BNP: BNP (last 3 results) No results for input(s): BNP in the last 8760 hours.  ProBNP (last 3 results) No results for input(s): PROBNP in the last 8760 hours.  CBG: Recent Labs  Lab 12/29/19 1112 12/29/19 1545 12/29/19 2103 12/30/19 0601 12/30/19 1130  GLUCAP 158* 163* 165* 214* 166*       Signed:  Fayrene Helper MD.  [facility] 12/30/2019, 12:45 PM

## 2020-01-01 ENCOUNTER — Other Ambulatory Visit: Payer: Self-pay | Admitting: *Deleted

## 2020-01-01 NOTE — Patient Outreach (Signed)
[facility]) Care Management  01/01/2020  ERNESTA TRABERT [birth year] [id number]   EMMI General Discharge Red Flag Alert   Day: 4 Date: 12/31/19 Red Alert Reason : Who reached Patient  Unfilled prescriptions? Yes   Outreach attempt #1 Subjective : Successful outreach call to patient explained reason for the call and [facility] care management service. Addressed red flag reason : Unfilled prescriptions, patient explained that she has her medications prescribed at discharge, she states that she was referring to mail orders prescription refills that are on the way.  Patient reports feeling much better, no shortness of breath, has oxygen for use a 2 liters for use as needed. She discussed keeping her right leg elevated as recommended, and her daughter is checking on compression hose at store in [place].  Inquired about home health services, patient reports not receiving a call yet, patient agreeable that I can place call to follow up. I  placed call to [place] spoke with Tillie Rung that states home health RN visit for 7/9 and patient should receive a call later today. Return call to patient to relay information .  Patient reports that her Husband and daughter supportive and she will have transportation to follow up visit with MD.  Patient declined any other needs or  concerns at this time.    Plan Will plan case closure.     Joylene Draft, RN, BSN  [place] Management Coordinator  [phone]- Mobile [phone]- Toll Free Main Office

## 2020-01-02 DIAGNOSIS — I82431 Acute embolism and thrombosis of right popliteal vein: Secondary | ICD-10-CM | POA: Diagnosis not present

## 2020-01-02 DIAGNOSIS — K8020 Calculus of gallbladder without cholecystitis without obstruction: Secondary | ICD-10-CM | POA: Diagnosis not present

## 2020-01-02 DIAGNOSIS — I82451 Acute embolism and thrombosis of right peroneal vein: Secondary | ICD-10-CM | POA: Diagnosis not present

## 2020-01-02 DIAGNOSIS — I82491 Acute embolism and thrombosis of other specified deep vein of right lower extremity: Secondary | ICD-10-CM | POA: Diagnosis not present

## 2020-01-02 DIAGNOSIS — E1165 Type 2 diabetes mellitus with hyperglycemia: Secondary | ICD-10-CM | POA: Diagnosis not present

## 2020-01-02 DIAGNOSIS — E785 Hyperlipidemia, unspecified: Secondary | ICD-10-CM | POA: Diagnosis not present

## 2020-01-02 DIAGNOSIS — J9611 Chronic respiratory failure with hypoxia: Secondary | ICD-10-CM | POA: Diagnosis not present

## 2020-01-02 DIAGNOSIS — I2609 Other pulmonary embolism with acute cor pulmonale: Secondary | ICD-10-CM | POA: Diagnosis not present

## 2020-01-02 DIAGNOSIS — I10 Essential (primary) hypertension: Secondary | ICD-10-CM | POA: Diagnosis not present

## 2020-01-03 DIAGNOSIS — I82451 Acute embolism and thrombosis of right peroneal vein: Secondary | ICD-10-CM | POA: Diagnosis not present

## 2020-01-03 DIAGNOSIS — E1165 Type 2 diabetes mellitus with hyperglycemia: Secondary | ICD-10-CM | POA: Diagnosis not present

## 2020-01-03 DIAGNOSIS — K8020 Calculus of gallbladder without cholecystitis without obstruction: Secondary | ICD-10-CM | POA: Diagnosis not present

## 2020-01-03 DIAGNOSIS — I82491 Acute embolism and thrombosis of other specified deep vein of right lower extremity: Secondary | ICD-10-CM | POA: Diagnosis not present

## 2020-01-03 DIAGNOSIS — I10 Essential (primary) hypertension: Secondary | ICD-10-CM | POA: Diagnosis not present

## 2020-01-03 DIAGNOSIS — E785 Hyperlipidemia, unspecified: Secondary | ICD-10-CM | POA: Diagnosis not present

## 2020-01-03 DIAGNOSIS — I2609 Other pulmonary embolism with acute cor pulmonale: Secondary | ICD-10-CM | POA: Diagnosis not present

## 2020-01-03 DIAGNOSIS — I82431 Acute embolism and thrombosis of right popliteal vein: Secondary | ICD-10-CM | POA: Diagnosis not present

## 2020-01-03 DIAGNOSIS — J9611 Chronic respiratory failure with hypoxia: Secondary | ICD-10-CM | POA: Diagnosis not present

## 2020-01-06 DIAGNOSIS — I82431 Acute embolism and thrombosis of right popliteal vein: Secondary | ICD-10-CM | POA: Diagnosis not present

## 2020-01-06 DIAGNOSIS — J9611 Chronic respiratory failure with hypoxia: Secondary | ICD-10-CM | POA: Diagnosis not present

## 2020-01-06 DIAGNOSIS — I10 Essential (primary) hypertension: Secondary | ICD-10-CM | POA: Diagnosis not present

## 2020-01-06 DIAGNOSIS — I82451 Acute embolism and thrombosis of right peroneal vein: Secondary | ICD-10-CM | POA: Diagnosis not present

## 2020-01-06 DIAGNOSIS — K8020 Calculus of gallbladder without cholecystitis without obstruction: Secondary | ICD-10-CM | POA: Diagnosis not present

## 2020-01-06 DIAGNOSIS — E785 Hyperlipidemia, unspecified: Secondary | ICD-10-CM | POA: Diagnosis not present

## 2020-01-06 DIAGNOSIS — I2609 Other pulmonary embolism with acute cor pulmonale: Secondary | ICD-10-CM | POA: Diagnosis not present

## 2020-01-06 DIAGNOSIS — I82491 Acute embolism and thrombosis of other specified deep vein of right lower extremity: Secondary | ICD-10-CM | POA: Diagnosis not present

## 2020-01-06 DIAGNOSIS — E1165 Type 2 diabetes mellitus with hyperglycemia: Secondary | ICD-10-CM | POA: Diagnosis not present

## 2020-01-08 DIAGNOSIS — Z6836 Body mass index (BMI) 36.0-36.9, adult: Secondary | ICD-10-CM | POA: Diagnosis not present

## 2020-01-08 DIAGNOSIS — N183 Chronic kidney disease, stage 3 unspecified: Secondary | ICD-10-CM | POA: Diagnosis not present

## 2020-01-08 DIAGNOSIS — I2609 Other pulmonary embolism with acute cor pulmonale: Secondary | ICD-10-CM | POA: Diagnosis not present

## 2020-01-08 DIAGNOSIS — K3184 Gastroparesis: Secondary | ICD-10-CM | POA: Diagnosis not present

## 2020-01-08 DIAGNOSIS — Z79899 Other long term (current) drug therapy: Secondary | ICD-10-CM | POA: Diagnosis not present

## 2020-01-08 DIAGNOSIS — E1143 Type 2 diabetes mellitus with diabetic autonomic (poly)neuropathy: Secondary | ICD-10-CM | POA: Diagnosis not present

## 2020-01-08 DIAGNOSIS — I2699 Other pulmonary embolism without acute cor pulmonale: Secondary | ICD-10-CM | POA: Diagnosis not present

## 2020-01-08 DIAGNOSIS — Z7689 Persons encountering health services in other specified circumstances: Secondary | ICD-10-CM | POA: Diagnosis not present

## 2020-01-08 DIAGNOSIS — E1122 Type 2 diabetes mellitus with diabetic chronic kidney disease: Secondary | ICD-10-CM | POA: Diagnosis not present

## 2020-01-14 DIAGNOSIS — I82431 Acute embolism and thrombosis of right popliteal vein: Secondary | ICD-10-CM | POA: Diagnosis not present

## 2020-01-14 DIAGNOSIS — I82451 Acute embolism and thrombosis of right peroneal vein: Secondary | ICD-10-CM | POA: Diagnosis not present

## 2020-01-14 DIAGNOSIS — I82491 Acute embolism and thrombosis of other specified deep vein of right lower extremity: Secondary | ICD-10-CM | POA: Diagnosis not present

## 2020-01-14 DIAGNOSIS — I2609 Other pulmonary embolism with acute cor pulmonale: Secondary | ICD-10-CM | POA: Diagnosis not present

## 2020-01-14 DIAGNOSIS — E1165 Type 2 diabetes mellitus with hyperglycemia: Secondary | ICD-10-CM | POA: Diagnosis not present

## 2020-01-14 DIAGNOSIS — I10 Essential (primary) hypertension: Secondary | ICD-10-CM | POA: Diagnosis not present

## 2020-01-14 DIAGNOSIS — K8020 Calculus of gallbladder without cholecystitis without obstruction: Secondary | ICD-10-CM | POA: Diagnosis not present

## 2020-01-14 DIAGNOSIS — J9611 Chronic respiratory failure with hypoxia: Secondary | ICD-10-CM | POA: Diagnosis not present

## 2020-01-14 DIAGNOSIS — E785 Hyperlipidemia, unspecified: Secondary | ICD-10-CM | POA: Diagnosis not present

## 2020-01-15 DIAGNOSIS — I82491 Acute embolism and thrombosis of other specified deep vein of right lower extremity: Secondary | ICD-10-CM | POA: Diagnosis not present

## 2020-01-15 DIAGNOSIS — I10 Essential (primary) hypertension: Secondary | ICD-10-CM | POA: Diagnosis not present

## 2020-01-15 DIAGNOSIS — E1165 Type 2 diabetes mellitus with hyperglycemia: Secondary | ICD-10-CM | POA: Diagnosis not present

## 2020-01-15 DIAGNOSIS — I82451 Acute embolism and thrombosis of right peroneal vein: Secondary | ICD-10-CM | POA: Diagnosis not present

## 2020-01-15 DIAGNOSIS — J9611 Chronic respiratory failure with hypoxia: Secondary | ICD-10-CM | POA: Diagnosis not present

## 2020-01-15 DIAGNOSIS — I2609 Other pulmonary embolism with acute cor pulmonale: Secondary | ICD-10-CM | POA: Diagnosis not present

## 2020-01-15 DIAGNOSIS — I82431 Acute embolism and thrombosis of right popliteal vein: Secondary | ICD-10-CM | POA: Diagnosis not present

## 2020-01-15 DIAGNOSIS — K8020 Calculus of gallbladder without cholecystitis without obstruction: Secondary | ICD-10-CM | POA: Diagnosis not present

## 2020-01-15 DIAGNOSIS — E785 Hyperlipidemia, unspecified: Secondary | ICD-10-CM | POA: Diagnosis not present

## 2020-01-20 DIAGNOSIS — J9611 Chronic respiratory failure with hypoxia: Secondary | ICD-10-CM | POA: Diagnosis not present

## 2020-01-20 DIAGNOSIS — I82491 Acute embolism and thrombosis of other specified deep vein of right lower extremity: Secondary | ICD-10-CM | POA: Diagnosis not present

## 2020-01-20 DIAGNOSIS — I82431 Acute embolism and thrombosis of right popliteal vein: Secondary | ICD-10-CM | POA: Diagnosis not present

## 2020-01-20 DIAGNOSIS — K8020 Calculus of gallbladder without cholecystitis without obstruction: Secondary | ICD-10-CM | POA: Diagnosis not present

## 2020-01-20 DIAGNOSIS — E785 Hyperlipidemia, unspecified: Secondary | ICD-10-CM | POA: Diagnosis not present

## 2020-01-20 DIAGNOSIS — I82451 Acute embolism and thrombosis of right peroneal vein: Secondary | ICD-10-CM | POA: Diagnosis not present

## 2020-01-20 DIAGNOSIS — E1165 Type 2 diabetes mellitus with hyperglycemia: Secondary | ICD-10-CM | POA: Diagnosis not present

## 2020-01-20 DIAGNOSIS — I2609 Other pulmonary embolism with acute cor pulmonale: Secondary | ICD-10-CM | POA: Diagnosis not present

## 2020-01-20 DIAGNOSIS — I10 Essential (primary) hypertension: Secondary | ICD-10-CM | POA: Diagnosis not present

## 2020-01-21 DIAGNOSIS — E785 Hyperlipidemia, unspecified: Secondary | ICD-10-CM | POA: Diagnosis not present

## 2020-01-21 DIAGNOSIS — E1165 Type 2 diabetes mellitus with hyperglycemia: Secondary | ICD-10-CM | POA: Diagnosis not present

## 2020-01-21 DIAGNOSIS — I2609 Other pulmonary embolism with acute cor pulmonale: Secondary | ICD-10-CM | POA: Diagnosis not present

## 2020-01-21 DIAGNOSIS — I82491 Acute embolism and thrombosis of other specified deep vein of right lower extremity: Secondary | ICD-10-CM | POA: Diagnosis not present

## 2020-01-21 DIAGNOSIS — I10 Essential (primary) hypertension: Secondary | ICD-10-CM | POA: Diagnosis not present

## 2020-01-21 DIAGNOSIS — I82451 Acute embolism and thrombosis of right peroneal vein: Secondary | ICD-10-CM | POA: Diagnosis not present

## 2020-01-21 DIAGNOSIS — I82431 Acute embolism and thrombosis of right popliteal vein: Secondary | ICD-10-CM | POA: Diagnosis not present

## 2020-01-21 DIAGNOSIS — J9611 Chronic respiratory failure with hypoxia: Secondary | ICD-10-CM | POA: Diagnosis not present

## 2020-01-21 DIAGNOSIS — K8020 Calculus of gallbladder without cholecystitis without obstruction: Secondary | ICD-10-CM | POA: Diagnosis not present

## 2020-01-22 DIAGNOSIS — N183 Chronic kidney disease, stage 3 unspecified: Secondary | ICD-10-CM | POA: Diagnosis not present

## 2020-01-22 DIAGNOSIS — E1165 Type 2 diabetes mellitus with hyperglycemia: Secondary | ICD-10-CM | POA: Diagnosis not present

## 2020-01-22 DIAGNOSIS — K219 Gastro-esophageal reflux disease without esophagitis: Secondary | ICD-10-CM | POA: Diagnosis not present

## 2020-01-22 DIAGNOSIS — I2699 Other pulmonary embolism without acute cor pulmonale: Secondary | ICD-10-CM | POA: Diagnosis not present

## 2020-01-22 DIAGNOSIS — F329 Major depressive disorder, single episode, unspecified: Secondary | ICD-10-CM | POA: Diagnosis not present

## 2020-01-22 DIAGNOSIS — I10 Essential (primary) hypertension: Secondary | ICD-10-CM | POA: Diagnosis not present

## 2020-01-22 DIAGNOSIS — F0150 Vascular dementia without behavioral disturbance: Secondary | ICD-10-CM | POA: Diagnosis not present

## 2020-01-22 DIAGNOSIS — E1122 Type 2 diabetes mellitus with diabetic chronic kidney disease: Secondary | ICD-10-CM | POA: Diagnosis not present

## 2020-01-23 DIAGNOSIS — E1165 Type 2 diabetes mellitus with hyperglycemia: Secondary | ICD-10-CM | POA: Diagnosis not present

## 2020-01-24 DIAGNOSIS — E1165 Type 2 diabetes mellitus with hyperglycemia: Secondary | ICD-10-CM | POA: Diagnosis not present

## 2020-01-24 DIAGNOSIS — I10 Essential (primary) hypertension: Secondary | ICD-10-CM | POA: Diagnosis not present

## 2020-01-24 DIAGNOSIS — K219 Gastro-esophageal reflux disease without esophagitis: Secondary | ICD-10-CM | POA: Diagnosis not present

## 2020-01-26 DIAGNOSIS — I82491 Acute embolism and thrombosis of other specified deep vein of right lower extremity: Secondary | ICD-10-CM | POA: Diagnosis not present

## 2020-01-26 DIAGNOSIS — I82431 Acute embolism and thrombosis of right popliteal vein: Secondary | ICD-10-CM | POA: Diagnosis not present

## 2020-01-26 DIAGNOSIS — E1165 Type 2 diabetes mellitus with hyperglycemia: Secondary | ICD-10-CM | POA: Diagnosis not present

## 2020-01-26 DIAGNOSIS — I82451 Acute embolism and thrombosis of right peroneal vein: Secondary | ICD-10-CM | POA: Diagnosis not present

## 2020-01-26 DIAGNOSIS — I10 Essential (primary) hypertension: Secondary | ICD-10-CM | POA: Diagnosis not present

## 2020-01-26 DIAGNOSIS — K8020 Calculus of gallbladder without cholecystitis without obstruction: Secondary | ICD-10-CM | POA: Diagnosis not present

## 2020-01-26 DIAGNOSIS — J9611 Chronic respiratory failure with hypoxia: Secondary | ICD-10-CM | POA: Diagnosis not present

## 2020-01-26 DIAGNOSIS — E785 Hyperlipidemia, unspecified: Secondary | ICD-10-CM | POA: Diagnosis not present

## 2020-01-26 DIAGNOSIS — I2609 Other pulmonary embolism with acute cor pulmonale: Secondary | ICD-10-CM | POA: Diagnosis not present

## 2020-01-28 DIAGNOSIS — I82431 Acute embolism and thrombosis of right popliteal vein: Secondary | ICD-10-CM | POA: Diagnosis not present

## 2020-01-28 DIAGNOSIS — E1165 Type 2 diabetes mellitus with hyperglycemia: Secondary | ICD-10-CM | POA: Diagnosis not present

## 2020-01-28 DIAGNOSIS — J9611 Chronic respiratory failure with hypoxia: Secondary | ICD-10-CM | POA: Diagnosis not present

## 2020-01-28 DIAGNOSIS — I2609 Other pulmonary embolism with acute cor pulmonale: Secondary | ICD-10-CM | POA: Diagnosis not present

## 2020-01-28 DIAGNOSIS — K8020 Calculus of gallbladder without cholecystitis without obstruction: Secondary | ICD-10-CM | POA: Diagnosis not present

## 2020-01-28 DIAGNOSIS — I82451 Acute embolism and thrombosis of right peroneal vein: Secondary | ICD-10-CM | POA: Diagnosis not present

## 2020-01-28 DIAGNOSIS — E785 Hyperlipidemia, unspecified: Secondary | ICD-10-CM | POA: Diagnosis not present

## 2020-01-28 DIAGNOSIS — I10 Essential (primary) hypertension: Secondary | ICD-10-CM | POA: Diagnosis not present

## 2020-01-28 DIAGNOSIS — I82491 Acute embolism and thrombosis of other specified deep vein of right lower extremity: Secondary | ICD-10-CM | POA: Diagnosis not present

## 2020-02-01 DIAGNOSIS — J9611 Chronic respiratory failure with hypoxia: Secondary | ICD-10-CM | POA: Diagnosis not present

## 2020-02-01 DIAGNOSIS — I2609 Other pulmonary embolism with acute cor pulmonale: Secondary | ICD-10-CM | POA: Diagnosis not present

## 2020-02-01 DIAGNOSIS — I82451 Acute embolism and thrombosis of right peroneal vein: Secondary | ICD-10-CM | POA: Diagnosis not present

## 2020-02-01 DIAGNOSIS — K8020 Calculus of gallbladder without cholecystitis without obstruction: Secondary | ICD-10-CM | POA: Diagnosis not present

## 2020-02-01 DIAGNOSIS — I10 Essential (primary) hypertension: Secondary | ICD-10-CM | POA: Diagnosis not present

## 2020-02-01 DIAGNOSIS — E785 Hyperlipidemia, unspecified: Secondary | ICD-10-CM | POA: Diagnosis not present

## 2020-02-01 DIAGNOSIS — I82491 Acute embolism and thrombosis of other specified deep vein of right lower extremity: Secondary | ICD-10-CM | POA: Diagnosis not present

## 2020-02-01 DIAGNOSIS — I82431 Acute embolism and thrombosis of right popliteal vein: Secondary | ICD-10-CM | POA: Diagnosis not present

## 2020-02-01 DIAGNOSIS — E1165 Type 2 diabetes mellitus with hyperglycemia: Secondary | ICD-10-CM | POA: Diagnosis not present

## 2020-02-02 ENCOUNTER — Encounter (HOSPITAL_COMMUNITY): Payer: Self-pay | Admitting: Internal Medicine

## 2020-02-03 DIAGNOSIS — I10 Essential (primary) hypertension: Secondary | ICD-10-CM | POA: Diagnosis not present

## 2020-02-03 DIAGNOSIS — K8020 Calculus of gallbladder without cholecystitis without obstruction: Secondary | ICD-10-CM | POA: Diagnosis not present

## 2020-02-03 DIAGNOSIS — I82431 Acute embolism and thrombosis of right popliteal vein: Secondary | ICD-10-CM | POA: Diagnosis not present

## 2020-02-03 DIAGNOSIS — E785 Hyperlipidemia, unspecified: Secondary | ICD-10-CM | POA: Diagnosis not present

## 2020-02-03 DIAGNOSIS — I82451 Acute embolism and thrombosis of right peroneal vein: Secondary | ICD-10-CM | POA: Diagnosis not present

## 2020-02-03 DIAGNOSIS — I82491 Acute embolism and thrombosis of other specified deep vein of right lower extremity: Secondary | ICD-10-CM | POA: Diagnosis not present

## 2020-02-03 DIAGNOSIS — I2609 Other pulmonary embolism with acute cor pulmonale: Secondary | ICD-10-CM | POA: Diagnosis not present

## 2020-02-03 DIAGNOSIS — E1165 Type 2 diabetes mellitus with hyperglycemia: Secondary | ICD-10-CM | POA: Diagnosis not present

## 2020-02-03 DIAGNOSIS — J9611 Chronic respiratory failure with hypoxia: Secondary | ICD-10-CM | POA: Diagnosis not present

## 2020-02-04 ENCOUNTER — Ambulatory Visit: Payer: Medicare HMO | Admitting: Gastroenterology

## 2020-02-11 DIAGNOSIS — I2609 Other pulmonary embolism with acute cor pulmonale: Secondary | ICD-10-CM | POA: Diagnosis not present

## 2020-02-11 DIAGNOSIS — E1165 Type 2 diabetes mellitus with hyperglycemia: Secondary | ICD-10-CM | POA: Diagnosis not present

## 2020-02-11 DIAGNOSIS — I10 Essential (primary) hypertension: Secondary | ICD-10-CM | POA: Diagnosis not present

## 2020-02-11 DIAGNOSIS — E785 Hyperlipidemia, unspecified: Secondary | ICD-10-CM | POA: Diagnosis not present

## 2020-02-11 DIAGNOSIS — J9611 Chronic respiratory failure with hypoxia: Secondary | ICD-10-CM | POA: Diagnosis not present

## 2020-02-11 DIAGNOSIS — I82431 Acute embolism and thrombosis of right popliteal vein: Secondary | ICD-10-CM | POA: Diagnosis not present

## 2020-02-11 DIAGNOSIS — I82491 Acute embolism and thrombosis of other specified deep vein of right lower extremity: Secondary | ICD-10-CM | POA: Diagnosis not present

## 2020-02-11 DIAGNOSIS — K8020 Calculus of gallbladder without cholecystitis without obstruction: Secondary | ICD-10-CM | POA: Diagnosis not present

## 2020-02-11 DIAGNOSIS — I82451 Acute embolism and thrombosis of right peroneal vein: Secondary | ICD-10-CM | POA: Diagnosis not present

## 2020-02-13 DIAGNOSIS — J9611 Chronic respiratory failure with hypoxia: Secondary | ICD-10-CM | POA: Diagnosis not present

## 2020-02-13 DIAGNOSIS — I10 Essential (primary) hypertension: Secondary | ICD-10-CM | POA: Diagnosis not present

## 2020-02-13 DIAGNOSIS — K8020 Calculus of gallbladder without cholecystitis without obstruction: Secondary | ICD-10-CM | POA: Diagnosis not present

## 2020-02-13 DIAGNOSIS — I82431 Acute embolism and thrombosis of right popliteal vein: Secondary | ICD-10-CM | POA: Diagnosis not present

## 2020-02-13 DIAGNOSIS — E785 Hyperlipidemia, unspecified: Secondary | ICD-10-CM | POA: Diagnosis not present

## 2020-02-13 DIAGNOSIS — I82451 Acute embolism and thrombosis of right peroneal vein: Secondary | ICD-10-CM | POA: Diagnosis not present

## 2020-02-13 DIAGNOSIS — I2609 Other pulmonary embolism with acute cor pulmonale: Secondary | ICD-10-CM | POA: Diagnosis not present

## 2020-02-13 DIAGNOSIS — E1165 Type 2 diabetes mellitus with hyperglycemia: Secondary | ICD-10-CM | POA: Diagnosis not present

## 2020-02-13 DIAGNOSIS — I82491 Acute embolism and thrombosis of other specified deep vein of right lower extremity: Secondary | ICD-10-CM | POA: Diagnosis not present

## 2020-02-16 DIAGNOSIS — R197 Diarrhea, unspecified: Secondary | ICD-10-CM | POA: Diagnosis not present

## 2020-02-16 DIAGNOSIS — E1165 Type 2 diabetes mellitus with hyperglycemia: Secondary | ICD-10-CM | POA: Diagnosis not present

## 2020-02-16 DIAGNOSIS — Z6834 Body mass index (BMI) 34.0-34.9, adult: Secondary | ICD-10-CM | POA: Diagnosis not present

## 2020-02-17 DIAGNOSIS — R197 Diarrhea, unspecified: Secondary | ICD-10-CM | POA: Diagnosis not present

## 2020-02-18 DIAGNOSIS — K8020 Calculus of gallbladder without cholecystitis without obstruction: Secondary | ICD-10-CM | POA: Diagnosis not present

## 2020-02-18 DIAGNOSIS — I82491 Acute embolism and thrombosis of other specified deep vein of right lower extremity: Secondary | ICD-10-CM | POA: Diagnosis not present

## 2020-02-18 DIAGNOSIS — I82451 Acute embolism and thrombosis of right peroneal vein: Secondary | ICD-10-CM | POA: Diagnosis not present

## 2020-02-18 DIAGNOSIS — J9611 Chronic respiratory failure with hypoxia: Secondary | ICD-10-CM | POA: Diagnosis not present

## 2020-02-18 DIAGNOSIS — E785 Hyperlipidemia, unspecified: Secondary | ICD-10-CM | POA: Diagnosis not present

## 2020-02-18 DIAGNOSIS — E1165 Type 2 diabetes mellitus with hyperglycemia: Secondary | ICD-10-CM | POA: Diagnosis not present

## 2020-02-18 DIAGNOSIS — I82431 Acute embolism and thrombosis of right popliteal vein: Secondary | ICD-10-CM | POA: Diagnosis not present

## 2020-02-18 DIAGNOSIS — I2609 Other pulmonary embolism with acute cor pulmonale: Secondary | ICD-10-CM | POA: Diagnosis not present

## 2020-02-18 DIAGNOSIS — I10 Essential (primary) hypertension: Secondary | ICD-10-CM | POA: Diagnosis not present

## 2020-02-20 ENCOUNTER — Ambulatory Visit: Payer: Medicare HMO | Admitting: Pulmonary Disease

## 2020-02-20 ENCOUNTER — Encounter: Payer: Self-pay | Admitting: Pulmonary Disease

## 2020-02-20 ENCOUNTER — Other Ambulatory Visit: Payer: Self-pay

## 2020-02-20 VITALS — BP 128/74 | HR 81 | Ht 63.0 in | Wt 191.0 lb

## 2020-02-20 DIAGNOSIS — I10 Essential (primary) hypertension: Secondary | ICD-10-CM | POA: Diagnosis not present

## 2020-02-20 DIAGNOSIS — R109 Unspecified abdominal pain: Secondary | ICD-10-CM

## 2020-02-20 DIAGNOSIS — I2699 Other pulmonary embolism without acute cor pulmonale: Secondary | ICD-10-CM

## 2020-02-20 DIAGNOSIS — R413 Other amnesia: Secondary | ICD-10-CM | POA: Diagnosis not present

## 2020-02-20 DIAGNOSIS — R197 Diarrhea, unspecified: Secondary | ICD-10-CM

## 2020-02-20 DIAGNOSIS — E785 Hyperlipidemia, unspecified: Secondary | ICD-10-CM | POA: Diagnosis not present

## 2020-02-20 DIAGNOSIS — I82451 Acute embolism and thrombosis of right peroneal vein: Secondary | ICD-10-CM | POA: Diagnosis not present

## 2020-02-20 DIAGNOSIS — J9611 Chronic respiratory failure with hypoxia: Secondary | ICD-10-CM | POA: Diagnosis not present

## 2020-02-20 DIAGNOSIS — I82491 Acute embolism and thrombosis of other specified deep vein of right lower extremity: Secondary | ICD-10-CM | POA: Diagnosis not present

## 2020-02-20 DIAGNOSIS — K8020 Calculus of gallbladder without cholecystitis without obstruction: Secondary | ICD-10-CM | POA: Diagnosis not present

## 2020-02-20 DIAGNOSIS — I2609 Other pulmonary embolism with acute cor pulmonale: Secondary | ICD-10-CM | POA: Diagnosis not present

## 2020-02-20 DIAGNOSIS — I82431 Acute embolism and thrombosis of right popliteal vein: Secondary | ICD-10-CM | POA: Diagnosis not present

## 2020-02-20 DIAGNOSIS — E1165 Type 2 diabetes mellitus with hyperglycemia: Secondary | ICD-10-CM | POA: Diagnosis not present

## 2020-02-20 NOTE — Patient Instructions (Signed)
We will check labs today and give you a call with the results I will talk with my colleagues about whether to keep the eliquis or switch to a new blood thinner for the clots We will refer you to GI for the diarrhea, nausea and abdominal discomfort We will check a CT abdomen/pelvis for the GI symptoms We will refer you to neurology for the memory changes

## 2020-02-20 NOTE — Progress Notes (Signed)
Patient ID: Dawn Peterson, female    DOB: [birth year], 79 y.o.   MRN: [id number]  Chief Complaint  Patient presents with  . Consult    Pt is a self referral due to blood clots in lungs.  Pt has been sick off and on since March 2021. Pt was admitted to hospital 6/28-7/2 and once discharged had to be readmitted 7/2-7/6. Pt denies any current complaints of increased SOB, cough, chest tightness, or wheezing.    Referring provider: Serita Grammes  HPI: Dawn Peterson is a 79 year old woman, non-smoker with history of diabetes mellitus type II and hypertension who was admitted for pulmonary emboli and DVT on 12/22/19 and 12/26/19, who returns to pulmonary clinic for hospital follow up.   She reports since leaving the hospital the second time that her breathing has returned to baseline and she does not report issues with shortness of breath. She denies chest pain, feeling light headed or dizzy. She denies palpitations. She denies leg swelling or pain. She does complain of fatigue and lack of energy. She reports a significant decline in her daily function and is confirmed by her daughter who has accompanied her on today's visit.   She complains of upset stomach over the past 2 weeks. She has persistent diarrhea requiring imodium which was present prior to her hospitalization. Her blood sugars have been more difficult to control this year as well. She denies any bloody or dark stools. She has had lack of appetite recently. Denies fevers, chills or sweats. Along with her reduced physical activity she also reports memory issues just prior to hospitalization and brain fog which improved while she was in the hospital on heparin and returned after leaving the hospital on eliquis.   She was given thrombolytic therapy and treated with heparin drip during her initial admission. She was then transitioned to eliquis and discharged home. While at home she again developed altered mentation and shortness of breath.  She was started on a heparin drip and then transitioned to eliquis once again.    Labs and imaging reviewed from her recent hospitalizations. TSH 3.3, Free T4 1.12, vitamin b12 345. LFTs within normal range. CTA chest on 12/23/19 and 12/27/19 showed similar clot burdens which included bilateral emboli of the main pulmonary arteries that extended into lobar and segmental branches. There was evidence of right heart strain.   Allergies  Allergen Reactions  . Predicort [Prednisolone] Other (See Comments)    Per daughter "makes her crazy"  . Penicillins Rash and Other (See Comments)    Childhood allergy, pt believes she broke out in her mouth     There is no immunization history on file for this patient.  Past Medical History:  Diagnosis Date  . Diabetes mellitus without complication ([place])   . Hypertension   v   Tobacco History: Social History   Tobacco Use  Smoking Status Never Smoker  Smokeless Tobacco Never Used   Counseling given: Not Answered   Outpatient Medications Prior to Visit  Medication Sig Dispense Refill  . amLODipine-valsartan (EXFORGE) 5-320 MG tablet     . apixaban (ELIQUIS) 5 MG TABS tablet Please take 10 mg oral twice daily for next 5 days, then transition to 5 mg oral twice daily on 01/04/2020 70 tablet 0  . chlorthalidone (HYGROTON) 25 MG tablet Take 12.5 mg by mouth daily.    . cholecalciferol (VITAMIN D3) 25 MCG (1000 UNIT) tablet Take 1,000 Units by mouth daily.    Marland Kitchen escitalopram (LEXAPRO)  10 MG tablet Take 10 mg by mouth daily.    Marland Kitchen FARXIGA 10 MG TABS tablet Take 10 mg by mouth daily.    Marland Kitchen loperamide (IMODIUM) 2 MG capsule Take 2 mg by mouth at bedtime.    . metFORMIN (GLUCOPHAGE-XR) 500 MG 24 hr tablet     . pantoprazole (PROTONIX) 40 MG tablet Take 1 tablet (40 mg total) by mouth daily. 30 tablet 2  . rosuvastatin (CRESTOR) 20 MG tablet Take 20 mg by mouth at bedtime.    . vitamin B-12 100 MCG tablet Take 1 tablet (100 mcg total) by mouth daily.     No  facility-administered medications prior to visit.     Review of Systems  Constitutional: Positive for malaise/fatigue. Negative for chills, diaphoresis, fever and weight loss.  HENT: Negative for congestion, nosebleeds, sinus pain and sore throat.   Eyes: Negative for blurred vision, double vision and pain.  Respiratory: Negative for cough, hemoptysis, sputum production, shortness of breath and wheezing.   Cardiovascular: Negative for chest pain, palpitations, orthopnea, leg swelling and PND.  Gastrointestinal: Positive for abdominal pain, diarrhea, nausea and vomiting. Negative for blood in stool, heartburn and melena.  Genitourinary: Negative for dysuria, frequency, hematuria and urgency.  Musculoskeletal: Negative for back pain, falls, joint pain, myalgias and neck pain.  Skin: Negative for itching and rash.  Neurological: Negative for dizziness, focal weakness, seizures, weakness and headaches.       Feels foggy and reports memory issues  Endo/Heme/Allergies: Does not bruise/bleed easily.  Psychiatric/Behavioral: Positive for memory loss. Negative for depression, hallucinations and substance abuse. The patient is not nervous/anxious.       Physical Exam  BP 128/74 (BP Location: Left Wrist, Cuff Size: Normal)   Pulse 81   Ht 5\' 3"  (1.6 m)   Wt 191 lb (86.6 kg)   SpO2 98%   BMI 33.83 kg/m   Physical Exam Constitutional:      General: She is not in acute distress.    Appearance: Normal appearance. She is obese. She is not ill-appearing.  HENT:     Head: Normocephalic and atraumatic.     Nose: Nose normal.     Mouth/Throat:     Mouth: Mucous membranes are moist.     Pharynx: Oropharynx is clear.  Eyes:     Extraocular Movements: Extraocular movements intact.     Conjunctiva/sclera: Conjunctivae normal.     Pupils: Pupils are equal, round, and reactive to light.  Cardiovascular:     Rate and Rhythm: Normal rate and regular rhythm.     Pulses: Normal pulses.     Heart  sounds: Normal heart sounds.  Pulmonary:     Effort: Pulmonary effort is normal.     Breath sounds: Normal breath sounds. No wheezing, rhonchi or rales.  Abdominal:     General: Abdomen is flat. There is no distension.     Palpations: Abdomen is soft.     Tenderness: There is no abdominal tenderness. There is no guarding.     Comments: Increased bowel sounds  Musculoskeletal:        General: No swelling or deformity.     Cervical back: Normal range of motion and neck supple.     Right lower leg: No edema.     Left lower leg: No edema.  Skin:    General: Skin is warm and dry.  Neurological:     General: No focal deficit present.     Mental Status: She is alert and  oriented to person, place, and time. Mental status is at baseline.     Gait: Gait normal.  Psychiatric:        Mood and Affect: Mood normal.        Behavior: Behavior normal.        Thought Content: Thought content normal.        Judgment: Judgment normal.       Lab Results:  CBC    Component Value Date/Time   WBC 8.0 12/30/2019 0219   RBC 3.53 (L) 12/30/2019 0219   HGB 10.0 (L) 12/30/2019 0219   HCT 30.9 (L) 12/30/2019 0219   PLT 261 12/30/2019 0219   MCV 87.5 12/30/2019 0219   MCH 28.3 12/30/2019 0219   MCHC 32.4 12/30/2019 0219   RDW 13.8 12/30/2019 0219   LYMPHSABS 2.7 12/22/2019 2329   MONOABS 1.5 (H) 12/22/2019 2329   EOSABS 0.4 12/22/2019 2329   BASOSABS 0.1 12/22/2019 2329    BMET    Component Value Date/Time   NA 137 12/30/2019 0219   K 3.8 12/30/2019 0219   CL 102 12/30/2019 0219   CO2 26 12/30/2019 0219   GLUCOSE 172 (H) 12/30/2019 0219   BUN 16 12/30/2019 0219   CREATININE 1.10 (H) 12/30/2019 0219   CALCIUM 9.1 12/30/2019 0219   GFRNONAA 48 (L) 12/30/2019 0219   GFRAA 56 (L) 12/30/2019 0219    Assessment & Plan:   Dawn Peterson is a 79 year old woman, non-smoker with history of diabetes mellitus type II and hypertension who was admitted for pulmonary emboli and DVT on 12/22/19  and 12/26/19, who returns to pulmonary clinic for hospital follow up.   She appears to be doing well on eliquis therapy for the pulmonary emboli and DVT as her breathing has returned to baseline. There is concern that she returned to the hospital with similar clot burden after receiving thrombolytic therapy followed by anticoagulation. The patient and her daughter remain concerned that she continues to show similar symptoms of altered mentation and fatigue similar to when she presented with the blood clots. She is having memory issues and continues to have diarrhea, dyspepsia and nausea.   Plan: - For further workup of the etiology of her blood clots aside from increasing sedentary lifestyle is to check a CT abdomen/pelvis to rule out any possible solid tumor component.  - We will refer her to GI for further evaluation of her constellation of GI symptoms. Differential includes possible pancreatic insufficiency - We will check an Anti Xa level to determine whether the eliquis is therapeutic. She is not in the morbidly obese category where we would think the eliquis levels would be subtherapeutic. Will discuss case with my colleagues who managed the patient's care in the hospital to determine if the eliquis remains appropriate for her. - Refer patient to neurology for concerns of memory changes - Patient to have follow up echo to monitor her acute right heart strain from the pulmonary emboli  Patient to follow up in 3 months  Freddi Starr, MD 02/20/2020

## 2020-02-23 ENCOUNTER — Encounter: Payer: Self-pay | Admitting: Nurse Practitioner

## 2020-02-23 ENCOUNTER — Telehealth: Payer: Self-pay | Admitting: Pulmonary Disease

## 2020-02-23 LAB — HEPARIN ANTI-XA: Heparin Anti-Xa: >2 IU/mL — ABNORMAL HIGH

## 2020-02-23 NOTE — Telephone Encounter (Signed)
Received call report from [place] with [facility] on patient's Heparin Anti-X done on 02/20/20 JD please review the result/impression copied below:   Ref Range & Units 3 d ago  Heparin Anti-Xa IU/mL >2.00High   Comment: .  THIS RESULT HAS BEEN VERIFIED BY REPEAT ANALYSIS.      Please advise, thank you.

## 2020-02-23 NOTE — Telephone Encounter (Signed)
Results were sent to Dr. Erin Fulling as call report, waiting for his response and recommendations

## 2020-02-24 ENCOUNTER — Ambulatory Visit (HOSPITAL_COMMUNITY): Payer: Medicare HMO | Attending: Internal Medicine

## 2020-02-24 ENCOUNTER — Other Ambulatory Visit: Payer: Self-pay

## 2020-02-24 DIAGNOSIS — I10 Essential (primary) hypertension: Secondary | ICD-10-CM | POA: Insufficient documentation

## 2020-02-24 DIAGNOSIS — I2781 Cor pulmonale (chronic): Secondary | ICD-10-CM | POA: Diagnosis not present

## 2020-02-24 DIAGNOSIS — I517 Cardiomegaly: Secondary | ICD-10-CM

## 2020-02-24 DIAGNOSIS — K219 Gastro-esophageal reflux disease without esophagitis: Secondary | ICD-10-CM | POA: Diagnosis not present

## 2020-02-24 DIAGNOSIS — E119 Type 2 diabetes mellitus without complications: Secondary | ICD-10-CM | POA: Diagnosis not present

## 2020-02-24 DIAGNOSIS — E1165 Type 2 diabetes mellitus with hyperglycemia: Secondary | ICD-10-CM | POA: Diagnosis not present

## 2020-02-24 NOTE — Telephone Encounter (Signed)
Freddi Starr, MD  You 1 hour ago (8:50 AM)   This is a normal lab result in the setting of eliquis (apixiban) therapy. Please call the patient's daughter and let them know we will continue the eliquis therapy for her PE and DVT as the lab result is reassuring it is working.    Routing comment     Called spoke with daughter, let her know Dr. Lisabeth Pick recommendations. Nothing further needed at this time.

## 2020-02-24 NOTE — Telephone Encounter (Signed)
See other phone note from 02/23/20. Will sign off.

## 2020-02-25 LAB — ECHOCARDIOGRAM COMPLETE
Area-P 1/2: 2.52 cm2
S' Lateral: 2.4 cm

## 2020-02-27 DIAGNOSIS — K8020 Calculus of gallbladder without cholecystitis without obstruction: Secondary | ICD-10-CM | POA: Diagnosis not present

## 2020-02-27 DIAGNOSIS — J9611 Chronic respiratory failure with hypoxia: Secondary | ICD-10-CM | POA: Diagnosis not present

## 2020-02-27 DIAGNOSIS — I2609 Other pulmonary embolism with acute cor pulmonale: Secondary | ICD-10-CM | POA: Diagnosis not present

## 2020-02-27 DIAGNOSIS — I82451 Acute embolism and thrombosis of right peroneal vein: Secondary | ICD-10-CM | POA: Diagnosis not present

## 2020-02-27 DIAGNOSIS — E785 Hyperlipidemia, unspecified: Secondary | ICD-10-CM | POA: Diagnosis not present

## 2020-02-27 DIAGNOSIS — I82431 Acute embolism and thrombosis of right popliteal vein: Secondary | ICD-10-CM | POA: Diagnosis not present

## 2020-02-27 DIAGNOSIS — E1165 Type 2 diabetes mellitus with hyperglycemia: Secondary | ICD-10-CM | POA: Diagnosis not present

## 2020-02-27 DIAGNOSIS — I10 Essential (primary) hypertension: Secondary | ICD-10-CM | POA: Diagnosis not present

## 2020-02-27 DIAGNOSIS — I82491 Acute embolism and thrombosis of other specified deep vein of right lower extremity: Secondary | ICD-10-CM | POA: Diagnosis not present

## 2020-03-02 DIAGNOSIS — N183 Chronic kidney disease, stage 3 unspecified: Secondary | ICD-10-CM | POA: Diagnosis not present

## 2020-03-02 DIAGNOSIS — Z6835 Body mass index (BMI) 35.0-35.9, adult: Secondary | ICD-10-CM | POA: Diagnosis not present

## 2020-03-02 DIAGNOSIS — E1122 Type 2 diabetes mellitus with diabetic chronic kidney disease: Secondary | ICD-10-CM | POA: Diagnosis not present

## 2020-03-02 DIAGNOSIS — E1143 Type 2 diabetes mellitus with diabetic autonomic (poly)neuropathy: Secondary | ICD-10-CM | POA: Diagnosis not present

## 2020-03-02 DIAGNOSIS — K3184 Gastroparesis: Secondary | ICD-10-CM | POA: Diagnosis not present

## 2020-03-05 ENCOUNTER — Ambulatory Visit
Admission: RE | Admit: 2020-03-05 | Discharge: 2020-03-05 | Disposition: A | Discharge status: Home / Self Care | Payer: Medicare HMO | Source: Ambulatory Visit | Attending: Pulmonary Disease | Admitting: Pulmonary Disease

## 2020-03-05 DIAGNOSIS — R109 Unspecified abdominal pain: Secondary | ICD-10-CM

## 2020-03-05 DIAGNOSIS — K5730 Diverticulosis of large intestine without perforation or abscess without bleeding: Secondary | ICD-10-CM | POA: Diagnosis not present

## 2020-03-05 MED ORDER — IOPAMIDOL (ISOVUE-300) INJECTION 61%
100.0000 mL | Freq: Once | INTRAVENOUS | Status: AC | PRN
  Administered 2020-03-05: 80 mL via INTRAVENOUS

## 2020-03-08 DIAGNOSIS — E1165 Type 2 diabetes mellitus with hyperglycemia: Secondary | ICD-10-CM | POA: Diagnosis not present

## 2020-03-19 DIAGNOSIS — Z1211 Encounter for screening for malignant neoplasm of colon: Secondary | ICD-10-CM | POA: Diagnosis not present

## 2020-03-19 DIAGNOSIS — Z6835 Body mass index (BMI) 35.0-35.9, adult: Secondary | ICD-10-CM | POA: Diagnosis not present

## 2020-03-19 DIAGNOSIS — N3001 Acute cystitis with hematuria: Secondary | ICD-10-CM | POA: Diagnosis not present

## 2020-03-23 NOTE — Telephone Encounter (Signed)
Dr. Erin Fulling please advise on patient mychart message  Hi, when my mom was there and saw Dr. Erin Fulling, he mentioned that he was going to discuss her case with his colleagues who had seen her in the hospital. I was wondering if he had been able to do that. Also, we would like a referral to a primary care doctor in the [facility] system because her primary has retired.  Thank you,  Dawn Peterson

## 2020-03-25 DIAGNOSIS — E1165 Type 2 diabetes mellitus with hyperglycemia: Secondary | ICD-10-CM | POA: Diagnosis not present

## 2020-03-25 DIAGNOSIS — K219 Gastro-esophageal reflux disease without esophagitis: Secondary | ICD-10-CM | POA: Diagnosis not present

## 2020-03-25 DIAGNOSIS — I10 Essential (primary) hypertension: Secondary | ICD-10-CM | POA: Diagnosis not present

## 2020-03-30 ENCOUNTER — Ambulatory Visit: Payer: Medicare HMO | Admitting: Gastroenterology

## 2020-03-31 ENCOUNTER — Ambulatory Visit: Payer: Medicare HMO | Admitting: Nurse Practitioner

## 2020-04-01 ENCOUNTER — Ambulatory Visit: Payer: Medicare HMO | Admitting: Nurse Practitioner

## 2020-04-07 ENCOUNTER — Ambulatory Visit: Payer: Medicare HMO | Admitting: Nurse Practitioner

## 2020-04-07 ENCOUNTER — Telehealth: Payer: Self-pay

## 2020-04-07 ENCOUNTER — Encounter: Payer: Self-pay | Admitting: Nurse Practitioner

## 2020-04-07 VITALS — BP 82/52 | HR 80 | Ht 60.0 in | Wt 194.4 lb

## 2020-04-07 DIAGNOSIS — R935 Abnormal findings on diagnostic imaging of other abdominal regions, including retroperitoneum: Secondary | ICD-10-CM | POA: Diagnosis not present

## 2020-04-07 DIAGNOSIS — R194 Change in bowel habit: Secondary | ICD-10-CM | POA: Diagnosis not present

## 2020-04-07 DIAGNOSIS — D649 Anemia, unspecified: Secondary | ICD-10-CM | POA: Diagnosis not present

## 2020-04-07 DIAGNOSIS — Z7901 Long term (current) use of anticoagulants: Secondary | ICD-10-CM

## 2020-04-07 MED ORDER — SUTAB 1479-225-188 MG PO TABS: 1.0000 | ORAL_TABLET | Freq: Once | ORAL | 0 refills | Status: AC

## 2020-04-07 NOTE — Patient Instructions (Signed)
If you are age 77 or older, your body mass index should be between 23-30. Your Body mass index is 37.96 kg/m. If this is out of the aforementioned range listed, please consider follow up with your Primary Care Provider.  If you are age 51 or younger, your body mass index should be between 19-25. Your Body mass index is 37.96 kg/m. If this is out of the aformentioned range listed, please consider follow up with your Primary Care Provider.     You have been scheduled for a colonoscopy. Please follow written instructions given to you at your visit today.  Please pick up your prep supplies at the pharmacy within the next 1-3 days. If you use inhalers (even only as needed), please bring them with you on the day of your procedure.

## 2020-04-07 NOTE — Telephone Encounter (Signed)
  04/07/2020   RE: Dawn Peterson DOB: Dec 07, 1940 MRN: [id number]   Dear Dr. Erin Fulling,    We have scheduled the above patient for an endoscopic procedure. Our records show that she is on anticoagulation therapy.   Please advise as to how long the patient may come off her therapy of Eliquis prior to the procedure, which is scheduled for 05/06/2020.  Please fax back/ or route the completed form to [place] at [phone].   Sincerely,    Phillis Haggis

## 2020-04-07 NOTE — Progress Notes (Signed)
ASSESSMENT AND PLAN     # Bowel changes / abnormal rectosigmoid colon on CT scan --Increased frequency of partially formed stool for almost a year. Denies flagrant diarrhea.  --No significant weight loss. No blood in stool per patient ( but hospital discharge in July 2021 mentions episode of blood in stool) .    --Normal TSH --Etiology of bowel changes unclear, doesn't seem medication related. CT scan last month mentions mildly thickened short segment of rectosigmoid colon. Patient needs a colonoscopy to evaluate CT scan findings, bowel changes and also anemia. The risks and benefits of colonoscopy with possible polypectomy / biopsies were discussed and the patient / family agree to proceed.    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  --Hold Eliquis for 2 days before procedure - will instruct when and how to resume after procedure. Patient understands that there is a low but real risk of cardiovascular event such as heart attack, stroke, or embolism /  thrombosis while off blood thinner. The patient consents to proceed as long as Pulmonologist is agreeable to her holding Eliquis. Will communicate by phone or EMR with Pulmonologist to confirm that holding Eliquis is reasonable in this case.   # [place] anemia --Her hgb was 14.5 in June 2021 when found to have PE. Unsure of baseline hgb as there are no prior labs. During that admission her hgb declined but remained stable in the 10 range.  --She denies rectal bleeding but again she did have at least one episode of rectal bleeding which was mentioned in hospital discharge summary from July.    # Diabetes   HISTORY OF PRESENT ILLNESS     Primary Gastroenterologist :  new- .Bogart Cellar, MD   Chief Complaint : bowel changes  YASIRA ENGELSON is a 79 y.o. female with PMH / [place] significant for,  but not necessarily limited to: Hypertension, hyperlipidemia, obesity, diabetes, pulmonary embolism on Eliquis, diverticulosis,  depression, COVID  Patient referred by Pulmonologist for diarrhea which started close to a year ago. Husband and daughter help give history as patient has memory issues after COVID infection. She averages about 4 bowel movements a day. She and husband agree that stools are not loose but rather partially formed. No blood in the stools per patient / husband but hospital discharge summary in July mentions that patient did have an episode of blood in stool. Bowel changes predate any recent medication changes. She takes Imodium, one pill every other night which controls some of the associated urgency / incontinence.  Patient / family says patient recently had a stool test through PCP but they don't know what is was for. In early Sept she had a CT scan of the abdomen and pelvis with contrast remarkable for sigmoid diverticulosis with mildly thickened short segment of rectosigmoid colon.. Underlying colon malignancy could not be excluded.  Patient has never had a colonoscopy  In June of this year patient was hospitalized with extensive RLE DVT and large bilateral pulmonary emboli.  She received thrombolytics and heparin and subsequently transitioned to Eliquis.  Patient was discharged home on 12/26/2019 but returned to the hospital the following day for recurrent shortness of breath.  She was admitted with hypoxemic respiratory failure.  Imaging was notable for similar embolic burden, residual right heart strain, developing pulmonary infarcts .    Data Reviewed:   July 2021 Albumin 2.7, o/w normal TSH normal Hgb 10, MCV 87 Creatinine 1.1  Echo Aug 2021  EF 60-65% Mildly reduced right ventricular systolic function.    03/05/20 CT w/ contrast for N/V and upper abdominal pain Mildly thickened short segment of rectosigmoid colon.    Previous Endoscopic Evaluations / Pertinent Studies:    Past Medical History:  Diagnosis Date   COVID-19    Diabetes mellitus without complication (HCC)     Diverticulosis    GERD (gastroesophageal reflux disease)    HLD (hyperlipidemia)    Hypertension    Pulmonary embolism ([place])      Past Surgical History:  Procedure Laterality Date   TONSILLECTOMY     No family history on file. Social History   Tobacco Use   Smoking status: Never Smoker   Smokeless tobacco: Never Used  Substance Use Topics   Alcohol use: Not Currently   Drug use: Not on file   Current Outpatient Medications  Medication Sig Dispense Refill   amLODipine-valsartan (EXFORGE) 5-320 MG tablet      apixaban (ELIQUIS) 5 MG TABS tablet Please take 10 mg oral twice daily for next 5 days, then transition to 5 mg oral twice daily on 01/04/2020 70 tablet 0   chlorthalidone (HYGROTON) 25 MG tablet Take 12.5 mg by mouth daily.     cholecalciferol (VITAMIN D3) 25 MCG (1000 UNIT) tablet Take 1,000 Units by mouth daily.     escitalopram (LEXAPRO) 10 MG tablet Take 10 mg by mouth daily.     FARXIGA 10 MG TABS tablet Take 10 mg by mouth daily.     loperamide (IMODIUM) 2 MG capsule Take 2 mg by mouth at bedtime.     pantoprazole (PROTONIX) 40 MG tablet Take 1 tablet (40 mg total) by mouth daily. 30 tablet 2   pioglitazone (ACTOS) 30 MG tablet Take 30 mg by mouth daily.     rosuvastatin (CRESTOR) 20 MG tablet Take 20 mg by mouth at bedtime.     vitamin B-12 100 MCG tablet Take 1 tablet (100 mcg total) by mouth daily.     donepezil (ARICEPT) 10 MG tablet Take 10 mg by mouth at bedtime. (Patient not taking: Reported on 04/07/2020)     Probiotic Product (PROBIOTIC PO) Take 1 tablet by mouth daily. (Patient not taking: Reported on 04/07/2020)     No current facility-administered medications for this visit.   Allergies  Allergen Reactions   Predicort [Prednisolone] Other (See Comments)    Per daughter "makes her crazy"   Prednisone     Per daughter "makes her crazy"   Penicillins Rash and Other (See Comments)    Childhood allergy, pt believes she broke out  in her mouth     Review of Systems: Positive for fatigue, confusion, excessive thirst.  All other systems reviewed and negative except where noted in HPI.   PHYSICAL EXAM :    Wt Readings from Last 3 Encounters:  04/07/20 194 lb 6 oz (88.2 kg)  02/20/20 191 lb (86.6 kg)  12/27/19 206 lb 2.1 oz (93.5 kg)    Ht 5' (1.524 m) Comment: height measured without shoes   Wt 194 lb 6 oz (88.2 kg)    BMI 37.96 kg/m  Constitutional:  Pleasant female in no acute distress. Psychiatric: Normal mood and affect. Behavior is normal. EENT: Pupils normal.  Conjunctivae are normal. No scleral icterus. Neck supple.  Cardiovascular: Normal rate, regular rhythm. No edema Pulmonary/chest: Effort normal and breath sounds normal. No wheezing, rales or rhonchi. Abdominal: Soft, nondistended, nontender. Bowel sounds active throughout. There are no masses palpable. No hepatomegaly.  Neurological: Alert and oriented to person place and time. Skin: Skin is warm and dry. No rashes noted.  Tye Savoy, NP  04/07/2020, 1:39 PM  Cc:  Referring Provider Freddi Starr, MD

## 2020-04-08 NOTE — Telephone Encounter (Signed)
Patient should be taken of eliquis 48 hours prior to the procedure.

## 2020-04-08 NOTE — Progress Notes (Signed)
Agree with assessment and plan as outlined. If patient can be cleared to hold anticoagulation can proceed with colonoscopy which is recommended. If she needs more time to recover from the PE and can't hold anticoagulation yet that is okay, hopefully pulmonary can clarify if and when she is able to hold anticoagulation.

## 2020-04-09 DIAGNOSIS — E1165 Type 2 diabetes mellitus with hyperglycemia: Secondary | ICD-10-CM | POA: Diagnosis not present

## 2020-04-09 DIAGNOSIS — N3001 Acute cystitis with hematuria: Secondary | ICD-10-CM | POA: Diagnosis not present

## 2020-04-09 DIAGNOSIS — I2699 Other pulmonary embolism without acute cor pulmonale: Secondary | ICD-10-CM | POA: Diagnosis not present

## 2020-04-09 DIAGNOSIS — F0150 Vascular dementia without behavioral disturbance: Secondary | ICD-10-CM | POA: Diagnosis not present

## 2020-04-09 DIAGNOSIS — Z6835 Body mass index (BMI) 35.0-35.9, adult: Secondary | ICD-10-CM | POA: Diagnosis not present

## 2020-04-16 NOTE — Telephone Encounter (Signed)
Lm on vm 

## 2020-04-24 DIAGNOSIS — K219 Gastro-esophageal reflux disease without esophagitis: Secondary | ICD-10-CM | POA: Diagnosis not present

## 2020-04-24 DIAGNOSIS — I10 Essential (primary) hypertension: Secondary | ICD-10-CM | POA: Diagnosis not present

## 2020-04-24 DIAGNOSIS — E1165 Type 2 diabetes mellitus with hyperglycemia: Secondary | ICD-10-CM | POA: Diagnosis not present

## 2020-04-28 NOTE — Telephone Encounter (Signed)
Lm regarding Eliquis

## 2020-04-29 DIAGNOSIS — J22 Unspecified acute lower respiratory infection: Secondary | ICD-10-CM | POA: Diagnosis not present

## 2020-04-29 DIAGNOSIS — J101 Influenza due to other identified influenza virus with other respiratory manifestations: Secondary | ICD-10-CM | POA: Diagnosis not present

## 2020-05-04 ENCOUNTER — Telehealth: Payer: Self-pay | Admitting: Pulmonary Disease

## 2020-05-04 NOTE — Telephone Encounter (Signed)
Spoke with daughter Sharyn Lull (dpr on file), states that pt takes eliquis and was stopping the med 48 hours prior to an upcoming procedure with GI.  Daughter wanted to make sure that we gave permission for pt to temporarily d/c this medication.  Per 04/07/20 phone note, JD said:  April 08, 2020 Freddi Starr, MD     2:50 PM Note Patient should be taken of eliquis 48 hours prior to the procedure.      I made daughter aware of these recs.  Nothing further needed at this time- will close encounter.

## 2020-05-05 NOTE — Telephone Encounter (Signed)
Lm regarding Eliquis

## 2020-05-06 ENCOUNTER — Encounter: Payer: Self-pay | Admitting: Gastroenterology

## 2020-05-06 ENCOUNTER — Ambulatory Visit (AMBULATORY_SURGERY_CENTER): Payer: Medicare HMO | Admitting: Gastroenterology

## 2020-05-06 ENCOUNTER — Other Ambulatory Visit: Payer: Self-pay

## 2020-05-06 VITALS — BP 140/67 | HR 78 | Temp 96.9°F | Resp 15 | Ht 61.0 in | Wt 194.0 lb

## 2020-05-06 DIAGNOSIS — D125 Benign neoplasm of sigmoid colon: Secondary | ICD-10-CM

## 2020-05-06 DIAGNOSIS — K648 Other hemorrhoids: Secondary | ICD-10-CM | POA: Diagnosis not present

## 2020-05-06 DIAGNOSIS — D124 Benign neoplasm of descending colon: Secondary | ICD-10-CM

## 2020-05-06 DIAGNOSIS — R194 Change in bowel habit: Secondary | ICD-10-CM

## 2020-05-06 DIAGNOSIS — R935 Abnormal findings on diagnostic imaging of other abdominal regions, including retroperitoneum: Secondary | ICD-10-CM

## 2020-05-06 DIAGNOSIS — K635 Polyp of colon: Secondary | ICD-10-CM | POA: Diagnosis not present

## 2020-05-06 MED ORDER — SODIUM CHLORIDE 0.9 % IV SOLN: 500.0000 mL | Freq: Once | INTRAVENOUS | Status: DC

## 2020-05-06 NOTE — Op Note (Signed)
[facility] Patient Name: Dawn Peterson Procedure Date: 05/06/2020 2:37 PM MRN: [id number] Endoscopist: Remo Lipps P. Havery Moros , MD Age: 79 Referring MD:  Date of Birth: December 23, 1940 Gender: Female Account #: [id number] Procedure:                Colonoscopy Indications:              Abnormal CT of the GI tract, Change in bowel habits Medicines:                Monitored Anesthesia Care Procedure:                Pre-Anesthesia Assessment:                           - Prior to the procedure, a History and Physical                            was performed, and patient medications and                            allergies were reviewed. The patient's tolerance of                            previous anesthesia was also reviewed. The risks                            and benefits of the procedure and the sedation                            options and risks were discussed with the patient.                            All questions were answered, and informed consent                            was obtained. Prior Anticoagulants: The patient has                            taken Eliquis (apixaban), last dose was 2 days                            prior to procedure. ASA Grade Assessment: III - A                            patient with severe systemic disease. After                            reviewing the risks and benefits, the patient was                            deemed in satisfactory condition to undergo the                            procedure.  After obtaining informed consent, the colonoscope                            was passed under direct vision. Throughout the                            procedure, the patient's blood pressure, pulse, and                            oxygen saturations were monitored continuously. The                            Colonoscope was introduced through the anus and                            advanced to the the terminal ileum,  with                            identification of the appendiceal orifice and IC                            valve. The colonoscopy was performed without                            difficulty. The patient tolerated the procedure                            well. The quality of the bowel preparation was                            good. The terminal ileum, ileocecal valve,                            appendiceal orifice, and rectum were photographed. Scope In: 2:50:37 PM Scope Out: 3:21:40 PM Scope Withdrawal Time: 0 hours 19 minutes 43 seconds  Total Procedure Duration: 0 hours 31 minutes 3 seconds  Findings:                 The perianal and digital rectal examinations were                            normal.                           The terminal ileum appeared normal.                           Three sessile polyps were found in the descending                            colon. The polyps were 3 to 4 mm in size. These                            polyps were removed with a cold snare (2) and one  with forceps. Resection and retrieval were complete.                           A 5 mm polyp was found in the sigmoid colon. The                            polyp was sessile. The polyp was removed with a                            cold snare. Resection and retrieval were complete.                           Multiple medium-mouthed diverticula were found in                            the recto-sigmoid colon and sigmoid colon. This                            area was difficult to evaluate, angulated /                            narrowed lumen due to diverticular disease. No                            inflammatory changes or mass lesions, etc.                           Internal hemorrhoids were found during                            retroflexion. The hemorrhoids were small.                           There was spasm in the entire colon which prolonged                             withdrawal.                           The exam was otherwise without abnormality.                           Biopsies for histology were taken with a cold                            forceps from the right colon, left colon and                            transverse colon for evaluation of microscopic                            colitis. Complications:            No immediate complications. Estimated blood loss:  Minimal. Estimated Blood Loss:     Estimated blood loss was minimal. Impression:               - The examined portion of the ileum was normal.                           - Three 3 to 4 mm polyps in the descending colon,                            removed with a cold snare. Resected and retrieved.                           - One 5 mm polyp in the sigmoid colon, removed with                            a cold snare. Resected and retrieved.                           - Diverticulosis in the recto-sigmoid colon and in                            the sigmoid colon.                           - Internal hemorrhoids.                           - Colonic spasm.                           - The examination was otherwise normal.                           - Biopsies were taken with a cold forceps from the                            right colon, left colon and transverse colon for                            evaluation of microscopic colitis. Recommendation:           - Patient has a contact number available for                            emergencies. The signs and symptoms of potential                            delayed complications were discussed with the                            patient. Return to normal activities tomorrow.                            Written discharge instructions were provided to the  patient.                           - Resume previous diet.                           - Continue present medications.                            - Resume Eliquis tomorrow                           - Await pathology results. Remo Lipps P. Dayannara Pascal, MD 05/06/2020 3:27:15 PM This report has been signed electronically.

## 2020-05-06 NOTE — Progress Notes (Signed)
Pt Drowsy. VSS. To PACU, report to RN. No anesthetic complications noted.  

## 2020-05-06 NOTE — Patient Instructions (Addendum)
Read all of the handouts given to you by your recovery room nurse.  Thank-you for choosing [place] for your healthcare needs today.  YOU HAD AN ENDOSCOPIC PROCEDURE TODAY AT [facility]:   Refer to the procedure report that was given to you for any specific questions about what was found during the examination.  If the procedure report does not answer your questions, please call your gastroenterologist to clarify.  If you requested that your care partner not be given the details of your procedure findings, then the procedure report has been included in a sealed envelope for you to review at your convenience later.  YOU SHOULD EXPECT: Some feelings of bloating in the abdomen. Passage of more gas than usual.  Walking can help get rid of the air that was put into your GI tract during the procedure and reduce the bloating. If you had a lower endoscopy (such as a colonoscopy or flexible sigmoidoscopy) you may notice spotting of blood in your stool or on the toilet paper. If you underwent a bowel prep for your procedure, you may not have a normal bowel movement for a few days.  Please Note:  You might notice some irritation and congestion in your nose or some drainage.  This is from the oxygen used during your procedure.  There is no need for concern and it should clear up in a day or so.  SYMPTOMS TO REPORT IMMEDIATELY:   Following lower endoscopy (colonoscopy or flexible sigmoidoscopy):  Excessive amounts of blood in the stool  Significant tenderness or worsening of abdominal pains  Swelling of the abdomen that is new, acute  Fever of 100F or higher   For urgent or emergent issues, a gastroenterologist can be reached at any hour by calling [phone]. Do not use MyChart messaging for urgent concerns.    DIET:  We do recommend a small meal at first, but then you may proceed to your regular diet.  Drink plenty of fluids but you should avoid alcoholic beverages for 24  hours.  ACTIVITY:  You should plan to take it easy for the rest of today and you should NOT DRIVE or use heavy machinery until tomorrow (because of the sedation medicines used during the test).    FOLLOW UP: Our staff will call the number listed on your records 48-72 hours following your procedure to check on you and address any questions or concerns that you may have regarding the information given to you following your procedure. If we do not reach you, we will leave a message.  We will attempt to reach you two times.  During this call, we will ask if you have developed any symptoms of COVID 19. If you develop any symptoms (ie: fever, flu-like symptoms, shortness of breath, cough etc.) before then, please call [phone].  If you test positive for Covid 19 in the 2 weeks post procedure, please call and report this information to [place].    If any biopsies were taken you will be contacted by phone or by letter within the next 1-3 weeks.  Please call us at [phone] if you have not heard about the biopsies in 3 weeks.  Resume your eliquis tomorrow.  SIGNATURES/CONFIDENTIALITY: You and/or your care partner have signed paperwork which will be entered into your electronic medical record.  These signatures attest to the fact that that the information above on your After Visit Summary has been reviewed and is understood.  Full responsibility of the confidentiality of  this discharge information lies with you and/or your care-partner.

## 2020-05-06 NOTE — Progress Notes (Signed)
Called to room to assist during endoscopic procedure.  Patient ID and intended procedure confirmed with present staff. Received instructions for my participation in the procedure from the performing physician.  

## 2020-05-06 NOTE — Progress Notes (Signed)
VS by SP

## 2020-05-10 ENCOUNTER — Telehealth: Payer: Self-pay

## 2020-05-10 ENCOUNTER — Telehealth: Payer: Self-pay | Admitting: *Deleted

## 2020-05-10 NOTE — Telephone Encounter (Signed)
Called [phone] and left a message we tried to reach pt for a follow up call. maw

## 2020-05-10 NOTE — Telephone Encounter (Signed)
  Follow up Call-  Call back number 05/06/2020  Post procedure Call Back phone  # [phone]  Permission to leave phone message Yes  Some recent data might be hidden     Patient questions:  Do you have a fever, pain , or abdominal swelling? No. Pain Score  0 *  Have you tolerated food without any problems? Yes.    Have you been able to return to your normal activities? Yes.    Do you have any questions about your discharge instructions: Diet   No. Medications  No. Follow up visit  No.  Do you have questions or concerns about your Care? No.  Actions: * If pain score is 4 or above: No action needed, pain <4.  1. Have you developed a fever since your procedure? no  2.   Have you had an respiratory symptoms (SOB or cough) since your procedure? no  3.   Have you tested positive for COVID 19 since your procedure no  4.   Have you had any family members/close contacts diagnosed with the COVID 19 since your procedure?  no   If yes to any of these questions please route to Joylene John, RN and Joella Prince, RN

## 2020-05-25 DIAGNOSIS — I10 Essential (primary) hypertension: Secondary | ICD-10-CM | POA: Diagnosis not present

## 2020-05-25 DIAGNOSIS — E1165 Type 2 diabetes mellitus with hyperglycemia: Secondary | ICD-10-CM | POA: Diagnosis not present

## 2020-05-25 DIAGNOSIS — K219 Gastro-esophageal reflux disease without esophagitis: Secondary | ICD-10-CM | POA: Diagnosis not present

## 2020-06-09 DIAGNOSIS — E7800 Pure hypercholesterolemia, unspecified: Secondary | ICD-10-CM | POA: Diagnosis not present

## 2020-06-09 DIAGNOSIS — Z0000 Encounter for general adult medical examination without abnormal findings: Secondary | ICD-10-CM | POA: Diagnosis not present

## 2020-06-09 DIAGNOSIS — N183 Chronic kidney disease, stage 3 unspecified: Secondary | ICD-10-CM | POA: Diagnosis not present

## 2020-06-09 DIAGNOSIS — F0150 Vascular dementia without behavioral disturbance: Secondary | ICD-10-CM | POA: Diagnosis not present

## 2020-06-09 DIAGNOSIS — Z6835 Body mass index (BMI) 35.0-35.9, adult: Secondary | ICD-10-CM | POA: Diagnosis not present

## 2020-06-09 DIAGNOSIS — E1122 Type 2 diabetes mellitus with diabetic chronic kidney disease: Secondary | ICD-10-CM | POA: Diagnosis not present

## 2020-06-09 DIAGNOSIS — I2699 Other pulmonary embolism without acute cor pulmonale: Secondary | ICD-10-CM | POA: Diagnosis not present

## 2020-06-09 DIAGNOSIS — E1143 Type 2 diabetes mellitus with diabetic autonomic (poly)neuropathy: Secondary | ICD-10-CM | POA: Diagnosis not present

## 2020-06-09 DIAGNOSIS — K3184 Gastroparesis: Secondary | ICD-10-CM | POA: Diagnosis not present

## 2020-06-21 NOTE — Telephone Encounter (Signed)
06/21/20  I am sorry to hear the patient is having the symptoms.  Unfortunately this will not completely addressed by simply stopping the Eliquis.  Would recommend patient have in person physical exam in the emergency room to further evaluate.  Okay to hold Eliquis until physical exam can happen today in the emergency room.  Please also CC Dr. Francine Graven on this message just an FYI as this is this patient.  Please ensure patient has upcoming appointment with Dr. Francine Graven within the next 4 to 6 weeks.  Elisha Headland, FNP

## 2020-06-21 NOTE — Telephone Encounter (Signed)
Called and spoke with patient's daughter Marcelino Duster. She verbalized understanding. I explained to her why this issue could not be addressed in our office. She verbalized understanding. She will take her mother to the ED today. I advised her to keep [place] posted.

## 2020-06-21 NOTE — Telephone Encounter (Signed)
Called and spoke with patient's daughter Ocie Doyne. She stated that her mother has had at least 3-4 menstrual bleeding episodes while on Eliquis. The current episode started on Christmas Day and the bleeding has not slowed down. She denied any increased fatigue or any other symptoms. From her knowledge, her mother has not had a hysterectomy. She wants to know if the Eliquis can be discontinued. I advised her that Dr. Erin Fulling is not in the office today but I would send a message to one of our nurse practitioners. She verbalized understanding.   Aaron Edelman, can you please advise? Thanks!

## 2020-06-22 ENCOUNTER — Telehealth: Payer: Self-pay | Admitting: Pulmonary Disease

## 2020-06-22 NOTE — Telephone Encounter (Signed)
Left message for Marcelino Duster to call back. Message was addressed yesterday.

## 2020-06-23 ENCOUNTER — Telehealth: Payer: Self-pay | Admitting: Pulmonary Disease

## 2020-06-23 NOTE — Telephone Encounter (Signed)
Left detailed message for Marcelino Duster about the recs from pt's email from 12.27.21 and to call back if anything further is needed.

## 2020-06-23 NOTE — Telephone Encounter (Signed)
Pt's daughter called concerned about increase in vaginal bleeding of pt who was started on Eliquis by Dr. Francine Graven. Pt's daughter wanted to schedule OV to review with Dr. Francine Graven which was scheduled on 07/07/19. Nothing further needed at this time.

## 2020-06-24 DIAGNOSIS — I10 Essential (primary) hypertension: Secondary | ICD-10-CM | POA: Diagnosis not present

## 2020-06-24 DIAGNOSIS — E1165 Type 2 diabetes mellitus with hyperglycemia: Secondary | ICD-10-CM | POA: Diagnosis not present

## 2020-06-24 DIAGNOSIS — G4700 Insomnia, unspecified: Secondary | ICD-10-CM | POA: Diagnosis not present

## 2020-06-24 DIAGNOSIS — Z20822 Contact with and (suspected) exposure to covid-19: Secondary | ICD-10-CM | POA: Diagnosis not present

## 2020-06-28 ENCOUNTER — Telehealth: Payer: Self-pay | Admitting: Pulmonary Disease

## 2020-06-28 NOTE — Telephone Encounter (Signed)
Spoke with daughter (dpr on file), states that they are planning on taking a roadtrip later this month- approx 500 miles one way.  Daughter is asking how often they need to stop to walk around and stretch legs, if patient should wear compression stockings, and if there are any other recommendations JD has for pt. Daughter notes that they are traveling in a [place] with lots of room for stretching legs.  JD please advise, thanks!

## 2020-06-29 NOTE — Telephone Encounter (Signed)
Patient is scheduled for follow up with me on 07/06/2020. I can discuss recommendations at that time if that is ok with them.   Thanks, Dawn Peterson

## 2020-06-30 ENCOUNTER — Telehealth: Payer: Self-pay | Admitting: Pulmonary Disease

## 2020-06-30 DIAGNOSIS — N939 Abnormal uterine and vaginal bleeding, unspecified: Secondary | ICD-10-CM

## 2020-06-30 NOTE — Telephone Encounter (Signed)
Referral for OB/GYN has been placed. Appt for next week has been cancelled.

## 2020-06-30 NOTE — Telephone Encounter (Signed)
I spoke with patient's daughter on the phone today. Her mother has been having vaginal/uterine bleeding over recent months, 1-2 days per month using 1-2 pads each day since starting the eliquis. I informed them it is not normal that she have any vaginal/uterine bleeding. She had a vaginal exam by her primary care doctor which did not reveal any abnormalities. I recommended a referral to OB/GYN which she requested we place. I told them to continue the eliquis for now since the bleeding is not recurrent/heavy enough at this time. Should the amount of bleeding change, then they should contact us.   Please place referral to OB/GYN for abnormal uterine bleeding.  Thanks, Cletis Athens

## 2020-06-30 NOTE — Telephone Encounter (Signed)
Called and spoke to daughter , advised will discuss in detail at [facility]  Gave suggestions for frequent stops and walking around, breaking up trip to 1/2 time if option .  Will follow up as planned next week with Dr. Francine Graven. Nothing further needed at this time .

## 2020-07-06 ENCOUNTER — Ambulatory Visit: Payer: Medicare HMO | Admitting: Pulmonary Disease

## 2020-07-13 ENCOUNTER — Ambulatory Visit: Payer: Medicare HMO | Admitting: Gastroenterology

## 2020-07-22 ENCOUNTER — Ambulatory Visit: Payer: Medicare HMO | Admitting: Gastroenterology

## 2020-07-22 ENCOUNTER — Other Ambulatory Visit: Payer: Medicare HMO

## 2020-07-22 ENCOUNTER — Encounter: Payer: Self-pay | Admitting: Gastroenterology

## 2020-07-22 ENCOUNTER — Other Ambulatory Visit: Payer: Self-pay

## 2020-07-22 VITALS — BP 108/60 | HR 89 | Ht 63.0 in | Wt 196.0 lb

## 2020-07-22 DIAGNOSIS — R109 Unspecified abdominal pain: Secondary | ICD-10-CM | POA: Diagnosis not present

## 2020-07-22 DIAGNOSIS — K529 Noninfective gastroenteritis and colitis, unspecified: Secondary | ICD-10-CM

## 2020-07-22 DIAGNOSIS — R932 Abnormal findings on diagnostic imaging of liver and biliary tract: Secondary | ICD-10-CM

## 2020-07-22 MED ORDER — IBGARD 90 MG PO CPCR: ORAL_CAPSULE | ORAL | 0 refills | Status: AC

## 2020-07-22 MED ORDER — DICYCLOMINE HCL 10 MG PO CAPS: 10.0000 mg | ORAL_CAPSULE | Freq: Three times a day (TID) | ORAL | 1 refills | Status: AC | PRN

## 2020-07-22 MED ORDER — COLESTIPOL HCL 1 G PO TABS: 1.0000 g | ORAL_TABLET | Freq: Two times a day (BID) | ORAL | 0 refills | Status: AC

## 2020-07-22 NOTE — Patient Instructions (Addendum)
If you are age 78 or older, your body mass index should be between 23-30. Your Body mass index is 34.72 kg/m. If this is out of the aforementioned range listed, please consider follow up with your Primary Care Provider.  If you are age 80 or younger, your body mass index should be between 19-25. Your Body mass index is 34.72 kg/m. If this is out of the aformentioned range listed, please consider follow up with your Primary Care Provider.   Please go to the lab in the basement of our building to have lab work done as you leave today. Hit "B" for basement when you get on the elevator.  When the doors open the lab is on your left.  We will call you with the results. Thank you.  We have sent the following medications to your pharmacy for you to pick up at your convenience: Colestid 1 g: Take twice a day for 1 week.  If not helpful you can take Imodium over-the-counter every day and titrate as needed  Bentyl 10 mg: Take every 8 hours as needed  We have given you samples of the following medication to take: IBgard: Take as directed.  If helpful we are giving you a coupon and you take purchase more over-the-counter   Thank you for entrusting me with your care and for choosing [facility], Dr. Ithaca Cellar

## 2020-07-22 NOTE — Progress Notes (Signed)
HPI :  80 year old female here for a follow-up visit for altered bowel habits.  She is accompanied by her daughter today.  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  About for the past year she has developed bowel habit changes, with usually at least a few bowel movements a day, perhaps more, which can be loose and explosive.  She is having fecal incontinence episodes to some extent about 2 to 3 days/week.  She had a CT scan in the past to evaluate this noted to have some rectosigmoid thickening.  She had never had a colonoscopy before and underwent a colonoscopy with me.  She had a few benign polyps removed but no overt inflammation on her exam.  She had biopsies that tested negative for microscopic colitis.  She is here to discuss long-term options.  She states she does have some dietary triggers, specifically sweet foods can produce a bowel movement more urgently.  That being said daughter states eating almost anything can produced a bowel movement and accidents are sporadic and unpredictable.  She did do some traveling with family and had been taking Imodium once daily during that time and states she actually did pretty well with that.  She does have lower abdominal cramping in her suprapubic to lower abdomen area which is reliably relieved with a bowel movement.  There is no blood in her stool.  Her gallbladder remains in place.  She denies any family history of IBD or celiac disease.  She has been on Eliquis since last July and she has been on [place] for management of her DM.  They deny any routine NSAID use.  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  She denies any right upper quadrant pain or postprandial upper abdominal discomfort.  Echo Aug 2021  EF 60-65% Mildly reduced right ventricular systolic function.  03/05/20 CT w/ contrast for N/V and upper abdominal pain Mildly thickened short  segment of rectosigmoid colon.   Colonoscopy 05/06/20 - The examined portion of the ileum was normal. - Three 3 to 4 mm polyps in the descending colon, removed with a cold snare. Resected and retrieved. - One 5 mm polyp in the sigmoid colon, removed with a cold snare. Resected and retrieved. - Diverticulosis in the recto-sigmoid colon and in the sigmoid colon. - Internal hemorrhoids. - Colonic spasm. - The examination was otherwise normal. - Biopsies were taken with a cold forceps from the right colon, left colon and transverse colon for evaluation of microscopic colitis.  1. Surgical [P], colon, sigmoid, descending, polyp (4) - TUBULAR ADENOMA. - NO HIGH GRADE DYSPLASIA OR CARCINOMA. - LEIOMYOMA. - INFLAMMATORY POLYP. 2. Surgical [P], colon, random sites - UNREMARKABLE COLONIC MUCOSA. - NO MICROSCOPIC COLITIS, ACTIVE INFLAMMATION OR CHRONIC CHANGES.   CT abdomen / pelvis with contrast - 03/05/20: IMPRESSION: 1. No acute abdominopelvic findings. 2. Sigmoid diverticulosis with mildly thickened short segment of rectosigmoid colon, which may reflect sequela of chronic diverticulitis. No evidence of active colonic inflammation. Underlying colonic malignancy at this location would be difficult to exclude. Consider correlation with direct visualization, if colonoscopy not recently performed. 3. Suspected mural calcifications within the gallbladder wall with a partial porcelain gallbladder appearance, similar in appearance to the previous study. No specific follow-up recommendations for this finding. Reference: J Am Coll Radiol 2013;10:953-956. 4. Rounded non-expansile lucent lesion within the L3 vertebral body measuring 2.2 cm, possibly  representing a hemangioma. Correlation with prior outside imaging of the spine is suggested to assess for interval change. 5. Aortic atherosclerosis. (ICD10-I70.0).    Past Medical History:  Diagnosis Date  . Aortic atherosclerosis ([place])   .  COVID-19   . Diabetes mellitus without complication ([place])   . Diverticulosis   . GERD (gastroesophageal reflux disease)   . HLD (hyperlipidemia)   . Hypertension   . Pulmonary embolism [facility])      Past Surgical History:  Procedure Laterality Date  . TONSILLECTOMY     Family History  Problem Relation Age of Onset  . Hypertension Mother   . Congestive Heart Failure Mother   . Diabetes Mother   . Hypertension Maternal Grandmother   . Emphysema Maternal Aunt   . Colon cancer Neg Hx   . Esophageal cancer Neg Hx   . Rectal cancer Neg Hx   . Stomach cancer Neg Hx    Social History   Tobacco Use  . Smoking status: Never Smoker  . Smokeless tobacco: Never Used  Vaping Use  . Vaping Use: Never used  Substance Use Topics  . Alcohol use: Not Currently  . Drug use: Never   Current Outpatient Medications  Medication Sig Dispense Refill  . Accu-Chek FastClix Lancets MISC     . ACCU-CHEK SMARTVIEW test strip     . amLODipine-valsartan (EXFORGE) 5-320 MG tablet     . apixaban (ELIQUIS) 5 MG TABS tablet Please take 10 mg oral twice daily for next 5 days, then transition to 5 mg oral twice daily on 01/04/2020 70 tablet 0  . chlorthalidone (HYGROTON) 25 MG tablet Take 12.5 mg by mouth daily.    . cholecalciferol (VITAMIN D3) 25 MCG (1000 UNIT) tablet Take 1,000 Units by mouth daily.    . colestipol (COLESTID) 1 g tablet Take 1 tablet (1 g total) by mouth 2 (two) times daily. 60 tablet 0  . dicyclomine (BENTYL) 10 MG capsule Take 1 capsule (10 mg total) by mouth every 8 (eight) hours as needed for spasms. 30 capsule 1  . donepezil (ARICEPT) 10 MG tablet Take 10 mg by mouth at bedtime.    Marland Kitchen escitalopram (LEXAPRO) 10 MG tablet Take 10 mg by mouth daily.    Marland Kitchen FARXIGA 10 MG TABS tablet Take 10 mg by mouth daily.    Marland Kitchen loperamide (IMODIUM) 2 MG capsule Take 2 mg by mouth at bedtime.    . pantoprazole (PROTONIX) 40 MG tablet Take 1 tablet (40 mg total) by mouth daily. 30 tablet 2  . Peppermint  Oil (IBGARD) 90 MG CPCR Use as directed 12 capsule 0  . pioglitazone (ACTOS) 30 MG tablet Take 30 mg by mouth daily.    . Probiotic Product (PROBIOTIC PO) Take 1 tablet by mouth daily.    . rosuvastatin (CRESTOR) 20 MG tablet Take 20 mg by mouth at bedtime.    . vitamin B-12 100 MCG tablet Take 1 tablet (100 mcg total) by mouth daily.     No current facility-administered medications for this visit.   Allergies  Allergen Reactions  . Predicort [Prednisolone] Other (See Comments)    Per daughter "makes her crazy"  . Prednisone     Per daughter "makes her crazy"  . Penicillins Rash and Other (See Comments)    Childhood allergy, pt believes she broke out in her mouth     Review of Systems: All systems reviewed and negative except where noted in HPI.   Lab Results  Component Value  Date   ALT 15 12/30/2019   AST 21 12/30/2019   ALKPHOS 44 12/30/2019   BILITOT 0.9 12/30/2019    Lab Results  Component Value Date   CREATININE 1.10 (H) 12/30/2019   BUN 16 12/30/2019   NA 137 12/30/2019   K 3.8 12/30/2019   CL 102 12/30/2019   CO2 26 12/30/2019     Lab Results  Component Value Date   ALT 15 12/30/2019   AST 21 12/30/2019   ALKPHOS 44 12/30/2019   BILITOT 0.9 12/30/2019     Physical Exam: BP 108/60   Pulse 89   Ht 5\' 3"  (1.6 m)   Wt 196 lb (88.9 kg)   BMI 34.72 kg/m  Constitutional: Pleasant,well-developed, female in no acute distress. Neurological: Alert and oriented to person place and time. Psychiatric: Normal mood and affect. Behavior is normal.   ASSESSMENT AND PLAN: 80 year old female here for reassessment of the following:  Chronic diarrhea / Abdominal cramping - persistent loose stools at this point in time with associated abdominal cramping which is relieved with a bowel movement.  She does have some episodic fecal incontinence.  I reassured them that I do not see any evidence of Crohn's disease or obvious colitis.  I do not see any obvious medicines she  is on that would be causing her symptoms either. Fargixa time course lines up with onset of symptoms but diarrhea is not a typical side effect from this.  We discussed options.  She does respond to Imodium and certainly safe to take that daily and titrate up or down as needed. She would prefer to use something else if possible,  they inquired about other options, we discussed empiric Colestid as this can often help with postprandial loose stools.  Discussed risks and benefits of that.  Discussed that Colestid can increase risk for gallstones slightly.  They wanted to try Colestid a few weeks or so to see if this will help her, they can contact me to let me know her response.  If she does not like the way it makes her feel or does not work she can always go to use Imodium and I think that is certainly a fine option. Otherwise will send serologies for celiac disease to ensure negative. For her bowel spasm will try some low dose bentyl to use PRN, also provided them a free sample of IB gard and can also use that as needed. They can follow up as needed for this issue or contact me with any questions.   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  No obvious evidence of gallbladder malignancy.  I counseled them that this can increase the risk of gallbladder cancer, incidence is roughly 2 to 3%.  We discussed this.  She has no symptoms from this at all.  Counseled her that if she was younger or symptomatic would recommend cholecystectomy however at her age with comorbidities this is hopefully unlikely to bother her moving forward although they understand the risks, they would prefer to hold off on cholecystectomy evaluation which I think is reasonable.  I will continue to monitor for symptoms.  She agreed  [place]urel [place] Cellar, MD [facility]

## 2020-07-23 LAB — IGA: Immunoglobulin A: 353 mg/dL — ABNORMAL HIGH (ref 70–320)

## 2020-07-23 LAB — TISSUE TRANSGLUTAMINASE, IGA: (tTG) Ab, IgA: <1 U/mL

## 2020-07-29 ENCOUNTER — Ambulatory Visit: Payer: Medicare HMO | Admitting: Pulmonary Disease

## 2020-07-29 ENCOUNTER — Encounter: Payer: Self-pay | Admitting: Pulmonary Disease

## 2020-07-29 ENCOUNTER — Other Ambulatory Visit: Payer: Self-pay

## 2020-07-29 VITALS — BP 116/74 | HR 75 | Temp 98.2°F | Ht 63.0 in | Wt 198.2 lb

## 2020-07-29 DIAGNOSIS — I2699 Other pulmonary embolism without acute cor pulmonale: Secondary | ICD-10-CM

## 2020-07-29 DIAGNOSIS — N939 Abnormal uterine and vaginal bleeding, unspecified: Secondary | ICD-10-CM

## 2020-07-29 NOTE — Patient Instructions (Signed)
Continue eliquis therapy  We will schedule you for an ultrasound of your heart in [place]  We will refer you to OB/GYN for further evaluation

## 2020-07-29 NOTE — Progress Notes (Signed)
Synopsis: Referred in August 2020 for DVT/PE  Subjective:   PATIENT ID: Dawn Peterson GENDER: female DOB: 1941-05-18, MRN: [id number]   HPI  Chief Complaint  Patient presents with  . Follow-up    F/U on vaginal bleeding. Has noticed an increase in bleeding since last month. Referral was placed last month but they have not heard anything about an appt/    Dawn Peterson is a 80 year old woman, non-smoker with history of diabetes mellitus type II and hypertension who was admitted for pulmonary emboli and DVT on 12/22/19 and 12/26/19, who returns to pulmonary clinic for follow up.   She was last seen in clinic on 02/20/20. She has done well since this visit. Her daughter called in at the end of December complaining that she was having vaginal bleeding. She started to have bleeding for a few days a month, mainly spotting. They report the bleeding is happening about 2 weeks per month now and still described as spotting. A referral was made to OB/GYN in early January 2022 but they never heard from the OB/GYN office.   She denies issues with bleeding from any other site. She denies chest pain or shortness of breath at this time. She continues to remain fairly sedentary throughout the day.    Past Medical History:  Diagnosis Date  . Aortic atherosclerosis ([place])   . COVID-19   . Diabetes mellitus without complication ([place])   . Diverticulosis   . GERD (gastroesophageal reflux disease)   . HLD (hyperlipidemia)   . Hypertension   . Pulmonary embolism ([facility])      Family History  Problem Relation Age of Onset  . Hypertension Mother   . Congestive Heart Failure Mother   . Diabetes Mother   . Hypertension Maternal Grandmother   . Emphysema Maternal Aunt   . Colon cancer Neg Hx   . Esophageal cancer Neg Hx   . Rectal cancer Neg Hx   . Stomach cancer Neg Hx      Social History   Socioeconomic History  . Marital status: Married    Spouse name: Not on file  . Number of children: 3  .  Years of education: Not on file  . Highest education level: Not on file  Occupational History  . Occupation: retired  Tobacco Use  . Smoking status: Never Smoker  . Smokeless tobacco: Never Used  Vaping Use  . Vaping Use: Never used  Substance and Sexual Activity  . Alcohol use: Not Currently  . Drug use: Never  . Sexual activity: Not on file  Other Topics Concern  . Not on file  Social History Narrative  . Not on file   Social Determinants of Health   Financial Resource Strain: Not on file  Food Insecurity: Not on file  Transportation Needs: Not on file  Physical Activity: Not on file  Stress: Not on file  Social Connections: Not on file  Intimate Partner Violence: Not on file     Allergies  Allergen Reactions  . Predicort [Prednisolone] Other (See Comments)    Per daughter "makes her crazy"  . Prednisone     Per daughter "makes her crazy"  . Penicillins Rash and Other (See Comments)    Childhood allergy, pt believes she broke out in her mouth     Outpatient Medications Prior to Visit  Medication Sig Dispense Refill  . Accu-Chek FastClix Lancets MISC     . ACCU-CHEK SMARTVIEW test strip     . amLODipine-valsartan (  EXFORGE) 5-320 MG tablet     . apixaban (ELIQUIS) 5 MG TABS tablet Please take 10 mg oral twice daily for next 5 days, then transition to 5 mg oral twice daily on 01/04/2020 70 tablet 0  . chlorthalidone (HYGROTON) 25 MG tablet Take 12.5 mg by mouth daily.    . cholecalciferol (VITAMIN D3) 25 MCG (1000 UNIT) tablet Take 1,000 Units by mouth daily.    . colestipol (COLESTID) 1 g tablet Take 1 tablet (1 g total) by mouth 2 (two) times daily. 60 tablet 0  . dicyclomine (BENTYL) 10 MG capsule Take 1 capsule (10 mg total) by mouth every 8 (eight) hours as needed for spasms. 30 capsule 1  . donepezil (ARICEPT) 10 MG tablet Take 10 mg by mouth at bedtime.    Marland Kitchen escitalopram (LEXAPRO) 10 MG tablet Take 10 mg by mouth daily.    Marland Kitchen FARXIGA 10 MG TABS tablet Take 10 mg  by mouth daily.    Marland Kitchen loperamide (IMODIUM) 2 MG capsule Take 2 mg by mouth at bedtime.    . pantoprazole (PROTONIX) 40 MG tablet Take 1 tablet (40 mg total) by mouth daily. 30 tablet 2  . Peppermint Oil (IBGARD) 90 MG CPCR Use as directed 12 capsule 0  . pioglitazone (ACTOS) 30 MG tablet Take 30 mg by mouth daily.    . Probiotic Product (PROBIOTIC PO) Take 1 tablet by mouth daily.    . rosuvastatin (CRESTOR) 20 MG tablet Take 20 mg by mouth at bedtime.    . vitamin B-12 100 MCG tablet Take 1 tablet (100 mcg total) by mouth daily.     No facility-administered medications prior to visit.    Review of Systems  Constitutional: Negative for chills, fever, malaise/fatigue and weight loss.  HENT: Negative for congestion, sinus pain and sore throat.   Eyes: Negative.   Respiratory: Negative for cough, hemoptysis, sputum production, shortness of breath and wheezing.   Cardiovascular: Negative for chest pain, palpitations, orthopnea, claudication and leg swelling.  Gastrointestinal: Negative for abdominal pain, heartburn, nausea and vomiting.  Genitourinary: Negative.   Musculoskeletal: Negative for joint pain and myalgias.  Skin: Negative for rash.  Neurological: Negative for weakness.  Endo/Heme/Allergies: Negative.   Psychiatric/Behavioral: Positive for memory loss.    Objective:   Vitals:   07/29/20 1632  BP: 116/74  Pulse: 75  Temp: 98.2 F (36.8 C)  TempSrc: Temporal  SpO2: 99%  Weight: 198 lb 3.2 oz (89.9 kg)  Height: 5\' 3"  (1.6 m)     Physical Exam Constitutional:      General: She is not in acute distress.    Appearance: Normal appearance. She is obese. She is not ill-appearing.  HENT:     Head: Normocephalic and atraumatic.  Eyes:     General: No scleral icterus.    Conjunctiva/sclera: Conjunctivae normal.     Pupils: Pupils are equal, round, and reactive to light.  Cardiovascular:     Rate and Rhythm: Normal rate and regular rhythm.     Pulses: Normal pulses.      Heart sounds: Normal heart sounds. No murmur heard.   Pulmonary:     Effort: Pulmonary effort is normal.     Breath sounds: Normal breath sounds. No wheezing, rhonchi or rales.  Abdominal:     General: Bowel sounds are normal.     Palpations: Abdomen is soft.  Musculoskeletal:     Right lower leg: No edema.     Left lower leg: No edema.  Lymphadenopathy:     Cervical: No cervical adenopathy.  Skin:    General: Skin is warm and dry.  Neurological:     General: No focal deficit present.     Mental Status: She is alert.  Psychiatric:        Mood and Affect: Mood normal.        Behavior: Behavior normal.        Thought Content: Thought content normal.        Judgment: Judgment normal.     CBC    Component Value Date/Time   WBC 8.0 12/30/2019 0219   RBC 3.53 (L) 12/30/2019 0219   HGB 10.0 (L) 12/30/2019 0219   HCT 30.9 (L) 12/30/2019 0219   PLT 261 12/30/2019 0219   MCV 87.5 12/30/2019 0219   MCH 28.3 12/30/2019 0219   MCHC 32.4 12/30/2019 0219   RDW 13.8 12/30/2019 0219   LYMPHSABS 2.7 12/22/2019 2329   MONOABS 1.5 (H) 12/22/2019 2329   EOSABS 0.4 12/22/2019 2329   BASOSABS 0.1 12/22/2019 2329     Chest imaging: CTA chest on 12/23/19 and 12/27/19 showed similar clot burdens which included bilateral emboli of the main pulmonary arteries that extended into lobar and segmental branches. There was evidence of right heart strain.  PFT: No flowsheet data found.  Echo: 02/24/20 1. Left ventricular ejection fraction, by estimation, is 60 to 65%. The  left ventricle has normal function. The left ventricle has no regional  wall motion abnormalities. There is mild left ventricular hypertrophy.  Left ventricular diastolic parameters  are consistent with Grade I diastolic dysfunction (impaired relaxation).  2. Right ventricular systolic function is mildly reduced. The right  ventricular size is normal. Tricuspid regurgitation signal is inadequate  for assessing PA pressure.   3. The mitral valve is degenerative. Trivial mitral valve regurgitation.  No evidence of mitral stenosis.  4. The aortic valve is grossly normal. Aortic valve regurgitation is not  visualized. No aortic stenosis is present.  5. The inferior vena cava is normal in size with greater than 50%  respiratory variability, suggesting right atrial pressure of 3 mmHg.  Assessment & Plan:   Acute pulmonary embolism without acute cor pulmonale, unspecified pulmonary embolism type ([place]) - Plan: ECHOCARDIOGRAM LIMITED  Abnormal uterine bleeding - Plan: Ambulatory referral to Obstetrics / Gynecology  Discussion: Shealene Paro is a 80 year old woman, non-smoker with history of diabetes mellitus type II and hypertension who was admitted for pulmonary emboli and DVT on 12/22/19 and 12/26/19, who returns to pulmonary clinic for follow up.   She has overall done well on eliquis for her DVT and PE treatment. She is to continue on eliquis therapy. We will check an ECHO as she has not had one done since she was hospitalized with reduced RV function.   We will place another referral to OB/GYN for her to be evaluated for abnormal uterine bleeding. If a biopsy or procedure is required she should hold the eliquis at least 48 hours prior to the procedure.   Follow up in 4 months.  Freda Jackson, MD [facility] Office: [phone]   See Amion for Pager Details      Current Outpatient Medications:  .  Accu-Chek FastClix Lancets MISC, , Disp: , Rfl:  .  ACCU-CHEK SMARTVIEW test strip, , Disp: , Rfl:  .  amLODipine-valsartan (EXFORGE) 5-320 MG tablet, , Disp: , Rfl:  .  apixaban (ELIQUIS) 5 MG TABS tablet, Please take 10 mg oral twice  daily for next 5 days, then transition to 5 mg oral twice daily on 01/04/2020, Disp: 70 tablet, Rfl: 0 .  chlorthalidone (HYGROTON) 25 MG tablet, Take 12.5 mg by mouth daily., Disp: , Rfl:  .  cholecalciferol (VITAMIN D3) 25 MCG (1000 UNIT) tablet,  Take 1,000 Units by mouth daily., Disp: , Rfl:  .  colestipol (COLESTID) 1 g tablet, Take 1 tablet (1 g total) by mouth 2 (two) times daily., Disp: 60 tablet, Rfl: 0 .  dicyclomine (BENTYL) 10 MG capsule, Take 1 capsule (10 mg total) by mouth every 8 (eight) hours as needed for spasms., Disp: 30 capsule, Rfl: 1 .  donepezil (ARICEPT) 10 MG tablet, Take 10 mg by mouth at bedtime., Disp: , Rfl:  .  escitalopram (LEXAPRO) 10 MG tablet, Take 10 mg by mouth daily., Disp: , Rfl:  .  FARXIGA 10 MG TABS tablet, Take 10 mg by mouth daily., Disp: , Rfl:  .  loperamide (IMODIUM) 2 MG capsule, Take 2 mg by mouth at bedtime., Disp: , Rfl:  .  pantoprazole (PROTONIX) 40 MG tablet, Take 1 tablet (40 mg total) by mouth daily., Disp: 30 tablet, Rfl: 2 .  Peppermint Oil (IBGARD) 90 MG CPCR, Use as directed, Disp: 12 capsule, Rfl: 0 .  pioglitazone (ACTOS) 30 MG tablet, Take 30 mg by mouth daily., Disp: , Rfl:  .  Probiotic Product (PROBIOTIC PO), Take 1 tablet by mouth daily., Disp: , Rfl:  .  rosuvastatin (CRESTOR) 20 MG tablet, Take 20 mg by mouth at bedtime., Disp: , Rfl:  .  vitamin B-12 100 MCG tablet, Take 1 tablet (100 mcg total) by mouth daily., Disp: , Rfl:

## 2020-08-03 ENCOUNTER — Telehealth: Payer: Self-pay | Admitting: Pulmonary Disease

## 2020-08-03 NOTE — Telephone Encounter (Signed)
The appointment should be made sooner if possible. She should be seen within the next week or two if possible. If we need to, please call their office and explain the situation to them. I'm happy to speak with one of their providers or nurses if needed.    Thanks, Wille Glaser

## 2020-08-03 NOTE — Telephone Encounter (Signed)
Spoke with patient's daughter Sharyn Lull. She stated that the referral from last week finally worked. She was able to get her mother scheduled for an OB-GYN appt for 09/01/20 at 935am with Dr. Emeterio Reeve at [facility]. I asked her if the bleeding had changed since her last appointment, she stated that it was about the same but has not increased.   She wanted to know if the appt in March would be or if it needed to be sooner. She has not reached to them to see if the appt can be moved up.   She also wanted to let him know that the echocardiogram has been scheduled for 09/02/20 in [place].

## 2020-08-04 NOTE — Telephone Encounter (Signed)
Error

## 2020-09-01 ENCOUNTER — Other Ambulatory Visit: Payer: Self-pay

## 2020-09-01 ENCOUNTER — Encounter: Payer: Self-pay | Admitting: Obstetrics & Gynecology

## 2020-09-01 ENCOUNTER — Ambulatory Visit (INDEPENDENT_AMBULATORY_CARE_PROVIDER_SITE_OTHER): Payer: Medicare HMO | Admitting: Obstetrics & Gynecology

## 2020-09-01 VITALS — BP 109/42 | HR 63 | Wt 202.2 lb

## 2020-09-01 DIAGNOSIS — E119 Type 2 diabetes mellitus without complications: Secondary | ICD-10-CM

## 2020-09-01 DIAGNOSIS — I2699 Other pulmonary embolism without acute cor pulmonale: Secondary | ICD-10-CM

## 2020-09-01 DIAGNOSIS — N950 Postmenopausal bleeding: Secondary | ICD-10-CM

## 2020-09-01 NOTE — Progress Notes (Signed)
U/S on 09/09/20 @ 3p

## 2020-09-01 NOTE — Progress Notes (Signed)
Patient ID: Dawn Peterson, female   DOB: [birth year], 80 y.o.   MRN: [id number]  Chief Complaint  Patient presents with  . Abnormal Uterine Bleeding    HPI Dawn Peterson is a 80 y.o. female.  [id number] Postmenopausal for > 25 years. She was placed on Eliquis after a PE in the summer 2021, s/p Covid 19. In October she began to see some vaginal bleeding, with no bleeding for about a month, no pain. She was referred for evaluation. HPI  Past Medical History:  Diagnosis Date  . Aortic atherosclerosis ([place])   . COVID-19   . Diabetes mellitus without complication ([place])   . Diverticulosis   . GERD (gastroesophageal reflux disease)   . HLD (hyperlipidemia)   . Hypertension   . Pulmonary embolism [facility])     Past Surgical History:  Procedure Laterality Date  . TONSILLECTOMY      Family History  Problem Relation Age of Onset  . Hypertension Mother   . Congestive Heart Failure Mother   . Diabetes Mother   . Hypertension Maternal Grandmother   . Emphysema Maternal Aunt   . Colon cancer Neg Hx   . Esophageal cancer Neg Hx   . Rectal cancer Neg Hx   . Stomach cancer Neg Hx     Social History Social History   Tobacco Use  . Smoking status: Never Smoker  . Smokeless tobacco: Never Used  Vaping Use  . Vaping Use: Never used  Substance Use Topics  . Alcohol use: Not Currently  . Drug use: Never    Allergies  Allergen Reactions  . Predicort [Prednisolone] Other (See Comments)    Per daughter "makes her crazy"  . Prednisone     Per daughter "makes her crazy"  . Penicillins Rash and Other (See Comments)    Childhood allergy, pt believes she broke out in her mouth    Current Outpatient Medications  Medication Sig Dispense Refill  . Accu-Chek FastClix Lancets MISC     . ACCU-CHEK SMARTVIEW test strip     . amLODipine-valsartan (EXFORGE) 5-320 MG tablet     . apixaban (ELIQUIS) 5 MG TABS tablet Please take 10 mg oral twice daily for next 5 days, then transition to 5 mg  oral twice daily on 01/04/2020 70 tablet 0  . chlorthalidone (HYGROTON) 25 MG tablet Take 12.5 mg by mouth daily.    . cholecalciferol (VITAMIN D3) 25 MCG (1000 UNIT) tablet Take 1,000 Units by mouth daily.    . colestipol (COLESTID) 1 g tablet Take 1 tablet (1 g total) by mouth 2 (two) times daily. 60 tablet 0  . dicyclomine (BENTYL) 10 MG capsule Take 1 capsule (10 mg total) by mouth every 8 (eight) hours as needed for spasms. 30 capsule 1  . donepezil (ARICEPT) 10 MG tablet Take 10 mg by mouth at bedtime.    Marland Kitchen escitalopram (LEXAPRO) 10 MG tablet Take 10 mg by mouth daily.    Marland Kitchen FARXIGA 10 MG TABS tablet Take 10 mg by mouth daily.    Marland Kitchen loperamide (IMODIUM) 2 MG capsule Take 2 mg by mouth at bedtime.    . pioglitazone (ACTOS) 30 MG tablet Take 30 mg by mouth daily.    . Probiotic Product (PROBIOTIC PO) Take 1 tablet by mouth daily.    . rosuvastatin (CRESTOR) 20 MG tablet Take 20 mg by mouth at bedtime.    . vitamin B-12 100 MCG tablet Take 1 tablet (100 mcg total) by mouth daily.    Marland Kitchen  metFORMIN (GLUCOPHAGE-XR) 500 MG 24 hr tablet     . pantoprazole (PROTONIX) 40 MG tablet Take 1 tablet (40 mg total) by mouth daily. (Patient not taking: Reported on 09/01/2020) 30 tablet 2  . Peppermint Oil (IBGARD) 90 MG CPCR Use as directed (Patient not taking: Reported on 09/01/2020) 12 capsule 0   No current facility-administered medications for this visit.    Review of Systems Review of Systems  Constitutional: Negative.   Respiratory: Negative.   Gastrointestinal: Negative.   Genitourinary: Positive for vaginal bleeding (none today). Negative for pelvic pain and vaginal discharge.  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  Physical Exam Physical Exam Exam conducted with a chaperone present.  Constitutional:      Appearance: She is obese. She is not ill-appearing.  Cardiovascular:     Rate and Rhythm: Normal  rate.  Pulmonary:     Effort: Pulmonary effort is normal.  Genitourinary:    General: Normal vulva.     Exam position: Lithotomy position.     Vagina: Normal.     Cervix: No lesion.     Uterus: Normal.      Adnexa: Right adnexa normal and left adnexa normal.  Skin:    General: Skin is warm and dry.  Neurological:     General: No focal deficit present.     Mental Status: She is alert.  Psychiatric:        Mood and Affect: Mood normal.        Behavior: Behavior normal.     Data Reviewed  Narrative & Impression  CLINICAL DATA:  Nausea and vomiting, upper abdominal pain  EXAM: CT ABDOMEN AND PELVIS WITH CONTRAST  TECHNIQUE: Multidetector CT imaging of the abdomen and pelvis was performed using the standard protocol following bolus administration of intravenous contrast.  CONTRAST:  85mL ISOVUE-300 IOPAMIDOL (ISOVUE-300) INJECTION 61%  COMPARISON:  CT chest 12/27/2019  FINDINGS: Lower chest: Visualized lung bases are clear.  Heart size is normal.  Hepatobiliary: Suspect mural calcifications within the gallbladder wall, similar in appearance to the previous study. No focal hepatic lesion is identified. No biliary dilatation.  Pancreas: Unremarkable. No pancreatic ductal dilatation or surrounding inflammatory changes.  Spleen: Normal in size without focal abnormality.  Adrenals/Urinary Tract: Unremarkable adrenal glands. Kidneys enhance symmetrically without focal lesion, stone, or hydronephrosis. Ureters are nondilated. Urinary bladder appears unremarkable.  Stomach/Bowel: Stomach is within normal limits. Colon is well distended with oral contrast. There is sigmoid diverticulosis with mildly thickened short segment of rectosigmoid colon (series 2, image 62), which may reflect sequela of chronic diverticulitis. No evidence of active colonic inflammation. No dilated loops of bowel to suggest obstruction.  Vascular/Lymphatic: Scattered aortoiliac  atherosclerotic calcifications without aneurysm. No abdominopelvic lymphadenopathy.  Reproductive: Uterus and bilateral adnexa are unremarkable.  Other: No free fluid. No abdominopelvic fluid collection. No pneumoperitoneum. No abdominal wall hernia.  Musculoskeletal: Thoracolumbar degenerative changes with fusion of the T11-12 and L5-S1 levels. There is grade 1 anterolisthesis of L4 on L5. Rounded non expansile lucent lesion within the L3 vertebral body measuring 2.2 cm, possibly reflecting hemangioma. No additional osseous lesion identified. Stable appearance of probable sebaceous cyst in the posterior right chest wall/right flank region (series 2, image 20).  IMPRESSION: 1. No acute abdominopelvic findings. 2. Sigmoid diverticulosis with mildly thickened short segment of rectosigmoid colon, which may reflect sequela of chronic diverticulitis. No evidence of  active colonic inflammation. Underlying colonic malignancy at this location would be difficult to exclude. Consider correlation with direct visualization, if colonoscopy not recently performed. 3. Suspected mural calcifications within the gallbladder wall with a partial porcelain gallbladder appearance, similar in appearance to the previous study. No specific follow-up recommendations for this finding. Reference: J Am Coll Radiol 2013;10:953-956. 4. Rounded non-expansile lucent lesion within the L3 vertebral body measuring 2.2 cm, possibly representing a hemangioma. Correlation with prior outside imaging of the spine is suggested to assess for interval change. 5. Aortic atherosclerosis. (ICD10-I70.0).   Electronically Signed   By: Davina Poke D.O.   On: 03/05/2020 15:32    Assessment Postmenopausal bleeding after exposure to anticoagulant CT form 02/2020 reviewed-Reproductive: Uterus and bilateral adnexa are unremarkable  Plan Orders Placed This Encounter  Procedures  . US PELVIC COMPLETE WITH  TRANSVAGINAL    Standing Status:   Future    Standing Expiration Date:   09/01/2021    Order Specific Question:   Reason for Exam (SYMPTOM  OR DIAGNOSIS REQUIRED)    Answer:   postmenopausal bleeding    Order Specific Question:   Preferred imaging location?    Answer:   [facility]   If endometrial thickening or lesion endometrial sampling will be performed.    Emeterio Reeve 09/01/2020, 10:37 AM

## 2020-09-01 NOTE — Patient Instructions (Signed)
Postmenopausal Bleeding Postmenopausal bleeding is any bleeding that a woman has after she has entered menopause. Menopause is the end of a woman's fertile years. After menopause, a woman no longer ovulates and does not have menstrual periods. Therefore, she should no longer have bleeding from her vagina. Postmenopausal bleeding may have various causes, including:  Menopausal hormone therapy (MHT).  Endometrial atrophy. After menopause, low estrogen hormone levels cause the membrane that lines the uterus (endometrium) to become thin. You may have bleeding as the endometrium thins.  Endometrial hyperplasia. This condition is caused by excess estrogen hormones and low levels of progesterone hormones. The excess estrogen causes the endometrium to thicken, which can lead to bleeding. In some cases, this can lead to cancer of the uterus.  Endometrial cancer.  Noncancerous growths (polyps) on the endometrium, the lining of the uterus, or the cervix.  Uterine fibroids. These are noncancerous growths in or around the uterus muscle tissue that can cause heavy bleeding. Any type of postmenopausal bleeding, even if it appears to be a typical menstrual period, should be checked by your health care provider. Treatment will depend on the cause of the bleeding. Follow these instructions at home:  Pay attention to any changes in your symptoms. Let your health care provider know about them.  Avoid using tampons and douches as told by your health care provider.  Change your pads regularly.  Get regular pelvic exams, including Pap tests, as told by your health care provider.  Take iron supplements as told by your health care provider.  Take over-the-counter and prescription medicines only as told by your health care provider.  Keep all follow-up visits. This is important.   Contact a health care provider if:  You have new bleeding from the vagina after menopause.  You have pain in your abdomen. Get  help right away if:  You have a fever or chills.  You have severe pain with bleeding.  You are passing blood clots.  You have heavy bleeding, need more than 1 pad an hour, and have never experienced this before.  You have headaches or feel faint or dizzy. Summary  Postmenopausal bleeding is any bleeding that a woman has after she has entered into menopause.  Postmenopausal bleeding may have various causes. Treatment will depend on the cause of the bleeding.  Any type of postmenopausal bleeding, even if it appears to be a typical menstrual period, should be checked by your health care provider.  Be sure to pay attention to any changes in your symptoms and keep all follow-up visits. This information is not intended to replace advice given to you by your health care provider. Make sure you discuss any questions you have with your health care provider. Document Revised: 11/27/2019 Document Reviewed: 11/27/2019 Elsevier Patient Education  2021 Elsevier Inc.  

## 2020-09-02 ENCOUNTER — Ambulatory Visit (INDEPENDENT_AMBULATORY_CARE_PROVIDER_SITE_OTHER): Payer: Medicare HMO

## 2020-09-02 DIAGNOSIS — I2699 Other pulmonary embolism without acute cor pulmonale: Secondary | ICD-10-CM | POA: Diagnosis not present

## 2020-09-02 LAB — ECHOCARDIOGRAM LIMITED: S' Lateral: 2.4 cm

## 2020-09-02 NOTE — Progress Notes (Signed)
Limited echocardiogram performed.  Jimmy Jaceion Aday RDCS, RVT

## 2020-09-07 DIAGNOSIS — N1832 Chronic kidney disease, stage 3b: Secondary | ICD-10-CM | POA: Diagnosis not present

## 2020-09-07 DIAGNOSIS — F0150 Vascular dementia without behavioral disturbance: Secondary | ICD-10-CM | POA: Diagnosis not present

## 2020-09-07 DIAGNOSIS — E1143 Type 2 diabetes mellitus with diabetic autonomic (poly)neuropathy: Secondary | ICD-10-CM | POA: Diagnosis not present

## 2020-09-07 DIAGNOSIS — I2699 Other pulmonary embolism without acute cor pulmonale: Secondary | ICD-10-CM | POA: Diagnosis not present

## 2020-09-07 DIAGNOSIS — N950 Postmenopausal bleeding: Secondary | ICD-10-CM | POA: Diagnosis not present

## 2020-09-07 DIAGNOSIS — E1122 Type 2 diabetes mellitus with diabetic chronic kidney disease: Secondary | ICD-10-CM | POA: Diagnosis not present

## 2020-09-07 DIAGNOSIS — I700 Atherosclerosis of aorta: Secondary | ICD-10-CM | POA: Diagnosis not present

## 2020-09-07 DIAGNOSIS — K219 Gastro-esophageal reflux disease without esophagitis: Secondary | ICD-10-CM | POA: Diagnosis not present

## 2020-09-07 DIAGNOSIS — N183 Chronic kidney disease, stage 3 unspecified: Secondary | ICD-10-CM | POA: Diagnosis not present

## 2020-09-09 ENCOUNTER — Telehealth: Payer: Self-pay | Admitting: Lactation Services

## 2020-09-09 ENCOUNTER — Other Ambulatory Visit: Payer: Self-pay

## 2020-09-09 ENCOUNTER — Ambulatory Visit
Admission: RE | Admit: 2020-09-09 | Discharge: 2020-09-09 | Disposition: A | Discharge status: Home / Self Care | Payer: Medicare HMO | Source: Ambulatory Visit | Attending: Obstetrics & Gynecology | Admitting: Obstetrics & Gynecology

## 2020-09-09 DIAGNOSIS — N888 Other specified noninflammatory disorders of cervix uteri: Secondary | ICD-10-CM | POA: Diagnosis not present

## 2020-09-09 DIAGNOSIS — N939 Abnormal uterine and vaginal bleeding, unspecified: Secondary | ICD-10-CM | POA: Diagnosis not present

## 2020-09-09 DIAGNOSIS — Z780 Asymptomatic menopausal state: Secondary | ICD-10-CM | POA: Diagnosis not present

## 2020-09-09 DIAGNOSIS — N950 Postmenopausal bleeding: Secondary | ICD-10-CM | POA: Insufficient documentation

## 2020-09-09 NOTE — Telephone Encounter (Signed)
Diane from [facility] called and wants to inform Dr. Roselie Awkward of [place] results. Will forward message to Dr. Roselie Awkward for review.

## 2020-09-09 NOTE — Telephone Encounter (Signed)
Spoke with Dr. Roselie Awkward about [place] results. He advises that patient comes to the office to for a consult and endometrial biopsy.   Called and spoke with daughter who is mom's caregiver. She was given results and advisement for follow up Endometrial Biopsy. Daughter voiced understanding.

## 2020-09-09 NOTE — Progress Notes (Signed)
Please schedule endometrial biopsy

## 2020-09-13 ENCOUNTER — Telehealth: Payer: Self-pay | Admitting: Family Medicine

## 2020-09-13 NOTE — Telephone Encounter (Signed)
Daughter called, she would like a call back before her appointment, state she have a few questions.

## 2020-09-14 NOTE — Telephone Encounter (Signed)
Returned pt's daughter's phone call. VM left stating I am returning call. Call back number given.

## 2020-09-14 NOTE — Telephone Encounter (Signed)
Pt's daughter returned my call and left message with Lorriane Shire. I returned call. Endometrial biopsy procedure reviewed with pt's daughter. Explained pt may take tylenol or ibuprofen prior to procedure to help with abdominal cramps. Pt's daughter requests sooner appt; front office notified. Daughter is worried this may show presence of cancer; explained that the provider will be as clear as possible regarding risk of cancer at upcoming appt.

## 2020-09-22 DIAGNOSIS — G4700 Insomnia, unspecified: Secondary | ICD-10-CM | POA: Diagnosis not present

## 2020-09-22 DIAGNOSIS — E1165 Type 2 diabetes mellitus with hyperglycemia: Secondary | ICD-10-CM | POA: Diagnosis not present

## 2020-09-22 DIAGNOSIS — E1122 Type 2 diabetes mellitus with diabetic chronic kidney disease: Secondary | ICD-10-CM | POA: Diagnosis not present

## 2020-09-23 ENCOUNTER — Other Ambulatory Visit: Payer: Self-pay

## 2020-09-23 ENCOUNTER — Ambulatory Visit (INDEPENDENT_AMBULATORY_CARE_PROVIDER_SITE_OTHER): Payer: Medicare HMO | Admitting: Family Medicine

## 2020-09-23 ENCOUNTER — Encounter: Payer: Self-pay | Admitting: Family Medicine

## 2020-09-23 VITALS — BP 118/42 | HR 86 | Ht 63.0 in | Wt 199.1 lb

## 2020-09-23 DIAGNOSIS — N950 Postmenopausal bleeding: Secondary | ICD-10-CM

## 2020-09-23 MED ORDER — MISOPROSTOL 200 MCG PO TABS: 600.0000 ug | ORAL_TABLET | Freq: Once | ORAL | 2 refills | Status: DC

## 2020-09-23 NOTE — Progress Notes (Signed)
   GYNECOLOGY PROBLEM  VISIT ENCOUNTER NOTE  Subjective:   Dawn Peterson is a 80 y.o. [id number] female here for a a problem GYN visit.  Current complaints: vaginal bleeding- postmenopausal with thicken stripe on [place] (15mm) Was recently started on Eliquis.   Denies abnormal vaginal bleeding, discharge, pelvic pain, problems with intercourse or other gynecologic concerns.    Gynecologic History No LMP recorded. Patient is postmenopausal. Contraception: post menopausal status  Health Maintenance Due  Topic Date Due  . Hepatitis C Screening  Never done  . COVID-19 Vaccine (1) Never done  . FOOT EXAM  Never done  . OPHTHALMOLOGY EXAM  Never done  . TETANUS/TDAP  Never done  . DEXA SCAN  Never done  . PNA vac Low Risk Adult (1 of 2 - PCV13) Never done  . HEMOGLOBIN A1C  06/23/2020     The following portions of the patient's history were reviewed and updated as appropriate: allergies, current medications, past family history, past medical history, past social history, past surgical history and problem list.  Review of Systems Pertinent items are noted in HPI.   Objective:  BP (!) 118/42   Pulse 86   Ht 5\' 3"  (1.6 m)   Wt 199 lb 1.6 oz (90.3 kg)   BMI 35.27 kg/m  Gen: well appearing, NAD HEENT: no scleral icterus CV: RR Lung: Normal WOB Ext: warm well perfused  PELVIC: Normal appearing external genitalia; normal appearing vaginal mucosa and cervix.  No abnormal discharge noted.  Normal uterine size, no other palpable masses, no uterine or adnexal tenderness.    ENDOMETRIAL BIOPSY     The indications for endometrial biopsy were reviewed.   Risks of the biopsy including cramping, bleeding, infection, uterine perforation, inadequate specimen and need for additional procedures  were discussed. The patient states she understands and agrees to undergo procedure today. Consent was signed. Time out was performed.   During the pelvic exam, the cervix was prepped with Betadine. A  single-toothed tenaculum was placed on the anterior lip of the cervix to stabilize it. The 3 mm pipelle was attempted to pass into the uterus and was unable to do so.  Attempted use of plastic dilators, sound and metal dilators. Unable to pass due to stenotic os.   Cervix was somewhat friable consistent with atrophic     Assessment and Plan:   1. Postmenopausal bleeding Unable to perform endometrial bx due to stenosis Consulted colleague who was unable to assist at the time Reviewed plan to return with GYN Difficulty getting in position for exam-- recommend using pad to help move the patient.  Plan for cytotec 600mg  per vagina prior to procedure Discussed possibility that the procedure might need to be done at a surgery center if Dr. Roselie Awkward unable to collect sample.  * NEEDS pap smear as well (reviewed Epic record and was not able to find that this had been done-- found after patient left)   Please refer to After Visit Summary for other counseling recommendations.   Return in about 1 week (around 09/30/2020) for ASAP with Dr Roselie Awkward-- needs endometrial biopsy.   Future Appointments  Date Time Provider [place]  09/27/2020  2:55 PM Dawn Mode, MD [facility]     Caren Macadam, MD, MPH, ABFM Attending [place] for [facility]

## 2020-09-27 ENCOUNTER — Encounter: Payer: Self-pay | Admitting: General Practice

## 2020-09-27 ENCOUNTER — Encounter: Payer: Self-pay | Admitting: Obstetrics & Gynecology

## 2020-09-27 ENCOUNTER — Ambulatory Visit (INDEPENDENT_AMBULATORY_CARE_PROVIDER_SITE_OTHER): Payer: Medicare HMO | Admitting: Obstetrics & Gynecology

## 2020-09-27 ENCOUNTER — Other Ambulatory Visit: Payer: Self-pay

## 2020-09-27 VITALS — BP 124/64 | HR 64 | Wt 200.5 lb

## 2020-09-27 DIAGNOSIS — N882 Stricture and stenosis of cervix uteri: Secondary | ICD-10-CM

## 2020-09-27 DIAGNOSIS — N950 Postmenopausal bleeding: Secondary | ICD-10-CM | POA: Diagnosis not present

## 2020-09-27 NOTE — Patient Instructions (Signed)
Hysteroscopy Hysteroscopy is a procedure used to look inside a woman's womb (uterus). This may be done for various reasons, including:  To look for tumors and other growths in the uterus.  To evaluate abnormal bleeding, fibroid tumors, polyps, scar tissue, or uterine cancer.  To determine why a woman is unable to get pregnant or has had repeated pregnancy losses.  To locate an IUD (intrauterine device).  To place a birth control device into the fallopian tubes. During this procedure, a thin, flexible tube with a small light and camera (hysteroscope) is used to examine the uterus. The camera sends images to a monitor in the room so that your health care provider can view the inside of your uterus. A hysteroscopy should be done right after a menstrual period. Tell a health care provider about:  Any allergies you have.  All medicines you are taking, including vitamins, herbs, eye drops, creams, and over-the-counter medicines.  Any problems you or family members have had with anesthetic medicines.  Any blood disorders you have.  Any surgeries you have had.  Any medical conditions you have.  Whether you are pregnant or may be pregnant.  Whether you have been diagnosed with an STI (sexually transmitted infection) or you think you have an STI. What are the risks? Generally, this is a safe procedure. However, problems may occur, including:  Excessive bleeding.  Infection.  Damage to the uterus or other structures or organs.  Allergic reaction to medicines or fluids that are used in the procedure. What happens before the procedure? Staying hydrated Follow instructions from your health care provider about hydration, which may include:  Up to 2 hours before the procedure - you may continue to drink clear liquids, such as water, clear fruit juice, black coffee, and plain tea. Eating and drinking restrictions Follow instructions from your health care provider about eating and  drinking, which may include:  8 hours before the procedure - stop eating solid foods and drink clear liquids only.  2 hours before the procedure - stop drinking clear liquids. Medicines  Ask your health care provider about: ? Changing or stopping your regular medicines. This is especially important if you are taking diabetes medicines or blood thinners. ? Taking medicines such as aspirin and ibuprofen. These medicines can thin your blood. Do not take these medicines unless your health care provider tells you to take them. ? Taking over-the-counter medicines, vitamins, herbs, and supplements.  Medicine may be placed in your cervix the day before the procedure. This medicine causes the cervix to open (dilate). The larger opening makes it easier for the hysteroscope to be inserted into the uterus during the procedure. General instructions  Ask your health care provider: ? What steps will be taken to help prevent infection. These steps may include:  Washing skin with a germ-killing soap.  Taking antibiotic medicine.  Do not use any products that contain nicotine or tobacco for at least 4 weeks before the procedure. These products include cigarettes, chewing tobacco, and vaping devices, such as e-cigarettes. If you need help quitting, ask your health care provider.  Plan to have a responsible adult take you home from the hospital or clinic.  Plan to have a responsible adult care for you for the time you are told after you leave the hospital or clinic. This is important.  Empty your bladder before the procedure begins. What happens during the procedure?  An IV will be inserted into one of your veins.  You may be given: ?  A medicine to help you relax (sedative). ? A medicine that numbs the area around the cervix (local anesthetic). ? A medicine to make you fall asleep (general anesthetic).  A hysteroscope will be inserted through your vagina and into your uterus.  Air or fluid will  be used to enlarge your uterus to allow your health care provider to see it better. The amount of fluid used will be carefully checked throughout the procedure.  In some cases, tissue may be gently scraped from inside the uterus and sent to a lab for testing (biopsy). The procedure may vary among health care providers and hospitals. What can I expect after the procedure?  Your blood pressure, heart rate, breathing rate, and blood oxygen level will be monitored until you leave the hospital or clinic.  You may have cramps. You may be given medicines for this.  You may have bleeding, which may vary from light spotting to menstrual-like bleeding. This is normal.  If you had a biopsy, it is up to you to get the results. Ask your health care provider, or the department that is doing the procedure, when your results will be ready. Follow these instructions at home: Activity  Rest as told by your health care provider.  Return to your normal activities as told by your health care provider. Ask your health care provider what activities are safe for you.  If you were given a sedative during the procedure, it can affect you for several hours. Do not drive or operate machinery until your health care provider says that it is safe. Medicines  Do not take aspirin or other NSAIDs during recovery, as told by your healthcare provider. It can increase the risk of bleeding.  Ask your health care provider if the medicine prescribed to you: ? Requires you to avoid driving or using machinery. ? Can cause constipation. You may need to take these actions to prevent or treat constipation:  Drink enough fluid to keep your urine pale yellow.  Take over-the-counter or prescription medicines.  Eat foods that are high in fiber, such as beans, whole grains, and fresh fruits and vegetables.  Limit foods that are high in fat and processed sugars, such as fried or sweet foods. General instructions  Do not douche,  use tampons, or have sex for 2 weeks after the procedure, or until your health care provider approves.  Do not take baths, swim, or use a hot tub until your health care provider approves. Take showers instead of baths for 2 weeks, or for as long as told by your health care provider.  Keep all follow-up visits. This is important. Contact a health care provider if:  You feel dizzy or lightheaded.  You feel nauseous.  You have abnormal vaginal discharge.  You have a rash.  You have pain that does not get better with medicine.  You have chills. Get help right away if:  You have bleeding that is heavier than a normal menstrual period.  You have a fever.  You have pain or cramps that get worse.  You develop new abdominal pain.  You faint.  You have pain in your shoulder.  You are short of breath. Summary  Hysteroscopy is a procedure that is used to look inside a woman's womb (uterus).  After the procedure, you may have bleeding, which varies from light spotting to menstrual-like bleeding. This is normal. You may also have cramps.  Do not douche, use tampons, or have sex for 2 weeks after  the procedure, or until your health care provider approves.  Plan to have a responsible adult take you home from the hospital or clinic. This information is not intended to replace advice given to you by your health care provider. Make sure you discuss any questions you have with your health care provider. Document Revised: 01/28/2020 Document Reviewed: 01/28/2020 Elsevier Patient Education  2021 [employer].

## 2020-09-27 NOTE — Progress Notes (Signed)
Patient ID: Dawn Peterson, female   DOB: March 22, 1941, 80 y.o.   MRN: [id number]  Chief Complaint  Patient presents with  . endometrial biopsy     HPI Dawn Peterson is a 80 y.o. female.  [id number] LMP approximately age 35. She had vaginal bleeding one episode 4 months ago and she was referred for evaluation. Ultrasound showed 8 mm endometrial and small amount of fluid. Dr. Ernestina Patches encountered cervical stenosis when she attempted biopsy last week. The patient used vaginal cytotec prior to return to attempt biopsy today.  HPI  Past Medical History:  Diagnosis Date  . Aortic atherosclerosis ([place])   . COVID-19   . Diabetes mellitus without complication ([place])   . Diverticulosis   . GERD (gastroesophageal reflux disease)   . HLD (hyperlipidemia)   . Hypertension   . Pulmonary embolism [facility])     Past Surgical History:  Procedure Laterality Date  . TONSILLECTOMY      Family History  Problem Relation Age of Onset  . Hypertension Mother   . Congestive Heart Failure Mother   . Diabetes Mother   . Hypertension Maternal Grandmother   . Emphysema Maternal Aunt   . Colon cancer Neg Hx   . Esophageal cancer Neg Hx   . Rectal cancer Neg Hx   . Stomach cancer Neg Hx     Social History Social History   Tobacco Use  . Smoking status: Never Smoker  . Smokeless tobacco: Never Used  Vaping Use  . Vaping Use: Never used  Substance Use Topics  . Alcohol use: Not Currently  . Drug use: Never    Allergies  Allergen Reactions  . Predicort [Prednisolone] Other (See Comments)    Per daughter "makes her crazy"  . Prednisone     Per daughter "makes her crazy"  . Penicillins Rash and Other (See Comments)    Childhood allergy, pt believes she broke out in her mouth    Current Outpatient Medications  Medication Sig Dispense Refill  . Accu-Chek FastClix Lancets MISC     . ACCU-CHEK SMARTVIEW test strip     . amLODipine-valsartan (EXFORGE) 5-320 MG tablet     . apixaban (ELIQUIS) 5  MG TABS tablet Please take 10 mg oral twice daily for next 5 days, then transition to 5 mg oral twice daily on 01/04/2020 70 tablet 0  . chlorthalidone (HYGROTON) 25 MG tablet Take 12.5 mg by mouth daily.    . cholecalciferol (VITAMIN D3) 25 MCG (1000 UNIT) tablet Take 1,000 Units by mouth daily.    . colestipol (COLESTID) 1 g tablet Take 1 tablet (1 g total) by mouth 2 (two) times daily. 60 tablet 0  . dicyclomine (BENTYL) 10 MG capsule Take 1 capsule (10 mg total) by mouth every 8 (eight) hours as needed for spasms. 30 capsule 1  . donepezil (ARICEPT) 10 MG tablet Take 10 mg by mouth at bedtime.    Marland Kitchen escitalopram (LEXAPRO) 10 MG tablet Take 10 mg by mouth daily.    Marland Kitchen loperamide (IMODIUM) 2 MG capsule Take 2 mg by mouth at bedtime.    . metFORMIN (GLUCOPHAGE-XR) 500 MG 24 hr tablet     . misoprostol (CYTOTEC) 200 MCG tablet Place 3 tablets (600 mcg total) vaginally once for 1 dose. 3 tablet 2  . pantoprazole (PROTONIX) 40 MG tablet Take 1 tablet (40 mg total) by mouth daily. (Patient not taking: No sig reported) 30 tablet 2  . Peppermint Oil (IBGARD) 90 MG CPCR Use as  directed (Patient not taking: No sig reported) 12 capsule 0  . pioglitazone (ACTOS) 30 MG tablet Take 30 mg by mouth daily.    . Probiotic Product (PROBIOTIC PO) Take 1 tablet by mouth daily.    . rosuvastatin (CRESTOR) 20 MG tablet Take 20 mg by mouth at bedtime.    . vitamin B-12 100 MCG tablet Take 1 tablet (100 mcg total) by mouth daily.     No current facility-administered medications for this visit.    Review of Systems Review of Systems  Constitutional: Negative.   Genitourinary: Negative for pelvic pain, vaginal bleeding and vaginal discharge.    Blood pressure 124/64, pulse 64, weight 200 lb 8 oz (90.9 kg).  Physical Exam Physical Exam Vitals and nursing note reviewed. Exam conducted with a chaperone present.  Constitutional:      Appearance: She is obese. She is not ill-appearing.  Genitourinary:    General:  Normal vulva.     Exam position: Lithotomy position.     Vagina: No vaginal discharge or lesions.     Comments: Atrophy noted. Cervical os stenotic.  Neurological:     Mental Status: She is alert.  Psychiatric:        Mood and Affect: Mood normal.        Behavior: Behavior normal.   Patient given informed consent, signed copy in the chart, time out was performed. Appropriate time out taken. . The patient was placed in the lithotomy position and the cervix brought into view with sterile speculum.  Portio of cervix cleansed x 2 with betadine swabs.  A tenaculum was placed in the anterior lip of the cervix.  Cervix could not be dilated with available small metal instruments. All equipment was removed and accounted for.  The patient tolerated the procedure well.      Assessment Postmenopausal bleeding  Cervical stenosis (uterine cervix)    Plan She will be scheduled for hysteroscopy/D&C. The risks of surgery were discussed in detail with the patient including but not limited to: bleeding which may require transfusion or reoperation; infection which may require prolonged hospitalization or re-hospitalization and antibiotic therapy; injury to bowel, bladder, ureters and major vessels or other surrounding organs; formation of adhesions; need for additional procedures including laparotomy or subsequent procedures secondary to abnormal pathology; thromboembolic phenomenon; incisional problems and other postoperative or anesthesia complications.  Patient was told that the likelihood that her condition and symptoms will be treated effectively with this surgical management was very high; the postoperative expectations were also discussed in detail. The patient also understands the alternative treatment options which were discussed in full. All questions were answered.  She was told that she will be contacted by our surgical scheduler regarding the time and date of her surgery; routine preoperative  instructions will be given to her by the preoperative nursing team.   She is aware of need for preoperative COVID testing and subsequent quarantine from time of test to time of surgery; she will be given further preoperative instructions at that [place] screening visit.  Printed patient education handouts about the procedure were given to the patient to review at home.Emeterio Reeve 09/27/2020, 3:37 PM

## 2020-09-28 ENCOUNTER — Encounter: Payer: Self-pay | Admitting: *Deleted

## 2020-10-06 ENCOUNTER — Telehealth: Payer: Self-pay | Admitting: *Deleted

## 2020-10-06 NOTE — Telephone Encounter (Signed)
Call from patient's daughter, Sharyn Lull.  Surgery letter has been received and they have medical clearance. Will fax. Will be traveling prior to surgery and needs to schedule Covid test on 5-9 for surgery on 5-10.  Advised will confirm with Pre-Anesthesia testing.

## 2020-10-07 NOTE — Telephone Encounter (Signed)
Call to patient's daughter, Sharyn Lull. Covid testing on 5-9, day prior to surgery, will be scheduled. Baker Janus with PAT scheduling will contact her to schedule this. She is to fax notice of medical clearance.   Encounter closed.

## 2020-10-12 DIAGNOSIS — I2699 Other pulmonary embolism without acute cor pulmonale: Secondary | ICD-10-CM | POA: Diagnosis not present

## 2020-10-12 DIAGNOSIS — M79605 Pain in left leg: Secondary | ICD-10-CM | POA: Diagnosis not present

## 2020-10-12 DIAGNOSIS — Z6837 Body mass index (BMI) 37.0-37.9, adult: Secondary | ICD-10-CM | POA: Diagnosis not present

## 2020-10-12 DIAGNOSIS — M79662 Pain in left lower leg: Secondary | ICD-10-CM | POA: Diagnosis not present

## 2020-10-18 ENCOUNTER — Ambulatory Visit: Payer: Medicare HMO | Admitting: Primary Care

## 2020-10-18 DIAGNOSIS — M25562 Pain in left knee: Secondary | ICD-10-CM | POA: Diagnosis not present

## 2020-10-21 ENCOUNTER — Telehealth: Payer: Self-pay | Admitting: *Deleted

## 2020-10-21 NOTE — Telephone Encounter (Signed)
Call to patient's daughter Sharyn Lull regarding clearance for surgery. Daughter states has been faxed

## 2020-10-21 NOTE — Telephone Encounter (Signed)
Continued from last message. Dawn Peterson re-faxed clearance. Call to [facility] and advised that this is office note from 09-07-20 and does not state she is cleared for surgery. Need specific documentation that states cleared for surgery.  Also requesting to schedule PAT Covid test. Message to [facility] PAT nurse.

## 2020-10-23 DIAGNOSIS — E1165 Type 2 diabetes mellitus with hyperglycemia: Secondary | ICD-10-CM | POA: Diagnosis not present

## 2020-10-23 DIAGNOSIS — I10 Essential (primary) hypertension: Secondary | ICD-10-CM | POA: Diagnosis not present

## 2020-10-23 DIAGNOSIS — K219 Gastro-esophageal reflux disease without esophagitis: Secondary | ICD-10-CM | POA: Diagnosis not present

## 2020-11-01 ENCOUNTER — Encounter (HOSPITAL_COMMUNITY): Payer: Self-pay | Admitting: Obstetrics & Gynecology

## 2020-11-01 ENCOUNTER — Telehealth: Payer: Self-pay | Admitting: Pulmonary Disease

## 2020-11-01 ENCOUNTER — Other Ambulatory Visit (HOSPITAL_COMMUNITY)
Admission: RE | Admit: 2020-11-01 | Discharge: 2020-11-01 | Disposition: A | Discharge status: Home / Self Care | Payer: Medicare HMO | Source: Ambulatory Visit | Attending: Obstetrics & Gynecology | Admitting: Obstetrics & Gynecology

## 2020-11-01 ENCOUNTER — Telehealth: Payer: Self-pay | Admitting: *Deleted

## 2020-11-01 DIAGNOSIS — Z01812 Encounter for preprocedural laboratory examination: Secondary | ICD-10-CM | POA: Insufficient documentation

## 2020-11-01 DIAGNOSIS — Z20822 Contact with and (suspected) exposure to covid-19: Secondary | ICD-10-CM | POA: Diagnosis not present

## 2020-11-01 LAB — SARS CORONAVIRUS 2 (TAT 6-24 HRS): SARS Coronavirus 2: NEGATIVE

## 2020-11-01 NOTE — Telephone Encounter (Signed)
Call from Pre-anesthesia testing.  Patient did not receive instructions to discontinue Eliquis. Last dose yesterday, 5-8. Checking on surgery clearance. Advised has not been received, previous note was OV note that did not mention surgery clearance.   Call to Dr Roselie Awkward. Ok to proceed as scheduled but with clearance from PCP.  Call to Dr August Albino office. Left message requesting review and instructions regarding Eliquis.      Call to PCP- Dr Jeryl Columbia. Left message for Marita [place] requesting clearance be refaxed to office and surgery center.

## 2020-11-01 NOTE — Progress Notes (Signed)
Spoke with pt's daughter earlier today and asked if pt had been instructed to hold Eliquis. She stated that they had not been given any instructions on stopping. I then called Gay Filler, RN at Dr. Jordan Hawks office about the Eliquis and medical clearance. She stated she would need to call and ask Dr. Roselie Awkward about the Eliquis. She called me back and stated that as long as pt does not take tonight's dose and the morning dose tomorrow, he is ok with proceeding with the surgery. Gay Filler states she is trying to get in touch with the PCP about the medical clearance. I gave Gay Filler our fax number for them to fax it to [place] also.  I spoke with pt's daughter, Sharyn Lull again just now and reviewed medical history. She states after I had called her earlier, she made sure her mother did not take this morning's dose of Eliquis. So, last dose of Eliquis was 10/31/20 PM dose. Sharyn Lull states all of her mother's issues have stemmed from pt having Covid in 2021.  Sharyn Lull states pt's last A1C was 8.4 on 09/16/20. States fasting blood sugar has been between 120-140. Instructed Sharyn Lull not to have pt take her Actos or Farxiga in the AM. Instructed her to check pt's blood sugar when she gets up and every 2 hours until she comes to the hospital. If blood sugar is 70 or below, treat with 1/2 cup of clear juice (apple or cranberry) and recheck blood sugar 15 minutes after drinking juice. If blood sugar continues to be 70 or below, call the Short Stay department and ask to speak to a nurse. Sharyn Lull voiced understanding.   Covid test done today, result pending.  Sharyn Lull states pt has been in quarantine since the test was done and understands that she stays in quarantine until she comes to the hospital tomorrow.  Pt does have some cognitive issues since having Covid and will need her daughter to be in pre-op with her.

## 2020-11-01 NOTE — Telephone Encounter (Signed)
See phone info from 11-01-20. Encounter closed.

## 2020-11-01 NOTE — Telephone Encounter (Signed)
I would recommend holding the medication for 2 days prior to any procedure.   Thanks, Wille Glaser

## 2020-11-01 NOTE — Telephone Encounter (Signed)
Spoke with Judson Roch at Dr. Jordan Hawks office and relayed Dr. August Albino recommendations. Judson Roch stated procedure had been rescheduled so pt would be off of Eliquis 2 full day prior. Nothing further needed at this time. Routing to Dr. Erin Fulling as Juluis Rainier.

## 2020-11-01 NOTE — Telephone Encounter (Signed)
Spoke with Gay Filler and notified her of Dr. August Albino recommendation of holding Eliquis for 2 days prior to any procedure. Gay Filler then asked when pt would be able to restart Eliquis after procedure. Dr. Erin Fulling please advise.

## 2020-11-01 NOTE — Telephone Encounter (Signed)
Pt is scheduled for prodecure tomorrow 11/02/20 and took Eliquis yesterday. Pt's OBGYN office sent over message to see if pt should proceed with procedure tomorrow or wait till Eliquis is held for at least 2 days prior. Dr. Erin Fulling please advise.

## 2020-11-01 NOTE — Telephone Encounter (Signed)
Call from [place] with Dr Erin Fulling. Per Dr Erin Fulling, recommend off Eliqius for two full days before surgery. Per Helene Kelp at [place], letter of clearance was received from PCP. Update to Dr Roselie Awkward. Will cancel surgery for tomorrow and reschedule. Call to patient's daughter Sharyn Lull. Advised of all info above. Will restart Eliquis until surgery rescheduled.

## 2020-11-01 NOTE — Telephone Encounter (Signed)
Call from Amanda with Dr Dewald. Per Dr Dewald, recommend off Eliqius for two full days before surgery. Per Teresa at Pre-anesthesia, letter of clearance was received from PCP. Update to Dr Arnold. Will cancel surgery for tomorrow and reschedule. Call to patient's daughter Michelle. Advised of all info above. Will restart Eliquis until surgery rescheduled.   

## 2020-11-01 NOTE — Telephone Encounter (Signed)
She can restart the eliquis 24 hours after the procedure.

## 2020-11-02 ENCOUNTER — Encounter: Payer: Self-pay | Admitting: *Deleted

## 2020-11-03 ENCOUNTER — Encounter: Payer: Self-pay | Admitting: *Deleted

## 2020-11-04 NOTE — Telephone Encounter (Signed)
Call to patient's daughter. Voice mail has name confirmation.  Left message with rescheduled surgeyr date of 11-16-20 at 1100 at [facility]. Arrive by 9 am. Stop Eliquis 2 full days prior to surgery per [facility] with Dr Erin Fulling. Encouraged to confirm with his office.   Encounter closed.

## 2020-11-11 ENCOUNTER — Other Ambulatory Visit: Payer: Self-pay | Admitting: Obstetrics & Gynecology

## 2020-11-11 ENCOUNTER — Other Ambulatory Visit: Payer: Self-pay

## 2020-11-11 ENCOUNTER — Encounter (HOSPITAL_COMMUNITY): Payer: Self-pay | Admitting: Obstetrics & Gynecology

## 2020-11-11 NOTE — Progress Notes (Addendum)
Spoke with pt's daughter, Sharyn Lull for pre-op call. I spoke with her last week for same and pt's surgery was rescheduled. She states nothing has changed with pt's allergies, medications, medical and surgical history. Pt's last dose of Eliquis will be Saturday, 11/13/20 PM dose. Pt to hold 2 full days prior to surgery per Dr. Erin Fulling. Sharyn Lull voiced understanding. Pt is diabetic. Instructed Sharyn Lull to have pt hold her Farxiga Monday and Tuesday and her Actos Tuesday AM. She voiced understanding. Instructed her to have pt check her blood sugar when she gets up Tuesday AM and every 2 hours until she leaves for the hospital. If blood sugar is 70 or below, treat with 1/2 cup of clear juice (apple or cranberry) and recheck blood sugar 15 minutes after drinking juice. If blood sugar continues to be 70 or below, call the Short Stay department and ask to speak to a nurse. Sharyn Lull voiced understanding.   Sharyn Lull states pt has not had any recent symptoms of Covid. Pt will not need a Covid test since her surgery is scheduled as "ambulatory".   Pt's daughter, Sharyn Lull does need to be with pt in pre-op due to pt having cognitive issues.

## 2020-11-16 ENCOUNTER — Encounter (HOSPITAL_COMMUNITY): Payer: Self-pay | Admitting: Physician Assistant

## 2020-11-16 ENCOUNTER — Encounter (HOSPITAL_COMMUNITY): Payer: Self-pay | Admitting: Obstetrics & Gynecology

## 2020-11-16 ENCOUNTER — Encounter (HOSPITAL_COMMUNITY)
Admission: RE | Disposition: A | Discharge status: Home / Self Care | Payer: Self-pay | Source: Home / Self Care | Attending: Obstetrics & Gynecology

## 2020-11-16 ENCOUNTER — Ambulatory Visit (HOSPITAL_COMMUNITY)
Admission: RE | Admit: 2020-11-16 | Discharge: 2020-11-16 | Disposition: A | Discharge status: Home / Self Care | Payer: Medicare HMO | Attending: Obstetrics & Gynecology | Admitting: Obstetrics & Gynecology

## 2020-11-16 ENCOUNTER — Other Ambulatory Visit: Payer: Self-pay

## 2020-11-16 DIAGNOSIS — I10 Essential (primary) hypertension: Secondary | ICD-10-CM | POA: Diagnosis not present

## 2020-11-16 DIAGNOSIS — K219 Gastro-esophageal reflux disease without esophagitis: Secondary | ICD-10-CM | POA: Diagnosis not present

## 2020-11-16 DIAGNOSIS — Z888 Allergy status to other drugs, medicaments and biological substances status: Secondary | ICD-10-CM | POA: Insufficient documentation

## 2020-11-16 DIAGNOSIS — Z7984 Long term (current) use of oral hypoglycemic drugs: Secondary | ICD-10-CM | POA: Diagnosis not present

## 2020-11-16 DIAGNOSIS — E119 Type 2 diabetes mellitus without complications: Secondary | ICD-10-CM | POA: Insufficient documentation

## 2020-11-16 DIAGNOSIS — E785 Hyperlipidemia, unspecified: Secondary | ICD-10-CM | POA: Diagnosis not present

## 2020-11-16 DIAGNOSIS — Z8249 Family history of ischemic heart disease and other diseases of the circulatory system: Secondary | ICD-10-CM | POA: Insufficient documentation

## 2020-11-16 DIAGNOSIS — N882 Stricture and stenosis of cervix uteri: Secondary | ICD-10-CM | POA: Insufficient documentation

## 2020-11-16 DIAGNOSIS — N9971 Accidental puncture and laceration of a genitourinary system organ or structure during a genitourinary system procedure: Secondary | ICD-10-CM | POA: Insufficient documentation

## 2020-11-16 DIAGNOSIS — N950 Postmenopausal bleeding: Secondary | ICD-10-CM | POA: Diagnosis not present

## 2020-11-16 DIAGNOSIS — Z7901 Long term (current) use of anticoagulants: Secondary | ICD-10-CM | POA: Insufficient documentation

## 2020-11-16 DIAGNOSIS — Z8616 Personal history of COVID-19: Secondary | ICD-10-CM | POA: Diagnosis not present

## 2020-11-16 DIAGNOSIS — E1165 Type 2 diabetes mellitus with hyperglycemia: Secondary | ICD-10-CM | POA: Diagnosis not present

## 2020-11-16 DIAGNOSIS — Z86711 Personal history of pulmonary embolism: Secondary | ICD-10-CM | POA: Diagnosis not present

## 2020-11-16 DIAGNOSIS — Z833 Family history of diabetes mellitus: Secondary | ICD-10-CM | POA: Insufficient documentation

## 2020-11-16 DIAGNOSIS — Z880 Allergy status to penicillin: Secondary | ICD-10-CM | POA: Diagnosis not present

## 2020-11-16 DIAGNOSIS — Z825 Family history of asthma and other chronic lower respiratory diseases: Secondary | ICD-10-CM | POA: Diagnosis not present

## 2020-11-16 HISTORY — PX: HYSTEROSCOPY WITH D & C: SHX1775

## 2020-11-16 HISTORY — DX: Anemia, unspecified: D64.9

## 2020-11-16 HISTORY — DX: Other complications of anesthesia, initial encounter: T88.59XA

## 2020-11-16 LAB — BASIC METABOLIC PANEL
Anion gap: 6 (ref 5–15)
BUN: 36 mg/dL — ABNORMAL HIGH (ref 8–23)
CO2: 23 mmol/L (ref 22–32)
Calcium: 9.3 mg/dL (ref 8.9–10.3)
Chloride: 106 mmol/L (ref 98–111)
Creatinine, Ser: 1.61 mg/dL — ABNORMAL HIGH (ref 0.44–1.00)
GFR, Estimated: 33 mL/min — ABNORMAL LOW (ref >60)
Glucose, Bld: 176 mg/dL — ABNORMAL HIGH (ref 70–99)
Potassium: 4.2 mmol/L (ref 3.5–5.1)
Sodium: 135 mmol/L (ref 135–145)

## 2020-11-16 LAB — CBC
HCT: 39.2 % (ref 36.0–46.0)
Hemoglobin: 12.2 g/dL (ref 12.0–15.0)
MCH: 28 pg (ref 26.0–34.0)
MCHC: 31.1 g/dL (ref 30.0–36.0)
MCV: 90.1 fL (ref 80.0–100.0)
Platelets: 264 10*3/uL (ref 150–400)
RBC: 4.35 MIL/uL (ref 3.87–5.11)
RDW: 14.9 % (ref 11.5–15.5)
WBC: 9 10*3/uL (ref 4.0–10.5)
nRBC: 0 % (ref 0.0–0.2)

## 2020-11-16 LAB — GLUCOSE, CAPILLARY
Glucose-Capillary: 172 mg/dL — ABNORMAL HIGH (ref 70–99)
Glucose-Capillary: 142 mg/dL — ABNORMAL HIGH (ref 70–99)
Glucose-Capillary: 126 mg/dL — ABNORMAL HIGH (ref 70–99)

## 2020-11-16 SURGERY — DILATATION AND CURETTAGE /HYSTEROSCOPY
Anesthesia: General

## 2020-11-16 MED ORDER — OXYCODONE HCL 5 MG PO TABS: 5.0000 mg | ORAL_TABLET | Freq: Once | ORAL | Status: DC | PRN

## 2020-11-16 MED ORDER — LACTATED RINGERS IV SOLN: INTRAVENOUS | Status: DC

## 2020-11-16 MED ORDER — LIDOCAINE 2% (20 MG/ML) 5 ML SYRINGE
INTRAMUSCULAR | Status: DC | PRN
  Administered 2020-11-16: 100 mg via INTRAVENOUS

## 2020-11-16 MED ORDER — FENTANYL CITRATE (PF) 250 MCG/5ML IJ SOLN
INTRAMUSCULAR | Status: AC
  Filled 2020-11-16: qty 5

## 2020-11-16 MED ORDER — FENTANYL CITRATE (PF) 250 MCG/5ML IJ SOLN
INTRAMUSCULAR | Status: DC | PRN
  Administered 2020-11-16: 50 ug via INTRAVENOUS

## 2020-11-16 MED ORDER — POVIDONE-IODINE 10 % EX SWAB
2.0000 "application " | Freq: Once | CUTANEOUS | Status: AC
  Administered 2020-11-16: 2 via TOPICAL

## 2020-11-16 MED ORDER — PROPOFOL 10 MG/ML IV BOLUS
INTRAVENOUS | Status: AC
  Filled 2020-11-16: qty 20

## 2020-11-16 MED ORDER — PHENYLEPHRINE 40 MCG/ML (10ML) SYRINGE FOR IV PUSH (FOR BLOOD PRESSURE SUPPORT)
PREFILLED_SYRINGE | INTRAVENOUS | Status: DC | PRN
  Administered 2020-11-16: 40 ug via INTRAVENOUS
  Administered 2020-11-16: 80 ug via INTRAVENOUS
  Administered 2020-11-16: 120 ug via INTRAVENOUS

## 2020-11-16 MED ORDER — ORAL CARE MOUTH RINSE: 15.0000 mL | Freq: Once | OROMUCOSAL | Status: AC

## 2020-11-16 MED ORDER — PROPOFOL 10 MG/ML IV BOLUS
INTRAVENOUS | Status: DC | PRN
  Administered 2020-11-16: 200 mg via INTRAVENOUS

## 2020-11-16 MED ORDER — OXYCODONE HCL 5 MG/5ML PO SOLN: 5.0000 mg | Freq: Once | ORAL | Status: DC | PRN

## 2020-11-16 MED ORDER — CHLORHEXIDINE GLUCONATE 0.12 % MT SOLN: 15.0000 mL | Freq: Once | OROMUCOSAL | Status: AC

## 2020-11-16 MED ORDER — CHLORHEXIDINE GLUCONATE 0.12 % MT SOLN
OROMUCOSAL | Status: AC
  Administered 2020-11-16: 15 mL via OROMUCOSAL
  Filled 2020-11-16: qty 15

## 2020-11-16 MED ORDER — ONDANSETRON HCL 4 MG/2ML IJ SOLN: 4.0000 mg | Freq: Once | INTRAMUSCULAR | Status: DC | PRN

## 2020-11-16 MED ORDER — BUPIVACAINE HCL (PF) 0.5 % IJ SOLN
INTRAMUSCULAR | Status: DC | PRN
  Administered 2020-11-16: 2 mL

## 2020-11-16 MED ORDER — BUPIVACAINE HCL (PF) 0.5 % IJ SOLN
INTRAMUSCULAR | Status: AC
  Filled 2020-11-16: qty 30

## 2020-11-16 MED ORDER — ONDANSETRON HCL 4 MG/2ML IJ SOLN
INTRAMUSCULAR | Status: DC | PRN
  Administered 2020-11-16: 4 mg via INTRAVENOUS

## 2020-11-16 MED ORDER — SODIUM CHLORIDE 0.9 % IR SOLN
Status: DC | PRN
  Administered 2020-11-16: 3000 mL

## 2020-11-16 MED ORDER — AMISULPRIDE (ANTIEMETIC) 5 MG/2ML IV SOLN: 10.0000 mg | Freq: Once | INTRAVENOUS | Status: DC | PRN

## 2020-11-16 MED ORDER — EPHEDRINE SULFATE-NACL 50-0.9 MG/10ML-% IV SOSY
PREFILLED_SYRINGE | INTRAVENOUS | Status: DC | PRN
  Administered 2020-11-16: 15 mg via INTRAVENOUS
  Administered 2020-11-16: 5 mg via INTRAVENOUS
  Administered 2020-11-16: 10 mg via INTRAVENOUS

## 2020-11-16 MED ORDER — TRAMADOL-ACETAMINOPHEN 37.5-325 MG PO TABS: 1.0000 | ORAL_TABLET | Freq: Four times a day (QID) | ORAL | 0 refills | Status: DC | PRN

## 2020-11-16 MED ORDER — FENTANYL CITRATE (PF) 100 MCG/2ML IJ SOLN: 25.0000 ug | INTRAMUSCULAR | Status: DC | PRN

## 2020-11-16 MED ORDER — CEFAZOLIN SODIUM-DEXTROSE 2-3 GM-%(50ML) IV SOLR
INTRAVENOUS | Status: DC | PRN
  Administered 2020-11-16: 2 g via INTRAVENOUS

## 2020-11-16 SURGICAL SUPPLY — 12 items
CATH ROBINSON RED A/P 16FR (CATHETERS) ×2 IMPLANT
ELECT REM PT RETURN 9FT ADLT (ELECTROSURGICAL) ×2
ELECTRODE REM PT RTRN 9FT ADLT (ELECTROSURGICAL) ×1 IMPLANT
GLOVE BIO SURGEON STRL SZ 6.5 (GLOVE) ×2 IMPLANT
GLOVE SURG UNDER POLY LF SZ7 (GLOVE) ×4 IMPLANT
GOWN STRL REUS W/ TWL LRG LVL3 (GOWN DISPOSABLE) ×2 IMPLANT
GOWN STRL REUS W/TWL LRG LVL3 (GOWN DISPOSABLE) ×2
KIT PROCEDURE FLUENT (KITS) ×2 IMPLANT
KIT TURNOVER KIT B (KITS) ×2 IMPLANT
PACK VAGINAL MINOR WOMEN LF (CUSTOM PROCEDURE TRAY) ×2 IMPLANT
PAD OB MATERNITY 4.3X12.25 (PERSONAL CARE ITEMS) ×2 IMPLANT
TOWEL GREEN STERILE FF (TOWEL DISPOSABLE) ×4 IMPLANT

## 2020-11-16 NOTE — Anesthesia Preprocedure Evaluation (Signed)
Anesthesia Evaluation  Patient identified by MRN, date of birth, ID band Patient awake    Reviewed: Allergy & Precautions, NPO status , Patient's Chart, lab work & pertinent test results  History of Anesthesia Complications Negative for: history of anesthetic complications  Airway Mallampati: III  TM Distance: >3 FB Neck ROM: Full    Dental  (+) Teeth Intact, Dental Advisory Given   Pulmonary PE   Pulmonary exam normal        Cardiovascular hypertension, Pt. on medications Normal cardiovascular exam     Neuro/Psych negative neurological ROS     GI/Hepatic Neg liver ROS, GERD  ,  Endo/Other  diabetes, Type 2  Renal/GU negative Renal ROS  negative genitourinary   Musculoskeletal negative musculoskeletal ROS (+)   Abdominal   Peds  Hematology Eliquis   Anesthesia Other Findings  Echo 09/02/20: EF 60-65%, normal RV function, normal valves, no aortic stenosis  Reproductive/Obstetrics PMB, cervical stenosis                           Anesthesia Physical Anesthesia Plan  ASA: III  Anesthesia Plan: General   Post-op Pain Management:    Induction: Intravenous  PONV Risk Score and Plan: 3 and Ondansetron, Dexamethasone, Midazolam and Treatment may vary due to age or medical condition  Airway Management Planned: LMA  Additional Equipment: None  Intra-op Plan:   Post-operative Plan: Extubation in OR  Informed Consent: I have reviewed the patients History and Physical, chart, labs and discussed the procedure including the risks, benefits and alternatives for the proposed anesthesia with the patient or authorized representative who has indicated his/her understanding and acceptance.     Dental advisory given  Plan Discussed with:   Anesthesia Plan Comments:         Anesthesia Quick Evaluation

## 2020-11-16 NOTE — Transfer of Care (Signed)
Immediate Anesthesia Transfer of Care Note  Patient: Dawn Peterson  Procedure(s) Performed: CERVICAL DILATION WITH UTERINE PERFORATION (N/A )  Patient Location: PACU  Anesthesia Type:General  Level of Consciousness: awake, alert  and oriented  Airway & Oxygen Therapy: Patient Spontanous Breathing  Post-op Assessment: Report given to RN and Post -op Vital signs reviewed and stable  Post vital signs: Reviewed and stable  Last Vitals:  Vitals Value Taken Time  BP 110/45 11/16/20 1354  Temp    Pulse 74 11/16/20 1355  Resp 17 11/16/20 1355  SpO2 93 % 11/16/20 1355  Vitals shown include unvalidated device data.  Last Pain:  Vitals:   11/16/20 0944  TempSrc:   PainSc: 0-No pain      Patients Stated Pain Goal: 4 (56/15/37 9432)  Complications: No complications documented.

## 2020-11-16 NOTE — Anesthesia Procedure Notes (Signed)
Procedure Name: LMA Insertion Date/Time: 11/16/2020 1:10 PM Performed by: Dorthea Cove, CRNA Pre-anesthesia Checklist: Patient identified, Emergency Drugs available, Suction available and Patient being monitored Patient Re-evaluated:Patient Re-evaluated prior to induction Oxygen Delivery Method: Circle System Utilized Preoxygenation: Pre-oxygenation with 100% oxygen Induction Type: IV induction Ventilation: Mask ventilation without difficulty LMA: LMA inserted LMA Size: 4.0 Number of attempts: 1 Airway Equipment and Method: Bite block Placement Confirmation: positive ETCO2 Tube secured with: Tape Dental Injury: Teeth and Oropharynx as per pre-operative assessment

## 2020-11-16 NOTE — Anesthesia Postprocedure Evaluation (Signed)
Anesthesia Post Note  Patient: MAYMUNAH STEGEMANN  Procedure(s) Performed: CERVICAL DILATION WITH UTERINE PERFORATION (N/A )     Patient location during evaluation: PACU Anesthesia Type: General Level of consciousness: awake and alert Pain management: pain level controlled Vital Signs Assessment: post-procedure vital signs reviewed and stable Respiratory status: spontaneous breathing, nonlabored ventilation and respiratory function stable Cardiovascular status: blood pressure returned to baseline and stable Postop Assessment: no apparent nausea or vomiting Anesthetic complications: no   No complications documented.  Last Vitals:  Vitals:   11/16/20 1355 11/16/20 1410  BP: (!) 110/45 (!) 116/53  Pulse: 74 80  Resp: 17 17  Temp: (!) 36.3 C   SpO2: 93% 94%    Last Pain:  Vitals:   11/16/20 1355  TempSrc:   PainSc: 0-No pain                 Lidia Collum

## 2020-11-16 NOTE — Discharge Instructions (Signed)
Hysteroscopy Hysteroscopy is a procedure used to look inside a woman's womb (uterus). This may be done for various reasons, including:  To look for tumors and other growths in the uterus.  To evaluate abnormal bleeding, fibroid tumors, polyps, scar tissue, or uterine cancer.  To determine why a woman is unable to get pregnant or has had repeated pregnancy losses.  To locate an IUD (intrauterine device).  To place a birth control device into the fallopian tubes. During this procedure, a thin, flexible tube with a small light and camera (hysteroscope) is used to examine the uterus. The camera sends images to a monitor in the room so that your health care provider can view the inside of your uterus. A hysteroscopy should be done right after a menstrual period. Tell a health care provider about:  Any allergies you have.  All medicines you are taking, including vitamins, herbs, eye drops, creams, and over-the-counter medicines.  Any problems you or family members have had with anesthetic medicines.  Any blood disorders you have.  Any surgeries you have had.  Any medical conditions you have.  Whether you are pregnant or may be pregnant.  Whether you have been diagnosed with an STI (sexually transmitted infection) or you think you have an STI. What are the risks? Generally, this is a safe procedure. However, problems may occur, including:  Excessive bleeding.  Infection.  Damage to the uterus or other structures or organs.  Allergic reaction to medicines or fluids that are used in the procedure. What happens before the procedure? Staying hydrated Follow instructions from your health care provider about hydration, which may include:  Up to 2 hours before the procedure - you may continue to drink clear liquids, such as water, clear fruit juice, black coffee, and plain tea. Eating and drinking restrictions Follow instructions from your health care provider about eating and  drinking, which may include:  8 hours before the procedure - stop eating solid foods and drink clear liquids only.  2 hours before the procedure - stop drinking clear liquids. Medicines  Ask your health care provider about: ? Changing or stopping your regular medicines. This is especially important if you are taking diabetes medicines or blood thinners. ? Taking medicines such as aspirin and ibuprofen. These medicines can thin your blood. Do not take these medicines unless your health care provider tells you to take them. ? Taking over-the-counter medicines, vitamins, herbs, and supplements.  Medicine may be placed in your cervix the day before the procedure. This medicine causes the cervix to open (dilate). The larger opening makes it easier for the hysteroscope to be inserted into the uterus during the procedure. General instructions  Ask your health care provider: ? What steps will be taken to help prevent infection. These steps may include:  Washing skin with a germ-killing soap.  Taking antibiotic medicine.  Do not use any products that contain nicotine or tobacco for at least 4 weeks before the procedure. These products include cigarettes, chewing tobacco, and vaping devices, such as e-cigarettes. If you need help quitting, ask your health care provider.  Plan to have a responsible adult take you home from the hospital or clinic.  Plan to have a responsible adult care for you for the time you are told after you leave the hospital or clinic. This is important.  Empty your bladder before the procedure begins. What happens during the procedure?  An IV will be inserted into one of your veins.  You may be given: ?  A medicine to help you relax (sedative). ? A medicine that numbs the area around the cervix (local anesthetic). ? A medicine to make you fall asleep (general anesthetic).  A hysteroscope will be inserted through your vagina and into your uterus.  Air or fluid will  be used to enlarge your uterus to allow your health care provider to see it better. The amount of fluid used will be carefully checked throughout the procedure.  In some cases, tissue may be gently scraped from inside the uterus and sent to a lab for testing (biopsy). The procedure may vary among health care providers and hospitals. What can I expect after the procedure?  Your blood pressure, heart rate, breathing rate, and blood oxygen level will be monitored until you leave the hospital or clinic.  You may have cramps. You may be given medicines for this.  You may have bleeding, which may vary from light spotting to menstrual-like bleeding. This is normal.  If you had a biopsy, it is up to you to get the results. Ask your health care provider, or the department that is doing the procedure, when your results will be ready. Follow these instructions at home: Activity  Rest as told by your health care provider.  Return to your normal activities as told by your health care provider. Ask your health care provider what activities are safe for you.  If you were given a sedative during the procedure, it can affect you for several hours. Do not drive or operate machinery until your health care provider says that it is safe. Medicines  Do not take aspirin or other NSAIDs during recovery, as told by your healthcare provider. It can increase the risk of bleeding.  Ask your health care provider if the medicine prescribed to you: ? Requires you to avoid driving or using machinery. ? Can cause constipation. You may need to take these actions to prevent or treat constipation:  Drink enough fluid to keep your urine pale yellow.  Take over-the-counter or prescription medicines.  Eat foods that are high in fiber, such as beans, whole grains, and fresh fruits and vegetables.  Limit foods that are high in fat and processed sugars, such as fried or sweet foods. General instructions  Do not douche,  use tampons, or have sex for 2 weeks after the procedure, or until your health care provider approves.  Do not take baths, swim, or use a hot tub until your health care provider approves. Take showers instead of baths for 2 weeks, or for as long as told by your health care provider.  Keep all follow-up visits. This is important. Contact a health care provider if:  You feel dizzy or lightheaded.  You feel nauseous.  You have abnormal vaginal discharge.  You have a rash.  You have pain that does not get better with medicine.  You have chills. Get help right away if:  You have bleeding that is heavier than a normal menstrual period.  You have a fever.  You have pain or cramps that get worse.  You develop new abdominal pain.  You faint.  You have pain in your shoulder.  You are short of breath. Summary  Hysteroscopy is a procedure that is used to look inside a woman's womb (uterus).  After the procedure, you may have bleeding, which varies from light spotting to menstrual-like bleeding. This is normal. You may also have cramps.  Do not douche, use tampons, or have sex for 2 weeks after  the procedure, or until your health care provider approves.  Plan to have a responsible adult take you home from the hospital or clinic. This information is not intended to replace advice given to you by your health care provider. Make sure you discuss any questions you have with your health care provider. Document Revised: 01/28/2020 Document Reviewed: 01/28/2020 Elsevier Patient Education  2021 [employer].

## 2020-11-16 NOTE — Op Note (Signed)
PREOPERATIVE DIAGNOSIS:  Postmenopausal uterine bleeding, cervical stenosis POSTOPERATIVE DIAGNOSIS: The same PROCEDURE: Hysteroscopy, Dilation with uterine perforation identified with no sample obtained SURGEON:  Dr. Emeterio Reeve   INDICATIONS: 80 y.o. [id number]  here for scheduled surgery for the aforementioned diagnoses.   Risks of surgery were discussed with the patient including but not limited to: bleeding which may require transfusion; infection which may require antibiotics; injury to uterus or surrounding organs;need for additional procedures including laparotomy or laparoscopy; and other postoperative/anesthesia complications. Written informed consent was obtained.    FINDINGS:  A 6 week size uterus.  Cervical stenosis, posterior uterine perforation ANESTHESIA:   General, paracervical block with 10 ml of 0.5% Marcaine INTRAVENOUS FLUIDS:  1100 ml of LR FLUID DEFICITS:  222ml of saline ESTIMATED BLOOD LOSS:  Less than 20 ml SPECIMENS: none COMPLICATIONS:  None immediate.  PROCEDURE DETAILS:   .  She was then taken to the operating room where general anesthesia was administered and was found to be adequate.  After an adequate timeout was performed, she was placed in the dorsal lithotomy position and examined; then prepped and draped in the sterile manner.   Her bladder was catheterized for an unmeasured amount of clear, yellow urine. A speculum was then placed in the patient's vagina and a single tooth tenaculum was applied to the anterior lip of the cervix.   A paracervical block using 10 ml of 0.5% Marcaine was administered.  The cervix was stenotic and lacrimal dilators were used to identified the cervical canal and begin dilation. Small Hegar dilators were then used to dilate manually  to accommodate the 5 mm diagnostic hysteroscope.  Once the cervix was dilated, the hysteroscope was inserted under direct visualization using saline as a suspension medium. It was noted immediately that the  uterine cavity was not entered and that the posterior uterus was perforated. Bowel and an ovary were briefly seen.The hysteroscope was removed under direct visualization. The tenaculum was removed from the anterior lip of the cervix and the vaginal speculum was removed after noting good hemostasis. The patient received intravenous antibiotics. The patient tolerated the procedure well and was taken to the recovery area awake, extubated and in stable condition.  The patient will be discharged to home as per PACU criteria.  Routine postoperative instructions given.  She was prescribed Ultracet.  She will follow up in the clinic on 1 week  for postoperative evaluation.  Woodroe Mode, MD 11/16/2020 2:04 PM

## 2020-11-16 NOTE — H&P (Signed)
Dawn Peterson is an 80 y.o. female. [id number] Patient presents for hysteroscopy and D&C for episode of postmenopausal bleeding about 5 months ago.Ultrasound showed 8 mm endometrial and small amount of fluid and cervical stenosis prevented a biopsy attempted twice in the office at [facility]. She has had medical clearance for surgery today  Pertinent Gynecological History:  OB History: [id number]    Menstrual History: No LMP recorded. Patient is postmenopausal.    Past Medical History:  Diagnosis Date  . Anemia    as a child  . Aortic atherosclerosis ([place])   . Complication of anesthesia   . COVID-19   . COVID-19   . Diabetes mellitus without complication ([place])   . Diverticulosis   . GERD (gastroesophageal reflux disease)   . HLD (hyperlipidemia)   . Hypertension   . Pulmonary embolism [facility])     Past Surgical History:  Procedure Laterality Date  . COLONOSCOPY    . TONSILLECTOMY      Family History  Problem Relation Age of Onset  . Hypertension Mother   . Congestive Heart Failure Mother   . Diabetes Mother   . Hypertension Maternal Grandmother   . Emphysema Maternal Aunt   . Colon cancer Neg Hx   . Esophageal cancer Neg Hx   . Rectal cancer Neg Hx   . Stomach cancer Neg Hx     Social History:  reports that she has never smoked. She has never used smokeless tobacco. She reports previous alcohol use. She reports that she does not use drugs.  Allergies:  Allergies  Allergen Reactions  . Predicort [Prednisolone] Other (See Comments)    Per daughter "makes her crazy"  . Prednisone     Per daughter "makes her crazy"  . Penicillins Rash and Other (See Comments)    Childhood allergy, pt believes she broke out in her mouth    Medications Prior to Admission  Medication Sig Dispense Refill Last Dose  . amLODipine-valsartan (EXFORGE) 5-320 MG tablet Take 1 tablet by mouth daily.     Marland Kitchen apixaban (ELIQUIS) 5 MG TABS tablet Please take 10 mg oral twice daily for next 5 days, then  transition to 5 mg oral twice daily on 01/04/2020 (Patient taking differently: Take 5 mg by mouth 2 (two) times daily.) 70 tablet 0 10/31/2020 at PM dose  . chlorthalidone (HYGROTON) 25 MG tablet Take 12.5 mg by mouth daily.     . dapagliflozin propanediol (FARXIGA) 10 MG TABS tablet Take 10 mg by mouth daily.     Marland Kitchen donepezil (ARICEPT) 10 MG tablet Take 10 mg by mouth at bedtime.     Marland Kitchen escitalopram (LEXAPRO) 10 MG tablet Take 10 mg by mouth at bedtime.     . pioglitazone (ACTOS) 30 MG tablet Take 30 mg by mouth daily.     . rosuvastatin (CRESTOR) 20 MG tablet Take 20 mg by mouth 3 (three) times a week.     . vitamin B-12 100 MCG tablet Take 1 tablet (100 mcg total) by mouth daily.     . Accu-Chek FastClix Lancets MISC      . ACCU-CHEK SMARTVIEW test strip      . colestipol (COLESTID) 1 g tablet Take 1 tablet (1 g total) by mouth 2 (two) times daily. (Patient not taking: No sig reported) 60 tablet 0 Not Taking at Unknown time  . dicyclomine (BENTYL) 10 MG capsule Take 1 capsule (10 mg total) by mouth every 8 (eight) hours as needed for spasms. (Patient not taking:  Reported on 10/25/2020) 30 capsule 1 Not Taking at Unknown time  . misoprostol (CYTOTEC) 200 MCG tablet Place 3 tablets (600 mcg total) vaginally once for 1 dose. (Patient not taking: Reported on 10/25/2020) 3 tablet 2 Not Taking at Unknown time  . pantoprazole (PROTONIX) 40 MG tablet Take 1 tablet (40 mg total) by mouth daily. (Patient not taking: No sig reported) 30 tablet 2 Not Taking at Unknown time  . Peppermint Oil (IBGARD) 90 MG CPCR Use as directed (Patient not taking: No sig reported) 12 capsule 0 Not Taking at Unknown time    Review of Systems  Constitutional: Negative.   Respiratory: Negative.   Genitourinary: Negative.     There were no vitals taken for this visit. Physical Exam Vitals and nursing note reviewed.  Constitutional:      Appearance: Normal appearance. She is not ill-appearing.  HENT:     Head: Normocephalic and  atraumatic.  Pulmonary:     Effort: Pulmonary effort is normal.  Abdominal:     General: There is no distension.  Skin:    General: Skin is warm and dry.  Neurological:     Mental Status: She is alert.  Psychiatric:        Mood and Affect: Mood normal.        Behavior: Behavior normal.     No results found for this or any previous visit (from the past 24 hour(s)).  No results found.  Assessment/Plan: Hysteroscopy/D&C today. The risks of surgery were discussed in detail with the patient including but not limited to: bleeding which may require transfusion or reoperation; infection which may require prolonged hospitalization or re-hospitalization and antibiotic therapy; injury to bowel, bladder, ureters and major vessels or other surrounding organs; formation of adhesions;need for additional procedures including laparotomy or subsequent procedures secondary to abnormal pathology; thromboembolic phenomenon; incisional problems and other postoperative or anesthesia complications.  Patient was told that the likelihood that her condition and symptoms will be treated effectively with this surgical management was very high; the postoperative expectations were also discussed in detail. The patient also understands the alternative treatment options which were discussed in full. All questions were answered  Emeterio Reeve 11/16/2020, 9:17 AM

## 2020-11-17 ENCOUNTER — Encounter (HOSPITAL_COMMUNITY): Payer: Self-pay | Admitting: Obstetrics & Gynecology

## 2020-11-26 DIAGNOSIS — Z6837 Body mass index (BMI) 37.0-37.9, adult: Secondary | ICD-10-CM | POA: Diagnosis not present

## 2020-11-26 DIAGNOSIS — N3000 Acute cystitis without hematuria: Secondary | ICD-10-CM | POA: Diagnosis not present

## 2020-12-02 DIAGNOSIS — E1143 Type 2 diabetes mellitus with diabetic autonomic (poly)neuropathy: Secondary | ICD-10-CM | POA: Diagnosis not present

## 2020-12-02 DIAGNOSIS — N183 Chronic kidney disease, stage 3 unspecified: Secondary | ICD-10-CM | POA: Diagnosis not present

## 2020-12-02 DIAGNOSIS — E1122 Type 2 diabetes mellitus with diabetic chronic kidney disease: Secondary | ICD-10-CM | POA: Diagnosis not present

## 2020-12-02 DIAGNOSIS — K3184 Gastroparesis: Secondary | ICD-10-CM | POA: Diagnosis not present

## 2020-12-02 DIAGNOSIS — Z6837 Body mass index (BMI) 37.0-37.9, adult: Secondary | ICD-10-CM | POA: Diagnosis not present

## 2020-12-02 DIAGNOSIS — I2699 Other pulmonary embolism without acute cor pulmonale: Secondary | ICD-10-CM | POA: Diagnosis not present

## 2020-12-02 DIAGNOSIS — N3001 Acute cystitis with hematuria: Secondary | ICD-10-CM | POA: Diagnosis not present

## 2020-12-02 DIAGNOSIS — I700 Atherosclerosis of aorta: Secondary | ICD-10-CM | POA: Diagnosis not present

## 2020-12-02 DIAGNOSIS — F0150 Vascular dementia without behavioral disturbance: Secondary | ICD-10-CM | POA: Diagnosis not present

## 2020-12-20 ENCOUNTER — Ambulatory Visit: Payer: Medicare HMO | Admitting: Pulmonary Disease

## 2020-12-20 ENCOUNTER — Other Ambulatory Visit: Payer: Self-pay

## 2020-12-20 ENCOUNTER — Encounter: Payer: Self-pay | Admitting: Pulmonary Disease

## 2020-12-20 VITALS — BP 104/56 | HR 72 | Ht 63.0 in | Wt 207.6 lb

## 2020-12-20 DIAGNOSIS — I2699 Other pulmonary embolism without acute cor pulmonale: Secondary | ICD-10-CM | POA: Diagnosis not present

## 2020-12-20 DIAGNOSIS — Z79899 Other long term (current) drug therapy: Secondary | ICD-10-CM | POA: Diagnosis not present

## 2020-12-20 NOTE — Patient Instructions (Addendum)
She is to continue eliquis 5 mg twice daily for the blood clots.   We will continue the eliquis indefinitely unless she shows risks of bleeding due to falls or GI bleeding.   If her vaginal bleeding returns she is to call Dr. Roselie Awkward for further follow up.   Your heart ultrasound has returned to normal after our last visit. The acute right heart strain has resolved from the treatment of the blood clots.

## 2020-12-20 NOTE — Progress Notes (Signed)
Synopsis: Referred in August 2020 for DVT/PE  Subjective:   PATIENT ID: Dawn Peterson GENDER: female DOB: 05-29-1941, MRN: [id number]   HPI  Chief Complaint  Patient presents with   Follow-up    Patient state Dawn Peterson is doing good.    Dawn Peterson is a 80 year old woman, non-smoker with history of diabetes mellitus type II and hypertension who was admitted for pulmonary emboli and DVT on 12/22/19 and 12/26/19, who returns to pulmonary clinic for follow up.   She has been doing well since last visit.  She is accompanied by her husband today.  She continues on Dawn Peterson therapy for her DVT/PE.  She has not noted any further vaginal bleeding since she has been seen by the OB/GYN.  She did have vaginal ultrasound performed with attempted endometrial biopsy but this was complicated by cervical stenosis and unable to be performed.  The endometrial lining is reported at 8 mm thick.  OV 07/29/20 She was last seen in clinic on 02/20/20. She has done well since this visit. Her daughter called in at the end of December complaining that she was having vaginal bleeding. She started to have bleeding for a few days a month, mainly spotting. They report the bleeding is happening about 2 weeks per month now and still described as spotting. A referral was made to OB/GYN in early January 2022 but they never heard from the OB/GYN office.   She denies issues with bleeding from any other site. She denies chest pain or shortness of breath at this time. She continues to remain fairly sedentary throughout the day.    Past Medical History:  Diagnosis Date   Anemia    as a child   Aortic atherosclerosis ([place])    Complication of anesthesia    COVID-19    COVID-19    Diabetes mellitus without complication ([place])    Diverticulosis    GERD (gastroesophageal reflux disease)    HLD (hyperlipidemia)    Hypertension    Pulmonary embolism ([facility])      Family History  Problem Relation Age of Onset   Hypertension  Mother    Congestive Heart Failure Mother    Diabetes Mother    Hypertension Maternal Grandmother    Emphysema Maternal Aunt    Colon cancer Neg Hx    Esophageal cancer Neg Hx    Rectal cancer Neg Hx    Stomach cancer Neg Hx      Social History   Socioeconomic History   Marital status: Married    Spouse name: Not on file   Number of children: 3   Years of education: Not on file   Highest education level: Not on file  Occupational History   Occupation: retired  Tobacco Use   Smoking status: Never   Smokeless tobacco: Never  Vaping Use   Vaping Use: Never used  Substance and Sexual Activity   Alcohol use: Not Currently   Drug use: Never   Sexual activity: Not on file  Other Topics Concern   Not on file  Social History Narrative   Not on file   Social Determinants of Health   Financial Resource Strain: Not on file  Food Insecurity: No Food Insecurity   Worried About Running Out of Food in the Last Year: Never true   [place] in the Last Year: Never true  Transportation Needs: No Transportation Needs   Lack of Transportation (Medical): No   Lack of Transportation (Non-Medical): No  Physical Activity: Not on file  Stress: Not on file  Social Connections: Not on file  Intimate Partner Violence: Not on file     Allergies  Allergen Reactions   Predicort [Prednisolone] Other (See Comments)    Per daughter "makes her crazy"   Prednisone     Per daughter "makes her crazy"   Penicillins Rash and Other (See Comments)    Childhood allergy, pt believes she broke out in her mouth     Outpatient Medications Prior to Visit  Medication Sig Dispense Refill   Accu-Chek FastClix Lancets MISC      ACCU-CHEK SMARTVIEW test strip      amLODipine-valsartan (EXFORGE) 5-320 MG tablet Take 1 tablet by mouth daily.     apixaban (Dawn Peterson) 5 MG TABS tablet Please take 10 mg oral twice daily for next 5 days, then transition to 5 mg oral twice daily on 01/04/2020 (Patient taking  differently: Take 5 mg by mouth 2 (two) times daily.) 70 tablet 0   chlorthalidone (HYGROTON) 25 MG tablet Take 12.5 mg by mouth daily.     dapagliflozin propanediol (FARXIGA) 10 MG TABS tablet Take 10 mg by mouth daily.     donepezil (ARICEPT) 10 MG tablet Take 10 mg by mouth at bedtime.     escitalopram (LEXAPRO) 10 MG tablet Take 5 mg by mouth at bedtime.     pantoprazole (PROTONIX) 40 MG tablet Take 1 tablet (40 mg total) by mouth daily. 30 tablet 2   Peppermint Oil (IBGARD) 90 MG CPCR Use as directed 12 capsule 0   pioglitazone (ACTOS) 30 MG tablet Take 30 mg by mouth daily.     rosuvastatin (CRESTOR) 20 MG tablet Take 20 mg by mouth 3 (three) times a week.     vitamin B-12 100 MCG tablet Take 1 tablet (100 mcg total) by mouth daily.     colestipol (COLESTID) 1 g tablet Take 1 tablet (1 g total) by mouth 2 (two) times daily. (Patient not taking: Reported on 12/20/2020) 60 tablet 0   dicyclomine (BENTYL) 10 MG capsule Take 1 capsule (10 mg total) by mouth every 8 (eight) hours as needed for spasms. (Patient not taking: No sig reported) 30 capsule 1   traMADol-acetaminophen (ULTRACET) 37.5-325 MG tablet Take 1 tablet by mouth every 6 (six) hours as needed. (Patient not taking: Reported on 12/20/2020) 15 tablet 0   misoprostol (CYTOTEC) 200 MCG tablet Place 3 tablets (600 mcg total) vaginally once for 1 dose. (Patient not taking: Reported on 10/25/2020) 3 tablet 2   No facility-administered medications prior to visit.    Review of Systems  Constitutional:  Negative for chills, fever, malaise/fatigue and weight loss.  HENT:  Negative for congestion, sinus pain and sore throat.   Eyes: Negative.   Respiratory:  Negative for cough, hemoptysis, sputum production, shortness of breath and wheezing.   Cardiovascular:  Negative for chest pain, palpitations, orthopnea, claudication and leg swelling.  Gastrointestinal:  Negative for abdominal pain, heartburn, nausea and vomiting.  Genitourinary:  Negative.   Musculoskeletal:  Negative for joint pain and myalgias.  Skin:  Negative for rash.  Neurological:  Negative for weakness.  Endo/Heme/Allergies: Negative.    Objective:   Vitals:   12/20/20 1619  BP: (!) 104/56  Pulse: 72  SpO2: 99%  Weight: 207 lb 9.6 oz (94.2 kg)  Height: 5\' 3"  (1.6 m)     Physical Exam Constitutional:      General: She is not in acute distress.  Appearance: Normal appearance. She is obese. She is not ill-appearing.  HENT:     Head: Normocephalic and atraumatic.  Eyes:     General: No scleral icterus.    Conjunctiva/sclera: Conjunctivae normal.     Pupils: Pupils are equal, round, and reactive to light.  Cardiovascular:     Rate and Rhythm: Normal rate and regular rhythm.     Pulses: Normal pulses.     Heart sounds: Normal heart sounds. No murmur heard. Pulmonary:     Effort: Pulmonary effort is normal.     Breath sounds: Normal breath sounds. No wheezing, rhonchi or rales.  Abdominal:     General: Bowel sounds are normal.     Palpations: Abdomen is soft.  Musculoskeletal:     Right lower leg: No edema.     Left lower leg: No edema.  Skin:    General: Skin is warm and dry.  Neurological:     General: No focal deficit present.     Mental Status: She is alert.  Psychiatric:        Mood and Affect: Mood normal.        Behavior: Behavior normal.        Thought Content: Thought content normal.        Judgment: Judgment normal.    CBC    Component Value Date/Time   WBC 9.0 11/16/2020 0910   RBC 4.35 11/16/2020 0910   HGB 12.2 11/16/2020 0910   HCT 39.2 11/16/2020 0910   PLT 264 11/16/2020 0910   MCV 90.1 11/16/2020 0910   MCH 28.0 11/16/2020 0910   MCHC 31.1 11/16/2020 0910   RDW 14.9 11/16/2020 0910   LYMPHSABS 2.7 12/22/2019 2329   MONOABS 1.5 (H) 12/22/2019 2329   EOSABS 0.4 12/22/2019 2329   BASOSABS 0.1 12/22/2019 2329     Chest imaging: CTA chest on 12/23/19 and 12/27/19 showed similar clot burdens which included  bilateral emboli of the main pulmonary arteries that extended into lobar and segmental branches. There was evidence of right heart strain.  PFT: No flowsheet data found.  Echo: 02/24/20 LVEF 60 to 65%.  Normal LV function.  Mild LVH present.  Grade 1 diastolic dysfunction.  RV systolic function is mildly reduced.  RV size is normal.  Echo 09/02/20: LVEF is 60 to 07% RV systolic function is normal.  RV size is normal.   Assessment & Plan:   Pulmonary embolism, unspecified chronicity, unspecified pulmonary embolism type, unspecified whether acute cor pulmonale present [facility])  Discussion: Dawn Peterson is a 80 year old woman, non-smoker with history of diabetes mellitus type II and hypertension who was admitted for pulmonary emboli and DVT on 12/22/19 and 12/26/19, who returns to pulmonary clinic for follow up.   She continues to do well on Dawn Peterson for her DVT and PE treatment.  Repeat echo shows return to normal function of her RV.  She is to continue Dawn Peterson therapy for her DVT and PE and she is not experiencing any further vaginal bleeding at this time.  Follow up in 6 months.  Freda Jackson, MD [facility] Office: [phone]   See Amion for Pager Details      Current Outpatient Medications:    Accu-Chek FastClix Lancets MISC, , Disp: , Rfl:    ACCU-CHEK SMARTVIEW test strip, , Disp: , Rfl:    amLODipine-valsartan (EXFORGE) 5-320 MG tablet, Take 1 tablet by mouth daily., Disp: , Rfl:    apixaban (Dawn Peterson) 5 MG TABS tablet, Please  take 10 mg oral twice daily for next 5 days, then transition to 5 mg oral twice daily on 01/04/2020 (Patient taking differently: Take 5 mg by mouth 2 (two) times daily.), Disp: 70 tablet, Rfl: 0   chlorthalidone (HYGROTON) 25 MG tablet, Take 12.5 mg by mouth daily., Disp: , Rfl:    dapagliflozin propanediol (FARXIGA) 10 MG TABS tablet, Take 10 mg by mouth daily., Disp: , Rfl:    donepezil (ARICEPT) 10 MG tablet, Take 10 mg by  mouth at bedtime., Disp: , Rfl:    escitalopram (LEXAPRO) 10 MG tablet, Take 5 mg by mouth at bedtime., Disp: , Rfl:    pantoprazole (PROTONIX) 40 MG tablet, Take 1 tablet (40 mg total) by mouth daily., Disp: 30 tablet, Rfl: 2   Peppermint Oil (IBGARD) 90 MG CPCR, Use as directed, Disp: 12 capsule, Rfl: 0   pioglitazone (ACTOS) 30 MG tablet, Take 30 mg by mouth daily., Disp: , Rfl:    rosuvastatin (CRESTOR) 20 MG tablet, Take 20 mg by mouth 3 (three) times a week., Disp: , Rfl:    vitamin B-12 100 MCG tablet, Take 1 tablet (100 mcg total) by mouth daily., Disp: , Rfl:    colestipol (COLESTID) 1 g tablet, Take 1 tablet (1 g total) by mouth 2 (two) times daily. (Patient not taking: Reported on 12/20/2020), Disp: 60 tablet, Rfl: 0   dicyclomine (BENTYL) 10 MG capsule, Take 1 capsule (10 mg total) by mouth every 8 (eight) hours as needed for spasms. (Patient not taking: No sig reported), Disp: 30 capsule, Rfl: 1   traMADol-acetaminophen (ULTRACET) 37.5-325 MG tablet, Take 1 tablet by mouth every 6 (six) hours as needed. (Patient not taking: Reported on 12/20/2020), Disp: 15 tablet, Rfl: 0

## 2020-12-23 ENCOUNTER — Encounter: Payer: Self-pay | Admitting: Pulmonary Disease

## 2020-12-23 DIAGNOSIS — E1165 Type 2 diabetes mellitus with hyperglycemia: Secondary | ICD-10-CM | POA: Diagnosis not present

## 2020-12-23 DIAGNOSIS — I10 Essential (primary) hypertension: Secondary | ICD-10-CM | POA: Diagnosis not present

## 2020-12-23 DIAGNOSIS — K219 Gastro-esophageal reflux disease without esophagitis: Secondary | ICD-10-CM | POA: Diagnosis not present

## 2021-01-04 DIAGNOSIS — I440 Atrioventricular block, first degree: Secondary | ICD-10-CM | POA: Diagnosis not present

## 2021-01-04 DIAGNOSIS — I4510 Unspecified right bundle-branch block: Secondary | ICD-10-CM | POA: Diagnosis not present

## 2021-01-04 DIAGNOSIS — E785 Hyperlipidemia, unspecified: Secondary | ICD-10-CM | POA: Diagnosis not present

## 2021-01-04 DIAGNOSIS — Z20822 Contact with and (suspected) exposure to covid-19: Secondary | ICD-10-CM | POA: Diagnosis not present

## 2021-01-04 DIAGNOSIS — Z7901 Long term (current) use of anticoagulants: Secondary | ICD-10-CM | POA: Diagnosis not present

## 2021-01-04 DIAGNOSIS — E1122 Type 2 diabetes mellitus with diabetic chronic kidney disease: Secondary | ICD-10-CM | POA: Diagnosis not present

## 2021-01-04 DIAGNOSIS — Z7984 Long term (current) use of oral hypoglycemic drugs: Secondary | ICD-10-CM | POA: Diagnosis not present

## 2021-01-04 DIAGNOSIS — E1165 Type 2 diabetes mellitus with hyperglycemia: Secondary | ICD-10-CM | POA: Diagnosis not present

## 2021-01-04 DIAGNOSIS — R0602 Shortness of breath: Secondary | ICD-10-CM | POA: Diagnosis not present

## 2021-01-04 DIAGNOSIS — K5730 Diverticulosis of large intestine without perforation or abscess without bleeding: Secondary | ICD-10-CM | POA: Diagnosis not present

## 2021-01-04 DIAGNOSIS — B957 Other staphylococcus as the cause of diseases classified elsewhere: Secondary | ICD-10-CM | POA: Diagnosis not present

## 2021-01-04 DIAGNOSIS — N189 Chronic kidney disease, unspecified: Secondary | ICD-10-CM | POA: Diagnosis not present

## 2021-01-04 DIAGNOSIS — I454 Nonspecific intraventricular block: Secondary | ICD-10-CM | POA: Diagnosis not present

## 2021-01-04 DIAGNOSIS — R4020 Unspecified coma: Secondary | ICD-10-CM | POA: Diagnosis not present

## 2021-01-04 DIAGNOSIS — Z6841 Body Mass Index (BMI) 40.0 and over, adult: Secondary | ICD-10-CM | POA: Diagnosis not present

## 2021-01-04 DIAGNOSIS — R569 Unspecified convulsions: Secondary | ICD-10-CM | POA: Diagnosis not present

## 2021-01-04 DIAGNOSIS — I959 Hypotension, unspecified: Secondary | ICD-10-CM | POA: Diagnosis not present

## 2021-01-04 DIAGNOSIS — I951 Orthostatic hypotension: Secondary | ICD-10-CM | POA: Diagnosis not present

## 2021-01-04 DIAGNOSIS — R55 Syncope and collapse: Secondary | ICD-10-CM | POA: Diagnosis not present

## 2021-01-04 DIAGNOSIS — I129 Hypertensive chronic kidney disease with stage 1 through stage 4 chronic kidney disease, or unspecified chronic kidney disease: Secondary | ICD-10-CM | POA: Diagnosis not present

## 2021-01-04 DIAGNOSIS — R7881 Bacteremia: Secondary | ICD-10-CM | POA: Diagnosis not present

## 2021-01-04 DIAGNOSIS — R9401 Abnormal electroencephalogram [EEG]: Secondary | ICD-10-CM | POA: Diagnosis not present

## 2021-01-04 DIAGNOSIS — I2699 Other pulmonary embolism without acute cor pulmonale: Secondary | ICD-10-CM | POA: Diagnosis not present

## 2021-01-05 ENCOUNTER — Ambulatory Visit: Payer: Medicare HMO | Admitting: Primary Care

## 2021-01-05 DIAGNOSIS — I129 Hypertensive chronic kidney disease with stage 1 through stage 4 chronic kidney disease, or unspecified chronic kidney disease: Secondary | ICD-10-CM | POA: Diagnosis not present

## 2021-01-05 DIAGNOSIS — E785 Hyperlipidemia, unspecified: Secondary | ICD-10-CM | POA: Diagnosis not present

## 2021-01-05 DIAGNOSIS — R9401 Abnormal electroencephalogram [EEG]: Secondary | ICD-10-CM | POA: Diagnosis not present

## 2021-01-05 DIAGNOSIS — E1122 Type 2 diabetes mellitus with diabetic chronic kidney disease: Secondary | ICD-10-CM | POA: Diagnosis not present

## 2021-01-05 DIAGNOSIS — Z7984 Long term (current) use of oral hypoglycemic drugs: Secondary | ICD-10-CM | POA: Diagnosis not present

## 2021-01-05 DIAGNOSIS — N189 Chronic kidney disease, unspecified: Secondary | ICD-10-CM | POA: Diagnosis not present

## 2021-01-05 DIAGNOSIS — R569 Unspecified convulsions: Secondary | ICD-10-CM | POA: Diagnosis not present

## 2021-01-05 DIAGNOSIS — R55 Syncope and collapse: Secondary | ICD-10-CM | POA: Diagnosis not present

## 2021-01-05 DIAGNOSIS — E1165 Type 2 diabetes mellitus with hyperglycemia: Secondary | ICD-10-CM | POA: Diagnosis not present

## 2021-01-13 DIAGNOSIS — L723 Sebaceous cyst: Secondary | ICD-10-CM | POA: Diagnosis not present

## 2021-01-13 DIAGNOSIS — N950 Postmenopausal bleeding: Secondary | ICD-10-CM | POA: Diagnosis not present

## 2021-01-13 DIAGNOSIS — Z7689 Persons encountering health services in other specified circumstances: Secondary | ICD-10-CM | POA: Diagnosis not present

## 2021-01-13 DIAGNOSIS — K3184 Gastroparesis: Secondary | ICD-10-CM | POA: Diagnosis not present

## 2021-01-13 DIAGNOSIS — K8020 Calculus of gallbladder without cholecystitis without obstruction: Secondary | ICD-10-CM | POA: Diagnosis not present

## 2021-01-13 DIAGNOSIS — R7881 Bacteremia: Secondary | ICD-10-CM | POA: Diagnosis not present

## 2021-01-13 DIAGNOSIS — L089 Local infection of the skin and subcutaneous tissue, unspecified: Secondary | ICD-10-CM | POA: Diagnosis not present

## 2021-01-13 DIAGNOSIS — B957 Other staphylococcus as the cause of diseases classified elsewhere: Secondary | ICD-10-CM | POA: Diagnosis not present

## 2021-01-13 DIAGNOSIS — E1143 Type 2 diabetes mellitus with diabetic autonomic (poly)neuropathy: Secondary | ICD-10-CM | POA: Diagnosis not present

## 2021-01-18 DIAGNOSIS — K8020 Calculus of gallbladder without cholecystitis without obstruction: Secondary | ICD-10-CM | POA: Diagnosis not present

## 2021-01-19 DIAGNOSIS — L720 Epidermal cyst: Secondary | ICD-10-CM | POA: Diagnosis not present

## 2021-01-19 DIAGNOSIS — L729 Follicular cyst of the skin and subcutaneous tissue, unspecified: Secondary | ICD-10-CM | POA: Diagnosis not present

## 2021-01-23 DIAGNOSIS — K219 Gastro-esophageal reflux disease without esophagitis: Secondary | ICD-10-CM | POA: Diagnosis not present

## 2021-01-23 DIAGNOSIS — E1165 Type 2 diabetes mellitus with hyperglycemia: Secondary | ICD-10-CM | POA: Diagnosis not present

## 2021-01-23 DIAGNOSIS — I10 Essential (primary) hypertension: Secondary | ICD-10-CM | POA: Diagnosis not present

## 2021-01-26 DIAGNOSIS — F0150 Vascular dementia without behavioral disturbance: Secondary | ICD-10-CM | POA: Diagnosis not present

## 2021-01-26 DIAGNOSIS — K8020 Calculus of gallbladder without cholecystitis without obstruction: Secondary | ICD-10-CM | POA: Diagnosis not present

## 2021-01-26 DIAGNOSIS — Z6839 Body mass index (BMI) 39.0-39.9, adult: Secondary | ICD-10-CM | POA: Diagnosis not present

## 2021-01-26 DIAGNOSIS — I10 Essential (primary) hypertension: Secondary | ICD-10-CM | POA: Diagnosis not present

## 2021-01-31 DIAGNOSIS — D72829 Elevated white blood cell count, unspecified: Secondary | ICD-10-CM | POA: Diagnosis not present

## 2021-02-02 DIAGNOSIS — L720 Epidermal cyst: Secondary | ICD-10-CM | POA: Diagnosis not present

## 2021-02-03 DIAGNOSIS — R110 Nausea: Secondary | ICD-10-CM | POA: Diagnosis not present

## 2021-02-03 DIAGNOSIS — Z6839 Body mass index (BMI) 39.0-39.9, adult: Secondary | ICD-10-CM | POA: Diagnosis not present

## 2021-02-03 DIAGNOSIS — K8020 Calculus of gallbladder without cholecystitis without obstruction: Secondary | ICD-10-CM | POA: Diagnosis not present

## 2021-02-07 DIAGNOSIS — L720 Epidermal cyst: Secondary | ICD-10-CM | POA: Diagnosis not present

## 2021-02-07 DIAGNOSIS — L723 Sebaceous cyst: Secondary | ICD-10-CM | POA: Diagnosis not present

## 2021-02-08 DIAGNOSIS — N882 Stricture and stenosis of cervix uteri: Secondary | ICD-10-CM | POA: Diagnosis not present

## 2021-02-08 DIAGNOSIS — N950 Postmenopausal bleeding: Secondary | ICD-10-CM | POA: Diagnosis not present

## 2021-02-08 DIAGNOSIS — R9389 Abnormal findings on diagnostic imaging of other specified body structures: Secondary | ICD-10-CM | POA: Diagnosis not present

## 2021-02-21 DIAGNOSIS — R109 Unspecified abdominal pain: Secondary | ICD-10-CM | POA: Diagnosis not present

## 2021-02-21 DIAGNOSIS — Z5320 Procedure and treatment not carried out because of patient's decision for unspecified reasons: Secondary | ICD-10-CM | POA: Diagnosis not present

## 2021-03-02 DIAGNOSIS — Z6837 Body mass index (BMI) 37.0-37.9, adult: Secondary | ICD-10-CM | POA: Diagnosis not present

## 2021-03-02 DIAGNOSIS — N183 Chronic kidney disease, stage 3 unspecified: Secondary | ICD-10-CM | POA: Diagnosis not present

## 2021-03-02 DIAGNOSIS — E1122 Type 2 diabetes mellitus with diabetic chronic kidney disease: Secondary | ICD-10-CM | POA: Diagnosis not present

## 2021-03-02 DIAGNOSIS — E1143 Type 2 diabetes mellitus with diabetic autonomic (poly)neuropathy: Secondary | ICD-10-CM | POA: Diagnosis not present

## 2021-03-02 DIAGNOSIS — K3184 Gastroparesis: Secondary | ICD-10-CM | POA: Diagnosis not present

## 2021-03-02 DIAGNOSIS — I10 Essential (primary) hypertension: Secondary | ICD-10-CM | POA: Diagnosis not present

## 2021-03-02 DIAGNOSIS — A419 Sepsis, unspecified organism: Secondary | ICD-10-CM | POA: Diagnosis not present

## 2021-03-02 DIAGNOSIS — N950 Postmenopausal bleeding: Secondary | ICD-10-CM | POA: Diagnosis not present

## 2021-03-10 DIAGNOSIS — Z114 Encounter for screening for human immunodeficiency virus [HIV]: Secondary | ICD-10-CM | POA: Diagnosis not present

## 2021-03-10 DIAGNOSIS — Z5320 Procedure and treatment not carried out because of patient's decision for unspecified reasons: Secondary | ICD-10-CM | POA: Diagnosis not present

## 2021-03-10 DIAGNOSIS — R5381 Other malaise: Secondary | ICD-10-CM | POA: Diagnosis not present

## 2021-03-10 DIAGNOSIS — E119 Type 2 diabetes mellitus without complications: Secondary | ICD-10-CM | POA: Diagnosis not present

## 2021-03-10 DIAGNOSIS — I2699 Other pulmonary embolism without acute cor pulmonale: Secondary | ICD-10-CM | POA: Diagnosis not present

## 2021-03-10 DIAGNOSIS — I10 Essential (primary) hypertension: Secondary | ICD-10-CM | POA: Diagnosis not present

## 2021-03-10 DIAGNOSIS — F32A Depression, unspecified: Secondary | ICD-10-CM | POA: Diagnosis not present

## 2021-03-10 DIAGNOSIS — N8500 Endometrial hyperplasia, unspecified: Secondary | ICD-10-CM | POA: Diagnosis not present

## 2021-03-10 DIAGNOSIS — R413 Other amnesia: Secondary | ICD-10-CM | POA: Diagnosis not present

## 2021-03-10 DIAGNOSIS — K8020 Calculus of gallbladder without cholecystitis without obstruction: Secondary | ICD-10-CM | POA: Diagnosis not present

## 2021-03-14 DIAGNOSIS — R531 Weakness: Secondary | ICD-10-CM | POA: Diagnosis not present

## 2021-03-14 DIAGNOSIS — M545 Low back pain, unspecified: Secondary | ICD-10-CM | POA: Diagnosis not present

## 2021-03-14 DIAGNOSIS — M2560 Stiffness of unspecified joint, not elsewhere classified: Secondary | ICD-10-CM | POA: Diagnosis not present

## 2021-03-22 DIAGNOSIS — M545 Low back pain, unspecified: Secondary | ICD-10-CM | POA: Diagnosis not present

## 2021-03-22 DIAGNOSIS — R531 Weakness: Secondary | ICD-10-CM | POA: Diagnosis not present

## 2021-03-22 DIAGNOSIS — M2560 Stiffness of unspecified joint, not elsewhere classified: Secondary | ICD-10-CM | POA: Diagnosis not present

## 2021-03-25 DIAGNOSIS — I10 Essential (primary) hypertension: Secondary | ICD-10-CM | POA: Diagnosis not present

## 2021-03-25 DIAGNOSIS — M2560 Stiffness of unspecified joint, not elsewhere classified: Secondary | ICD-10-CM | POA: Diagnosis not present

## 2021-03-25 DIAGNOSIS — E1122 Type 2 diabetes mellitus with diabetic chronic kidney disease: Secondary | ICD-10-CM | POA: Diagnosis not present

## 2021-03-25 DIAGNOSIS — N1832 Chronic kidney disease, stage 3b: Secondary | ICD-10-CM | POA: Diagnosis not present

## 2021-03-25 DIAGNOSIS — R531 Weakness: Secondary | ICD-10-CM | POA: Diagnosis not present

## 2021-03-25 DIAGNOSIS — M545 Low back pain, unspecified: Secondary | ICD-10-CM | POA: Diagnosis not present

## 2021-03-25 DIAGNOSIS — F0150 Vascular dementia without behavioral disturbance: Secondary | ICD-10-CM | POA: Diagnosis not present

## 2021-03-29 DIAGNOSIS — M2560 Stiffness of unspecified joint, not elsewhere classified: Secondary | ICD-10-CM | POA: Diagnosis not present

## 2021-03-29 DIAGNOSIS — R531 Weakness: Secondary | ICD-10-CM | POA: Diagnosis not present

## 2021-03-29 DIAGNOSIS — M545 Low back pain, unspecified: Secondary | ICD-10-CM | POA: Diagnosis not present

## 2021-03-31 DIAGNOSIS — R531 Weakness: Secondary | ICD-10-CM | POA: Diagnosis not present

## 2021-03-31 DIAGNOSIS — M2560 Stiffness of unspecified joint, not elsewhere classified: Secondary | ICD-10-CM | POA: Diagnosis not present

## 2021-03-31 DIAGNOSIS — M545 Low back pain, unspecified: Secondary | ICD-10-CM | POA: Diagnosis not present

## 2021-04-06 DIAGNOSIS — M2560 Stiffness of unspecified joint, not elsewhere classified: Secondary | ICD-10-CM | POA: Diagnosis not present

## 2021-04-06 DIAGNOSIS — M545 Low back pain, unspecified: Secondary | ICD-10-CM | POA: Diagnosis not present

## 2021-04-06 DIAGNOSIS — R531 Weakness: Secondary | ICD-10-CM | POA: Diagnosis not present

## 2021-04-08 DIAGNOSIS — R531 Weakness: Secondary | ICD-10-CM | POA: Diagnosis not present

## 2021-04-08 DIAGNOSIS — M545 Low back pain, unspecified: Secondary | ICD-10-CM | POA: Diagnosis not present

## 2021-04-08 DIAGNOSIS — M2560 Stiffness of unspecified joint, not elsewhere classified: Secondary | ICD-10-CM | POA: Diagnosis not present

## 2021-04-19 DIAGNOSIS — M545 Low back pain, unspecified: Secondary | ICD-10-CM | POA: Diagnosis not present

## 2021-04-19 DIAGNOSIS — R531 Weakness: Secondary | ICD-10-CM | POA: Diagnosis not present

## 2021-04-19 DIAGNOSIS — M2560 Stiffness of unspecified joint, not elsewhere classified: Secondary | ICD-10-CM | POA: Diagnosis not present

## 2021-04-22 DIAGNOSIS — R531 Weakness: Secondary | ICD-10-CM | POA: Diagnosis not present

## 2021-04-22 DIAGNOSIS — M545 Low back pain, unspecified: Secondary | ICD-10-CM | POA: Diagnosis not present

## 2021-04-22 DIAGNOSIS — M2560 Stiffness of unspecified joint, not elsewhere classified: Secondary | ICD-10-CM | POA: Diagnosis not present

## 2021-04-25 DIAGNOSIS — N950 Postmenopausal bleeding: Secondary | ICD-10-CM | POA: Diagnosis not present

## 2021-04-27 DIAGNOSIS — N8500 Endometrial hyperplasia, unspecified: Secondary | ICD-10-CM | POA: Diagnosis not present

## 2021-04-27 DIAGNOSIS — K8020 Calculus of gallbladder without cholecystitis without obstruction: Secondary | ICD-10-CM | POA: Diagnosis not present

## 2021-04-27 DIAGNOSIS — F32A Depression, unspecified: Secondary | ICD-10-CM | POA: Diagnosis not present

## 2021-04-27 DIAGNOSIS — E119 Type 2 diabetes mellitus without complications: Secondary | ICD-10-CM | POA: Diagnosis not present

## 2021-04-27 DIAGNOSIS — R0600 Dyspnea, unspecified: Secondary | ICD-10-CM | POA: Diagnosis not present

## 2021-04-27 DIAGNOSIS — I2699 Other pulmonary embolism without acute cor pulmonale: Secondary | ICD-10-CM | POA: Diagnosis not present

## 2021-04-27 DIAGNOSIS — R5381 Other malaise: Secondary | ICD-10-CM | POA: Diagnosis not present

## 2021-04-27 DIAGNOSIS — R413 Other amnesia: Secondary | ICD-10-CM | POA: Diagnosis not present

## 2021-04-28 DIAGNOSIS — R531 Weakness: Secondary | ICD-10-CM | POA: Diagnosis not present

## 2021-04-28 DIAGNOSIS — M2560 Stiffness of unspecified joint, not elsewhere classified: Secondary | ICD-10-CM | POA: Diagnosis not present

## 2021-04-28 DIAGNOSIS — M545 Low back pain, unspecified: Secondary | ICD-10-CM | POA: Diagnosis not present

## 2021-05-02 DIAGNOSIS — M2560 Stiffness of unspecified joint, not elsewhere classified: Secondary | ICD-10-CM | POA: Diagnosis not present

## 2021-05-02 DIAGNOSIS — M545 Low back pain, unspecified: Secondary | ICD-10-CM | POA: Diagnosis not present

## 2021-05-02 DIAGNOSIS — R531 Weakness: Secondary | ICD-10-CM | POA: Diagnosis not present

## 2021-05-04 DIAGNOSIS — M545 Low back pain, unspecified: Secondary | ICD-10-CM | POA: Diagnosis not present

## 2021-05-04 DIAGNOSIS — M2560 Stiffness of unspecified joint, not elsewhere classified: Secondary | ICD-10-CM | POA: Diagnosis not present

## 2021-05-04 DIAGNOSIS — R531 Weakness: Secondary | ICD-10-CM | POA: Diagnosis not present

## 2021-05-05 DIAGNOSIS — Z780 Asymptomatic menopausal state: Secondary | ICD-10-CM | POA: Diagnosis not present

## 2021-05-05 DIAGNOSIS — Z7984 Long term (current) use of oral hypoglycemic drugs: Secondary | ICD-10-CM | POA: Diagnosis not present

## 2021-05-05 DIAGNOSIS — E669 Obesity, unspecified: Secondary | ICD-10-CM | POA: Diagnosis not present

## 2021-05-05 DIAGNOSIS — E119 Type 2 diabetes mellitus without complications: Secondary | ICD-10-CM | POA: Diagnosis not present

## 2021-05-05 DIAGNOSIS — I10 Essential (primary) hypertension: Secondary | ICD-10-CM | POA: Diagnosis not present

## 2021-05-05 DIAGNOSIS — R0602 Shortness of breath: Secondary | ICD-10-CM | POA: Diagnosis not present

## 2021-05-05 DIAGNOSIS — N950 Postmenopausal bleeding: Secondary | ICD-10-CM | POA: Diagnosis not present

## 2021-05-05 DIAGNOSIS — Z79899 Other long term (current) drug therapy: Secondary | ICD-10-CM | POA: Diagnosis not present

## 2021-05-05 DIAGNOSIS — Z7901 Long term (current) use of anticoagulants: Secondary | ICD-10-CM | POA: Diagnosis not present

## 2021-05-10 DIAGNOSIS — R0600 Dyspnea, unspecified: Secondary | ICD-10-CM | POA: Diagnosis not present

## 2021-05-10 DIAGNOSIS — K828 Other specified diseases of gallbladder: Secondary | ICD-10-CM | POA: Diagnosis not present

## 2021-05-11 DIAGNOSIS — R531 Weakness: Secondary | ICD-10-CM | POA: Diagnosis not present

## 2021-05-11 DIAGNOSIS — M545 Low back pain, unspecified: Secondary | ICD-10-CM | POA: Diagnosis not present

## 2021-05-11 DIAGNOSIS — M2560 Stiffness of unspecified joint, not elsewhere classified: Secondary | ICD-10-CM | POA: Diagnosis not present

## 2021-05-13 DIAGNOSIS — M2560 Stiffness of unspecified joint, not elsewhere classified: Secondary | ICD-10-CM | POA: Diagnosis not present

## 2021-05-13 DIAGNOSIS — R531 Weakness: Secondary | ICD-10-CM | POA: Diagnosis not present

## 2021-05-13 DIAGNOSIS — M545 Low back pain, unspecified: Secondary | ICD-10-CM | POA: Diagnosis not present

## 2021-05-17 DIAGNOSIS — R531 Weakness: Secondary | ICD-10-CM | POA: Diagnosis not present

## 2021-05-17 DIAGNOSIS — M545 Low back pain, unspecified: Secondary | ICD-10-CM | POA: Diagnosis not present

## 2021-05-17 DIAGNOSIS — M2560 Stiffness of unspecified joint, not elsewhere classified: Secondary | ICD-10-CM | POA: Diagnosis not present

## 2021-05-20 DIAGNOSIS — M545 Low back pain, unspecified: Secondary | ICD-10-CM | POA: Diagnosis not present

## 2021-05-20 DIAGNOSIS — R531 Weakness: Secondary | ICD-10-CM | POA: Diagnosis not present

## 2021-05-20 DIAGNOSIS — M2560 Stiffness of unspecified joint, not elsewhere classified: Secondary | ICD-10-CM | POA: Diagnosis not present

## 2021-05-23 DIAGNOSIS — R531 Weakness: Secondary | ICD-10-CM | POA: Diagnosis not present

## 2021-05-23 DIAGNOSIS — M545 Low back pain, unspecified: Secondary | ICD-10-CM | POA: Diagnosis not present

## 2021-05-23 DIAGNOSIS — M2560 Stiffness of unspecified joint, not elsewhere classified: Secondary | ICD-10-CM | POA: Diagnosis not present

## 2021-05-27 DIAGNOSIS — M545 Low back pain, unspecified: Secondary | ICD-10-CM | POA: Diagnosis not present

## 2021-05-27 DIAGNOSIS — M2560 Stiffness of unspecified joint, not elsewhere classified: Secondary | ICD-10-CM | POA: Diagnosis not present

## 2021-05-27 DIAGNOSIS — R531 Weakness: Secondary | ICD-10-CM | POA: Diagnosis not present

## 2021-05-30 DIAGNOSIS — R531 Weakness: Secondary | ICD-10-CM | POA: Diagnosis not present

## 2021-05-30 DIAGNOSIS — M545 Low back pain, unspecified: Secondary | ICD-10-CM | POA: Diagnosis not present

## 2021-05-30 DIAGNOSIS — M2560 Stiffness of unspecified joint, not elsewhere classified: Secondary | ICD-10-CM | POA: Diagnosis not present

## 2021-08-01 DIAGNOSIS — Z0000 Encounter for general adult medical examination without abnormal findings: Secondary | ICD-10-CM | POA: Diagnosis not present

## 2021-08-01 DIAGNOSIS — R0600 Dyspnea, unspecified: Secondary | ICD-10-CM | POA: Diagnosis not present

## 2021-08-01 DIAGNOSIS — E119 Type 2 diabetes mellitus without complications: Secondary | ICD-10-CM | POA: Diagnosis not present

## 2021-08-01 DIAGNOSIS — Z7289 Other problems related to lifestyle: Secondary | ICD-10-CM | POA: Diagnosis not present

## 2021-08-01 DIAGNOSIS — M810 Age-related osteoporosis without current pathological fracture: Secondary | ICD-10-CM | POA: Diagnosis not present

## 2021-08-04 DIAGNOSIS — I2699 Other pulmonary embolism without acute cor pulmonale: Secondary | ICD-10-CM | POA: Diagnosis not present

## 2021-08-04 DIAGNOSIS — R5381 Other malaise: Secondary | ICD-10-CM | POA: Diagnosis not present

## 2021-08-04 DIAGNOSIS — I2609 Other pulmonary embolism with acute cor pulmonale: Secondary | ICD-10-CM | POA: Diagnosis not present

## 2021-08-04 DIAGNOSIS — R413 Other amnesia: Secondary | ICD-10-CM | POA: Diagnosis not present

## 2021-08-04 DIAGNOSIS — M25569 Pain in unspecified knee: Secondary | ICD-10-CM | POA: Diagnosis not present

## 2021-08-04 DIAGNOSIS — E119 Type 2 diabetes mellitus without complications: Secondary | ICD-10-CM | POA: Diagnosis not present

## 2021-08-04 DIAGNOSIS — Z6841 Body Mass Index (BMI) 40.0 and over, adult: Secondary | ICD-10-CM | POA: Diagnosis not present

## 2021-08-04 DIAGNOSIS — F32A Depression, unspecified: Secondary | ICD-10-CM | POA: Diagnosis not present

## 2021-08-04 DIAGNOSIS — R0600 Dyspnea, unspecified: Secondary | ICD-10-CM | POA: Diagnosis not present

## 2021-08-04 DIAGNOSIS — N8500 Endometrial hyperplasia, unspecified: Secondary | ICD-10-CM | POA: Diagnosis not present

## 2021-08-04 DIAGNOSIS — G4700 Insomnia, unspecified: Secondary | ICD-10-CM | POA: Diagnosis not present

## 2021-08-15 DIAGNOSIS — R21 Rash and other nonspecific skin eruption: Secondary | ICD-10-CM | POA: Diagnosis not present

## 2021-09-01 DIAGNOSIS — Z0000 Encounter for general adult medical examination without abnormal findings: Secondary | ICD-10-CM | POA: Diagnosis not present

## 2021-09-01 DIAGNOSIS — Z780 Asymptomatic menopausal state: Secondary | ICD-10-CM | POA: Diagnosis not present

## 2021-10-05 IMAGING — CT CT ANGIO CHEST
2 of 6 series · 16 of 36 positions shown · IV contrast (omnipaque)
Comparison: CT 12/23/2019, radiograph 12/26/2019

CLINICAL DATA: Recent massive PE, return of symptoms today

EXAM:
CT ANGIOGRAPHY CHEST WITH CONTRAST
TECHNIQUE: Multidetector CT imaging of the chest was performed using the
standard protocol during bolus administration of intravenous
contrast. Multiplanar CT image reconstructions and MIPs were
obtained to evaluate the vascular anatomy.
CONTRAST:  100mL OMNIPAQUE IOHEXOL 350 MG/ML SOLN

[Series 7: pe thins · axial · 0.74mm/px · z∈[+1169,+1447]mm · 15 of 443 slices shown]
[im 23/443  lung]
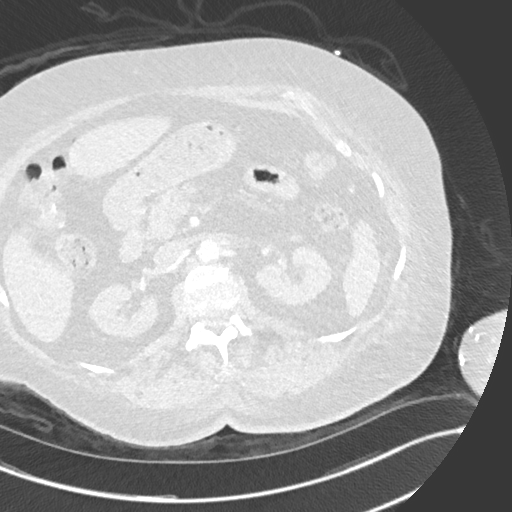
[im 45/443  mediastinal]
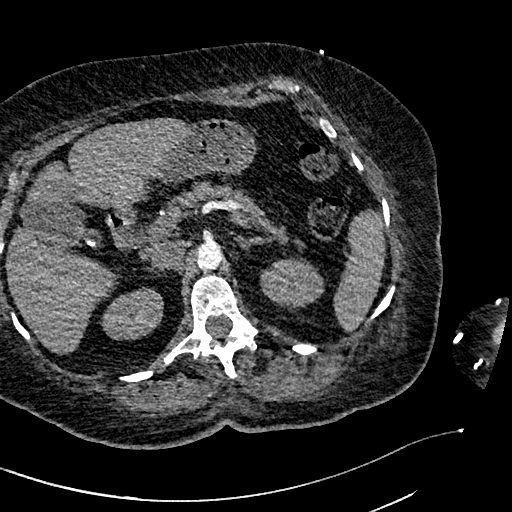
[im 89/443  lung]
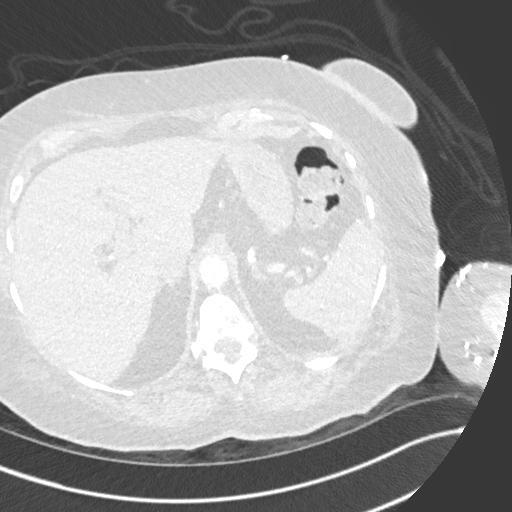
[im 111/443  mediastinal]
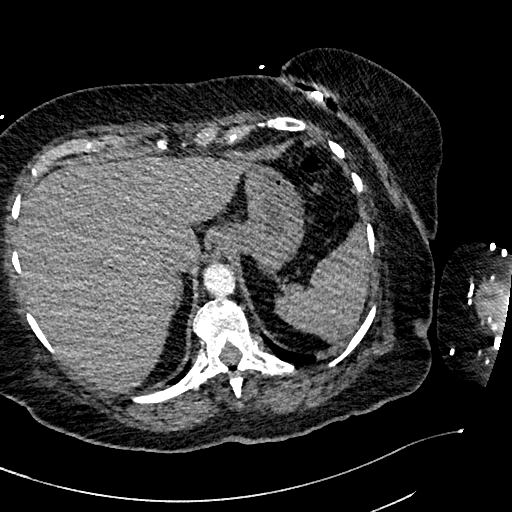
[im 133/443  lung]
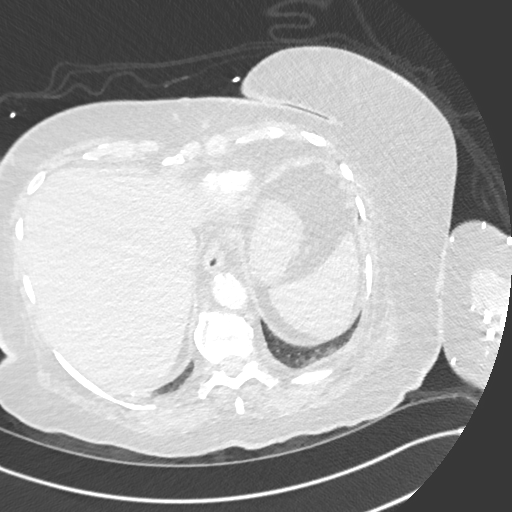
[im 155/443  mediastinal]
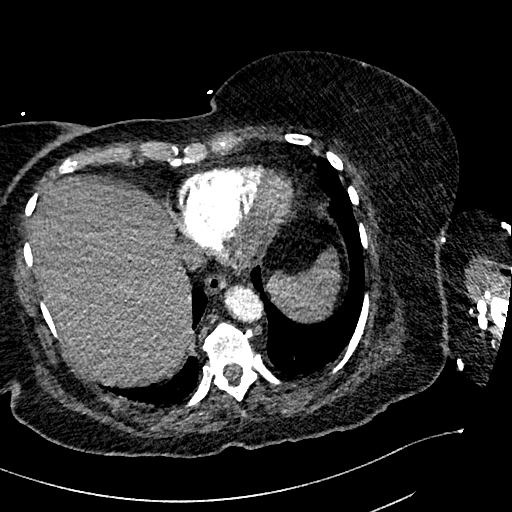
[im 199/443  lung]
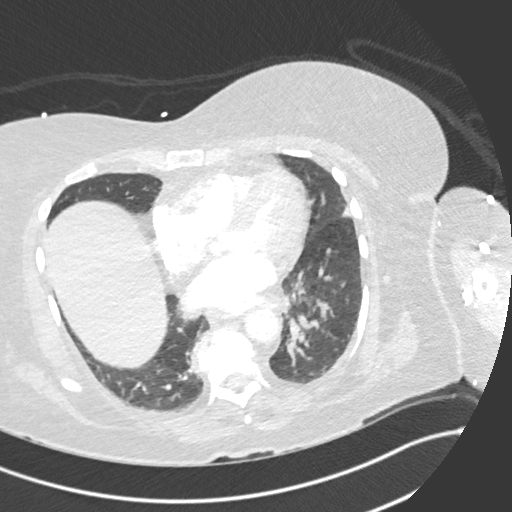
[im 222/443  mediastinal]
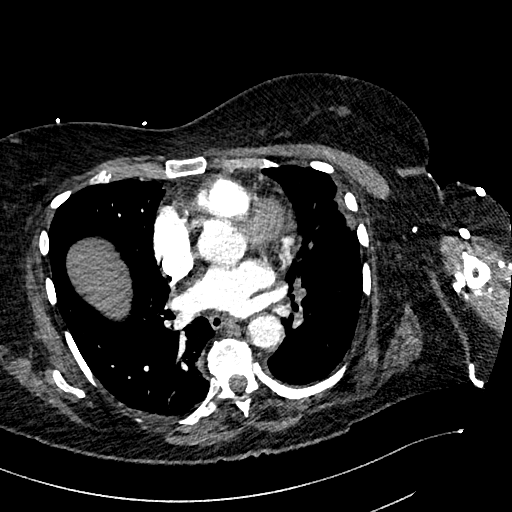
[im 244/443  lung]
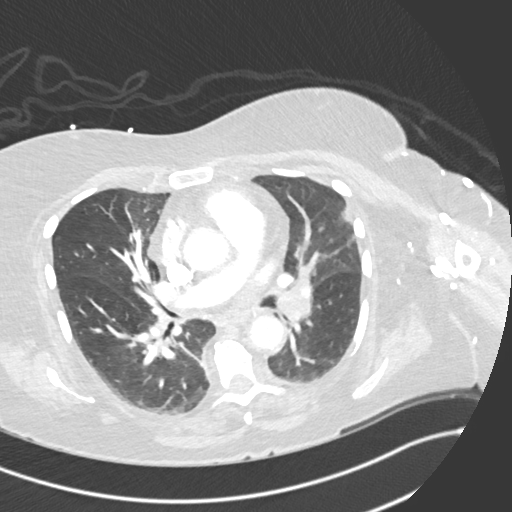
[im 288/443  mediastinal]
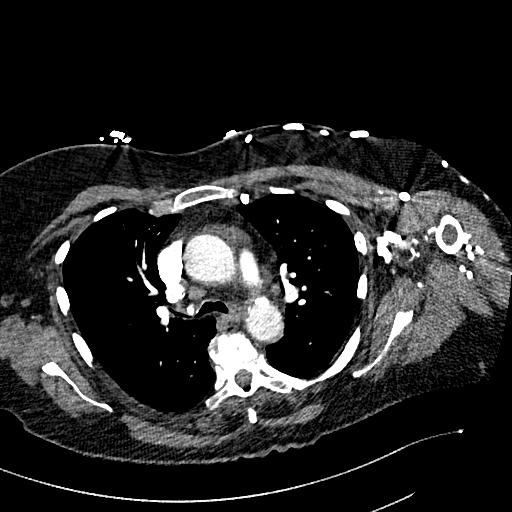
[im 310/443  lung]
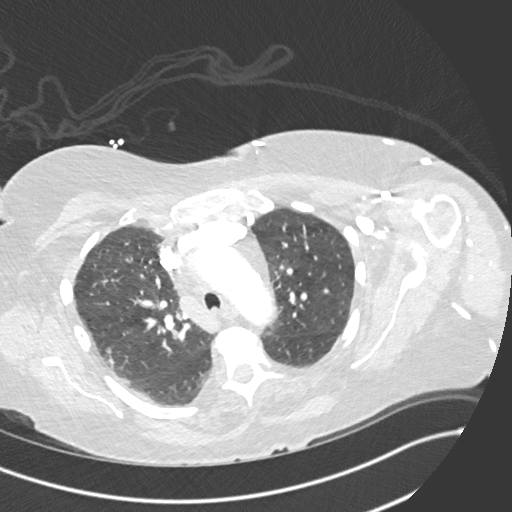
[im 332/443  mediastinal]
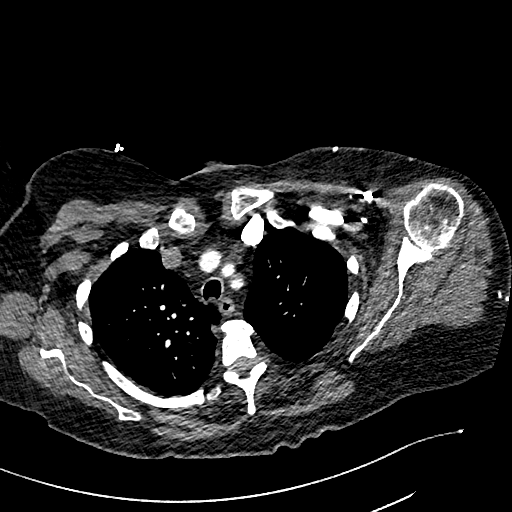
[im 354/443  lung]
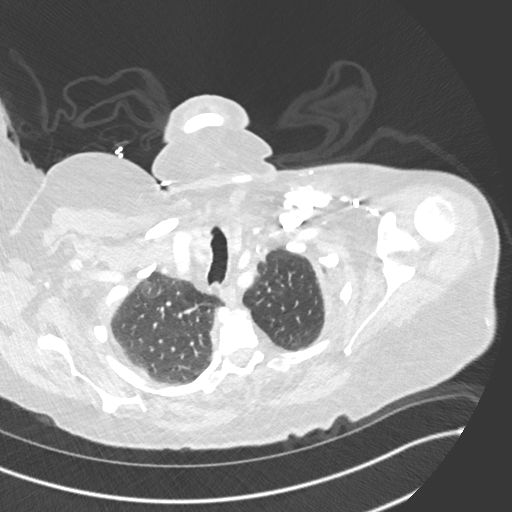
[im 398/443  mediastinal]
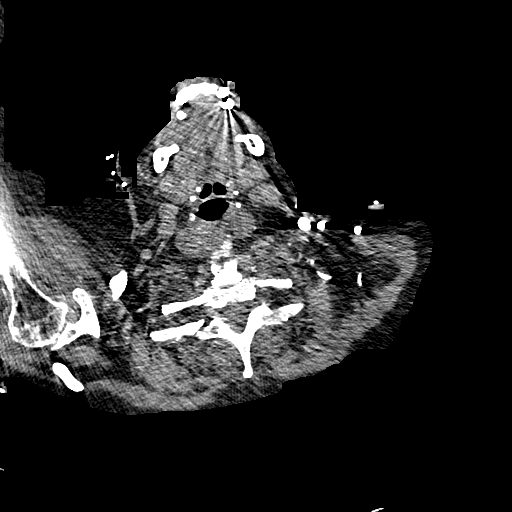
[im 420/443  lung]
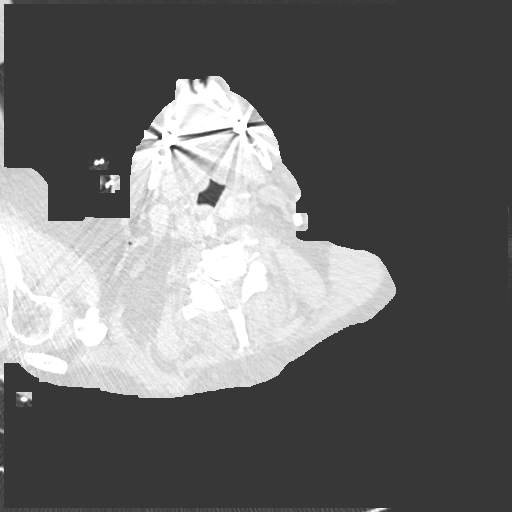

[Series 8: pe 2mm cor · coronal · 0.61mm/px · 1 of 151 slices shown]
[im 76/151  mediastinal]
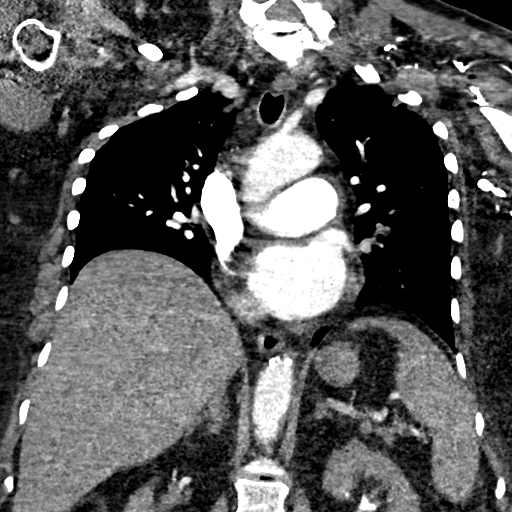

[16 of 36 positions shown; findings below may reference images not displayed]

FINDINGS: Cardiovascular: Satisfactory opacification the pulmonary arteries.
There are large bilateral pulmonary arteries extending from the
distal right and left main pulmonary arteries into the lobar and
segmental branches throughout both lungs. The overall degree of
embolic burden is similar to the comparison CT from 12/23/2019. Mild
central pulmonary artery enlargement and flattening of the
intraventricular septum is similar to the comparison. The degree of
right ventricular dilatation is perhaps mildly improved with
persistent elevation of the RV/LV ratio (1.4) to suggest residual
right heart strain. Coronary artery calcifications are present. No
pericardial effusion. Atherosclerotic plaque within the normal
caliber aorta. No acute luminal abnormality nor periaortic stranding
or hemorrhage. Normal 3 vessel branching of the aortic arch.
Proximal great vessels mildly tortuous but otherwise unremarkable
with a medialized course of the common carotid arteries. Major
venous structures are free of acute abnormality.

Mediastinum/Nodes: No mediastinal fluid or gas. Normal thyroid gland
and thoracic inlet. No acute abnormality of the trachea or
esophagus. No worrisome mediastinal, hilar or axillary adenopathy.

Lungs/Pleura: Low lung volumes basilar atelectatic changes and
additional areas of bandlike opacity likely reflecting further
subsegmental atelectasis and/or scarring. More focal subpleural
opacity in the lingula is increasingly confluent from the prior exam
and given the distribution of the pulmonary emboli, could reflect
developing infarcts. An additional similar area of subpleural
consolidated lung is present in the posterior segment right upper
lobe. No pneumothorax or effusion. No concerning pulmonary nodules
or masses.

Upper Abdomen: Gallbladder calcifications seen anti dependently
could reflect mural calcification or adherent gallstones with a
larger more convincing gallstone towards the gallbladder neck.
Prominent fold in the gallbladder neck as well. No biliary ductal
dilatation or calcified intraductal gallstones. No acute
abnormalities present in the visualized portions of the upper
abdomen.

Musculoskeletal: Exaggerated thoracic kyphosis with multilevel
discogenic and facet degenerative changes including close is several
midthoracic and lower thoracic vertebrae. No acute osseous
abnormality or suspicious osseous lesion. Stable appearance of a
subcutaneous cystic nodule along the right posterior chest wall
measuring up to 1.5 cm, contiguous with the dermis, likely
reflecting a small sebaceous/dermal inclusion cyst. No concerning
chest wall lesions. Mild body wall edema.

Review of the MIP images confirms the above findings.
IMPRESSION: 1. Large bilateral pulmonary arteries extending from the distal
right and left main pulmonary arteries into the lobar and segmental
branches throughout both lungs. The overall degree of embolic burden
is similar to the comparison CT from 12/23/2019. Favor most of the
embolic burden to be related to the initial insult though some acute
on subacute clot is not fully excluded.
2. The degree of right ventricular dilatation is perhaps mildly
improved with persistent elevation of the RV/LV ratio (1.4)
suggestive of residual right heart strain.
3. Increasingly confluent subpleural opacity in the peripheral
lingula and posterior segment right upper lobe, given distribution
of pulmonary emboli, could reflect developing pulmonary infarcts.
Infection less likely. Additional atelectatic changes throughout the
lungs.
4. Cholelithiasis without evidence of acute cholecystitis.
Additional mural calcifications versus adherent gallstones in the
more distal gallbladder. Could consider further characterization
with nonemergent right upper quadrant ultrasound.
5. Aortic Atherosclerosis ([id number]).

These results were called by telephone at the time of interpretation
on 12/27/2019 at [DATE] to provider Dr. Geary, who verbally
acknowledged these results.

## 2021-11-02 DIAGNOSIS — R413 Other amnesia: Secondary | ICD-10-CM | POA: Diagnosis not present

## 2021-11-02 DIAGNOSIS — R0600 Dyspnea, unspecified: Secondary | ICD-10-CM | POA: Diagnosis not present

## 2021-11-02 DIAGNOSIS — Z86711 Personal history of pulmonary embolism: Secondary | ICD-10-CM | POA: Diagnosis not present

## 2021-11-16 DIAGNOSIS — E119 Type 2 diabetes mellitus without complications: Secondary | ICD-10-CM | POA: Diagnosis not present

## 2021-11-16 DIAGNOSIS — I2699 Other pulmonary embolism without acute cor pulmonale: Secondary | ICD-10-CM | POA: Diagnosis not present

## 2021-11-16 DIAGNOSIS — R350 Frequency of micturition: Secondary | ICD-10-CM | POA: Diagnosis not present

## 2021-11-16 DIAGNOSIS — N8500 Endometrial hyperplasia, unspecified: Secondary | ICD-10-CM | POA: Diagnosis not present

## 2021-11-16 DIAGNOSIS — R5381 Other malaise: Secondary | ICD-10-CM | POA: Diagnosis not present

## 2021-11-16 DIAGNOSIS — I10 Essential (primary) hypertension: Secondary | ICD-10-CM | POA: Diagnosis not present

## 2021-11-16 DIAGNOSIS — R413 Other amnesia: Secondary | ICD-10-CM | POA: Diagnosis not present

## 2021-11-16 DIAGNOSIS — F32A Depression, unspecified: Secondary | ICD-10-CM | POA: Diagnosis not present

## 2021-11-16 DIAGNOSIS — R0600 Dyspnea, unspecified: Secondary | ICD-10-CM | POA: Diagnosis not present

## 2021-11-23 ENCOUNTER — Ambulatory Visit: Payer: Medicare HMO | Admitting: Pulmonary Disease

## 2021-11-24 DIAGNOSIS — E119 Type 2 diabetes mellitus without complications: Secondary | ICD-10-CM | POA: Diagnosis not present

## 2021-11-25 DIAGNOSIS — E119 Type 2 diabetes mellitus without complications: Secondary | ICD-10-CM | POA: Diagnosis not present

## 2021-12-05 DIAGNOSIS — R0600 Dyspnea, unspecified: Secondary | ICD-10-CM | POA: Diagnosis not present

## 2021-12-07 DIAGNOSIS — R0600 Dyspnea, unspecified: Secondary | ICD-10-CM | POA: Diagnosis not present

## 2021-12-13 IMAGING — CT CT ABD-PELV W/ CM
2 of 5 series · 12 of 46 positions shown, 14 images · IV contrast (iopamidol)
Comparison: CT chest 12/27/2019

CLINICAL DATA: Nausea and vomiting, upper abdominal pain

EXAM:
CT ABDOMEN AND PELVIS WITH CONTRAST
TECHNIQUE: Multidetector CT imaging of the abdomen and pelvis was performed
using the standard protocol following bolus administration of
intravenous contrast.
CONTRAST:  80mL [id number] IOPAMIDOL ([id number]) INJECTION 61%

[Series 2: abd pelvis 5.00 br40 s3 axial · axial · 0.67mm/px · z∈[+1218,+1538]mm · 9 of 82 slices shown, 11 images]
[im 9/82  soft-tissue]
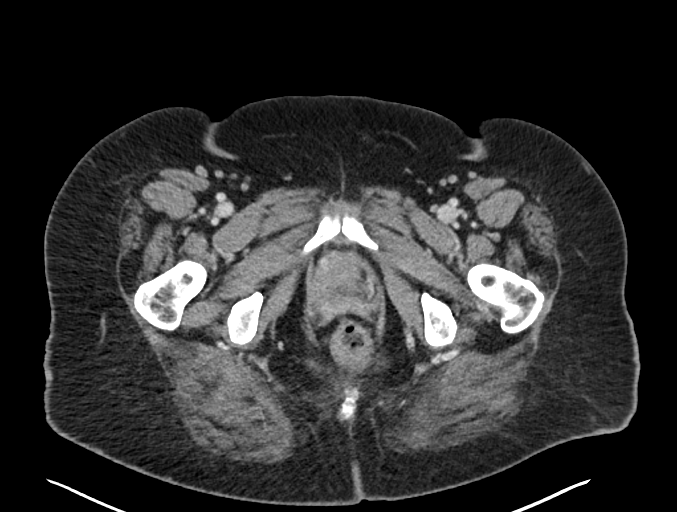
[im 9/82  bone]
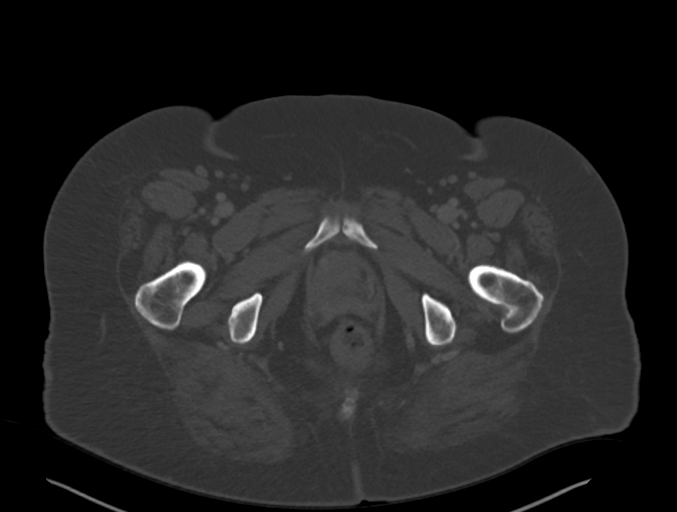
[im 17/82  soft-tissue]
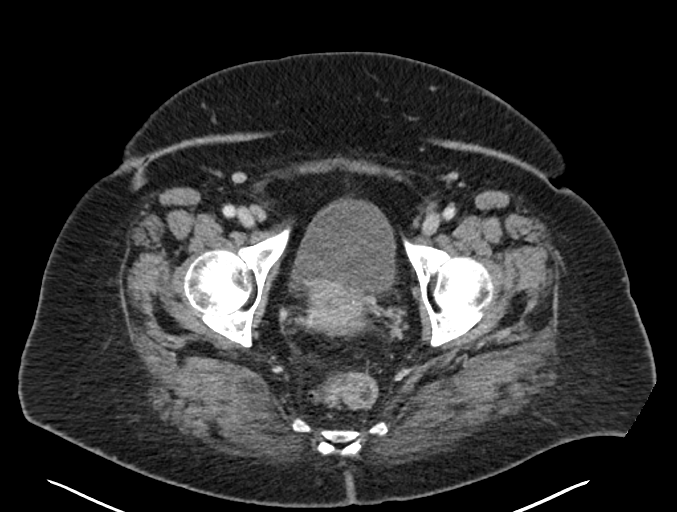
[im 25/82  soft-tissue]
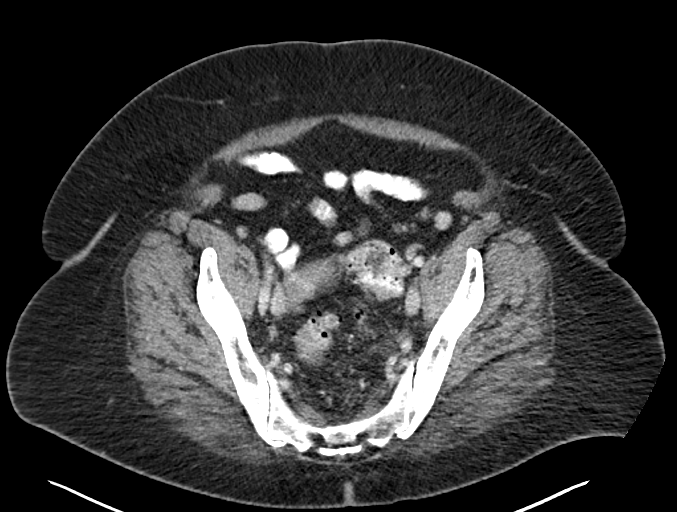
[im 33/82  soft-tissue]
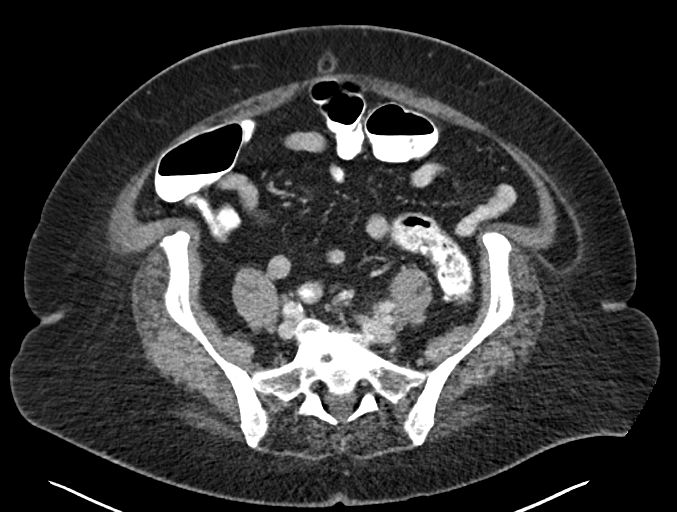
[im 41/82  soft-tissue]
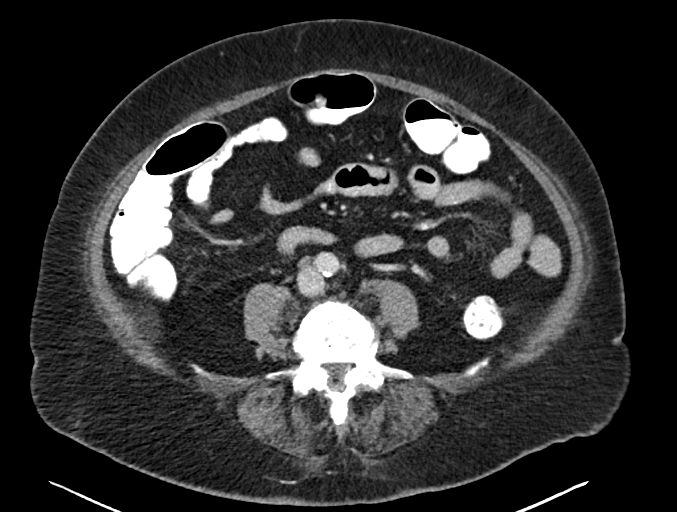
[im 49/82  soft-tissue]
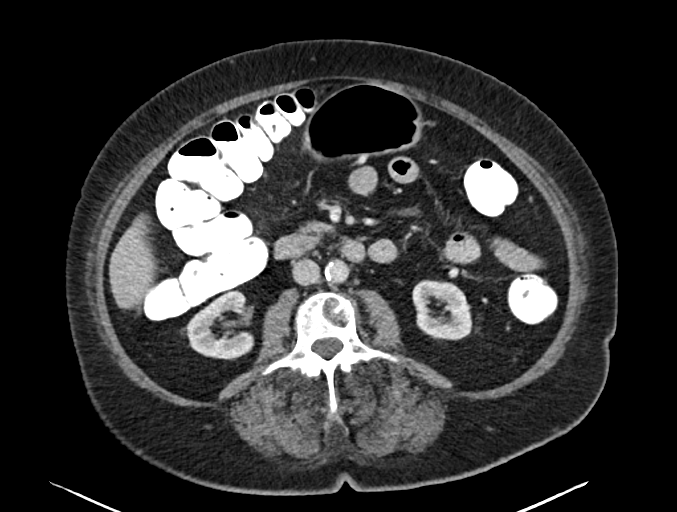
[im 57/82  soft-tissue]
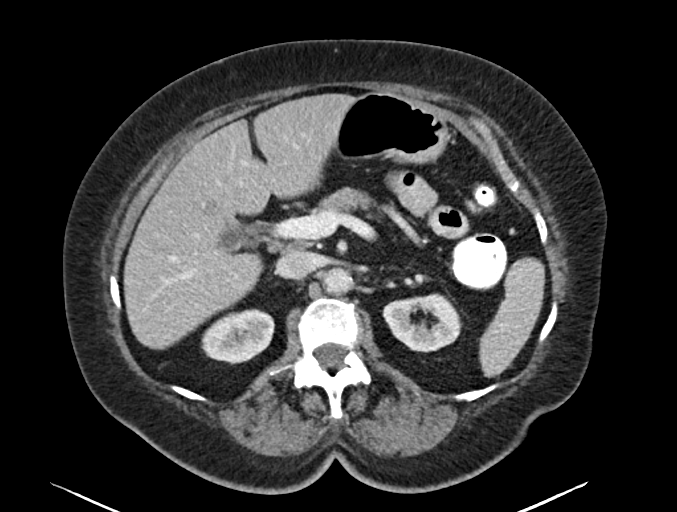
[im 65/82  soft-tissue]
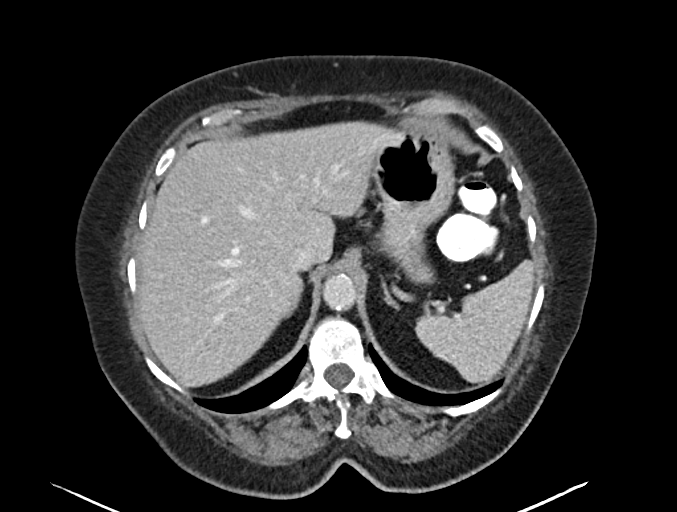
[im 73/82  soft-tissue]
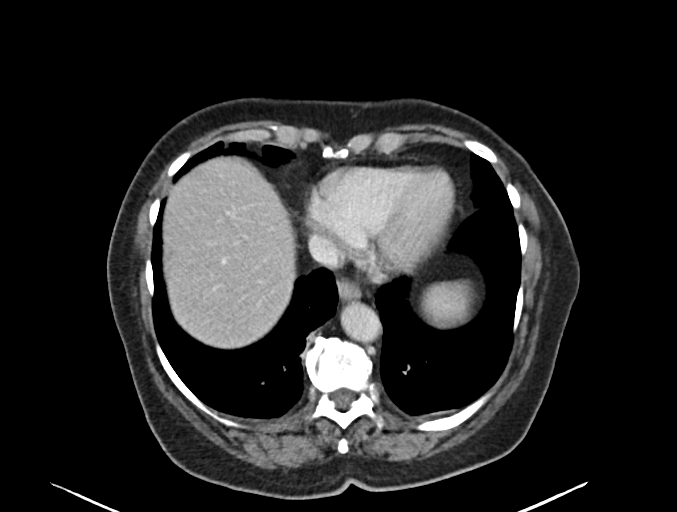
[im 73/82  bone]
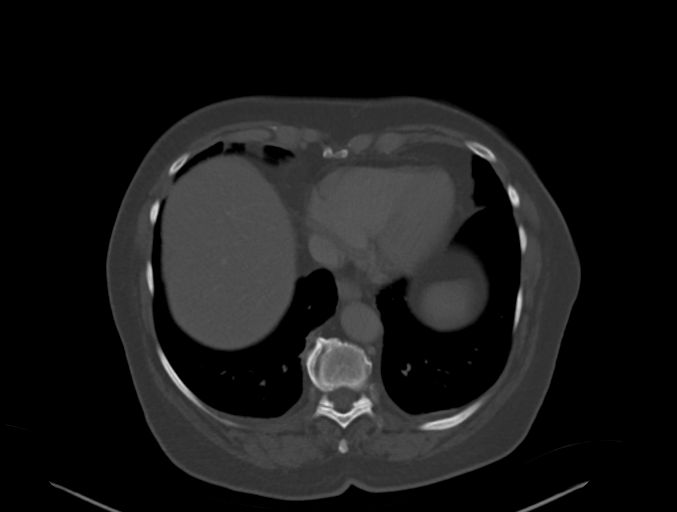

[Series 6: abd pelvis 2.00 br40 s3 cor · coronal · 0.80mm/px · 3 of 166 slices shown]
[im 56/166  soft-tissue]
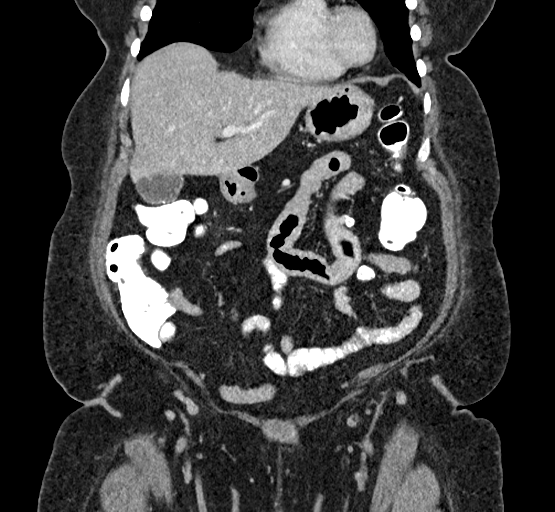
[im 74/166  soft-tissue]
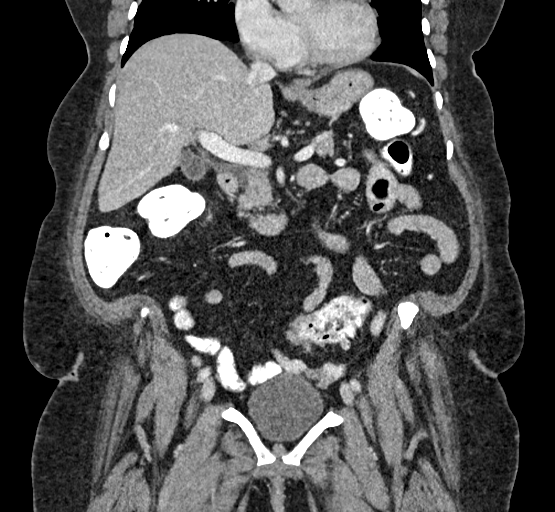
[im 92/166  soft-tissue]
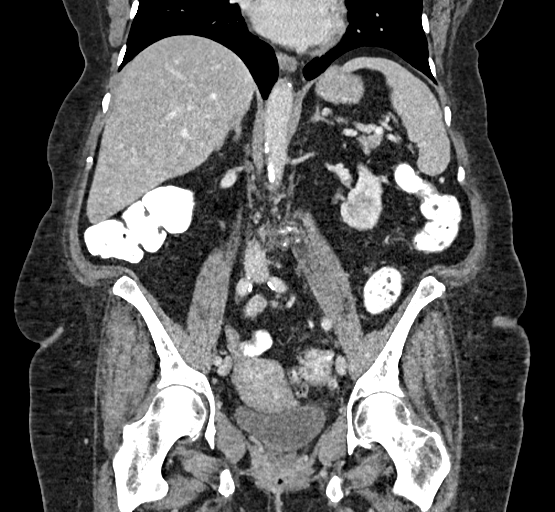

[12 of 46 positions shown; findings below may reference images not displayed]

FINDINGS: Lower chest: Visualized lung bases are clear.  Heart size is normal.

Hepatobiliary: Suspect mural calcifications within the gallbladder
wall, similar in appearance to the previous study. No focal hepatic
lesion is identified. No biliary dilatation.

Pancreas: Unremarkable. No pancreatic ductal dilatation or
surrounding inflammatory changes.

Spleen: Normal in size without focal abnormality.

Adrenals/Urinary Tract: Unremarkable adrenal glands. Kidneys enhance
symmetrically without focal lesion, stone, or hydronephrosis.
Ureters are nondilated. Urinary bladder appears unremarkable.

Stomach/Bowel: Stomach is within normal limits. Colon is well
distended with oral contrast. There is sigmoid diverticulosis with
mildly thickened short segment of rectosigmoid colon (series 2,
image 62), which may reflect sequela of chronic diverticulitis. No
evidence of active colonic inflammation. No dilated loops of bowel
to suggest obstruction.

Vascular/Lymphatic: Scattered aortoiliac atherosclerotic
calcifications without aneurysm. No abdominopelvic lymphadenopathy.

Reproductive: Uterus and bilateral adnexa are unremarkable.

Other: No free fluid. No abdominopelvic fluid collection. No
pneumoperitoneum. No abdominal wall hernia.

Musculoskeletal: Thoracolumbar degenerative changes with fusion of
the T11-12 and L5-S1 levels. There is grade 1 anterolisthesis of L4
on L5. Rounded non expansile lucent lesion within the L3 vertebral
body measuring 2.2 cm, possibly reflecting hemangioma. No additional
osseous lesion identified. Stable appearance of probable sebaceous
cyst in the posterior right chest wall/right flank region (series 2,
image 20).
IMPRESSION: 1. No acute abdominopelvic findings.
2. Sigmoid diverticulosis with mildly thickened short segment of
rectosigmoid colon, which may reflect sequela of chronic
diverticulitis. No evidence of active colonic inflammation.
Underlying colonic malignancy at this location would be difficult to
exclude. Consider correlation with direct visualization, if
colonoscopy not recently performed.
3. Suspected mural calcifications within the gallbladder wall with a
partial porcelain gallbladder appearance, similar in appearance to
the previous study. No specific follow-up recommendations for this
finding. Reference: [HOSPITAL] 6994;[DATE].
4. Rounded non-expansile lucent lesion within the L3 vertebral body
measuring 2.2 cm, possibly representing a hemangioma. Correlation
with prior outside imaging of the spine is suggested to assess for
interval change.
5. Aortic atherosclerosis. ([id number].G).

## 2021-12-19 DIAGNOSIS — R0600 Dyspnea, unspecified: Secondary | ICD-10-CM | POA: Diagnosis not present

## 2022-01-09 DIAGNOSIS — R0600 Dyspnea, unspecified: Secondary | ICD-10-CM | POA: Diagnosis not present

## 2022-01-09 DIAGNOSIS — J181 Lobar pneumonia, unspecified organism: Secondary | ICD-10-CM | POA: Diagnosis not present

## 2022-01-09 DIAGNOSIS — R911 Solitary pulmonary nodule: Secondary | ICD-10-CM | POA: Diagnosis not present

## 2022-01-09 DIAGNOSIS — I700 Atherosclerosis of aorta: Secondary | ICD-10-CM | POA: Diagnosis not present

## 2022-01-09 DIAGNOSIS — I2510 Atherosclerotic heart disease of native coronary artery without angina pectoris: Secondary | ICD-10-CM | POA: Diagnosis not present

## 2022-01-17 DIAGNOSIS — N950 Postmenopausal bleeding: Secondary | ICD-10-CM | POA: Diagnosis not present

## 2022-01-17 DIAGNOSIS — R9389 Abnormal findings on diagnostic imaging of other specified body structures: Secondary | ICD-10-CM | POA: Diagnosis not present

## 2022-02-13 DIAGNOSIS — R0600 Dyspnea, unspecified: Secondary | ICD-10-CM | POA: Diagnosis not present

## 2022-02-13 DIAGNOSIS — R6889 Other general symptoms and signs: Secondary | ICD-10-CM | POA: Diagnosis not present

## 2022-02-13 DIAGNOSIS — I2510 Atherosclerotic heart disease of native coronary artery without angina pectoris: Secondary | ICD-10-CM | POA: Diagnosis not present

## 2022-02-15 DIAGNOSIS — F32A Depression, unspecified: Secondary | ICD-10-CM | POA: Diagnosis not present

## 2022-02-15 DIAGNOSIS — N8500 Endometrial hyperplasia, unspecified: Secondary | ICD-10-CM | POA: Diagnosis not present

## 2022-02-15 DIAGNOSIS — R0600 Dyspnea, unspecified: Secondary | ICD-10-CM | POA: Diagnosis not present

## 2022-02-15 DIAGNOSIS — E119 Type 2 diabetes mellitus without complications: Secondary | ICD-10-CM | POA: Diagnosis not present

## 2022-02-15 DIAGNOSIS — R5381 Other malaise: Secondary | ICD-10-CM | POA: Diagnosis not present

## 2022-02-15 DIAGNOSIS — R413 Other amnesia: Secondary | ICD-10-CM | POA: Diagnosis not present

## 2022-03-01 DIAGNOSIS — R0602 Shortness of breath: Secondary | ICD-10-CM | POA: Diagnosis not present

## 2022-03-07 DIAGNOSIS — R6889 Other general symptoms and signs: Secondary | ICD-10-CM | POA: Diagnosis not present

## 2022-03-07 DIAGNOSIS — R0600 Dyspnea, unspecified: Secondary | ICD-10-CM | POA: Diagnosis not present

## 2022-03-07 DIAGNOSIS — I2510 Atherosclerotic heart disease of native coronary artery without angina pectoris: Secondary | ICD-10-CM | POA: Diagnosis not present

## 2022-03-15 DIAGNOSIS — R2681 Unsteadiness on feet: Secondary | ICD-10-CM | POA: Diagnosis not present

## 2022-03-15 DIAGNOSIS — R5383 Other fatigue: Secondary | ICD-10-CM | POA: Diagnosis not present

## 2022-03-15 DIAGNOSIS — M25661 Stiffness of right knee, not elsewhere classified: Secondary | ICD-10-CM | POA: Diagnosis not present

## 2022-03-15 DIAGNOSIS — R5381 Other malaise: Secondary | ICD-10-CM | POA: Diagnosis not present

## 2022-03-15 DIAGNOSIS — M6281 Muscle weakness (generalized): Secondary | ICD-10-CM | POA: Diagnosis not present

## 2022-03-15 DIAGNOSIS — M25662 Stiffness of left knee, not elsewhere classified: Secondary | ICD-10-CM | POA: Diagnosis not present

## 2022-03-15 DIAGNOSIS — R293 Abnormal posture: Secondary | ICD-10-CM | POA: Diagnosis not present

## 2022-03-20 DIAGNOSIS — R5381 Other malaise: Secondary | ICD-10-CM | POA: Diagnosis not present

## 2022-03-20 DIAGNOSIS — M25662 Stiffness of left knee, not elsewhere classified: Secondary | ICD-10-CM | POA: Diagnosis not present

## 2022-03-20 DIAGNOSIS — M6281 Muscle weakness (generalized): Secondary | ICD-10-CM | POA: Diagnosis not present

## 2022-03-20 DIAGNOSIS — M25661 Stiffness of right knee, not elsewhere classified: Secondary | ICD-10-CM | POA: Diagnosis not present

## 2022-03-20 DIAGNOSIS — R5383 Other fatigue: Secondary | ICD-10-CM | POA: Diagnosis not present

## 2022-03-20 DIAGNOSIS — R293 Abnormal posture: Secondary | ICD-10-CM | POA: Diagnosis not present

## 2022-03-20 DIAGNOSIS — R2681 Unsteadiness on feet: Secondary | ICD-10-CM | POA: Diagnosis not present

## 2022-03-22 DIAGNOSIS — M25662 Stiffness of left knee, not elsewhere classified: Secondary | ICD-10-CM | POA: Diagnosis not present

## 2022-03-22 DIAGNOSIS — R2681 Unsteadiness on feet: Secondary | ICD-10-CM | POA: Diagnosis not present

## 2022-03-22 DIAGNOSIS — M6281 Muscle weakness (generalized): Secondary | ICD-10-CM | POA: Diagnosis not present

## 2022-03-22 DIAGNOSIS — M25661 Stiffness of right knee, not elsewhere classified: Secondary | ICD-10-CM | POA: Diagnosis not present

## 2022-03-22 DIAGNOSIS — R5381 Other malaise: Secondary | ICD-10-CM | POA: Diagnosis not present

## 2022-03-22 DIAGNOSIS — R5383 Other fatigue: Secondary | ICD-10-CM | POA: Diagnosis not present

## 2022-03-22 DIAGNOSIS — R293 Abnormal posture: Secondary | ICD-10-CM | POA: Diagnosis not present

## 2022-03-27 DIAGNOSIS — M25661 Stiffness of right knee, not elsewhere classified: Secondary | ICD-10-CM | POA: Diagnosis not present

## 2022-03-27 DIAGNOSIS — M6281 Muscle weakness (generalized): Secondary | ICD-10-CM | POA: Diagnosis not present

## 2022-03-27 DIAGNOSIS — R2681 Unsteadiness on feet: Secondary | ICD-10-CM | POA: Diagnosis not present

## 2022-03-27 DIAGNOSIS — R5383 Other fatigue: Secondary | ICD-10-CM | POA: Diagnosis not present

## 2022-03-27 DIAGNOSIS — M25662 Stiffness of left knee, not elsewhere classified: Secondary | ICD-10-CM | POA: Diagnosis not present

## 2022-03-27 DIAGNOSIS — R293 Abnormal posture: Secondary | ICD-10-CM | POA: Diagnosis not present

## 2022-03-27 DIAGNOSIS — R5381 Other malaise: Secondary | ICD-10-CM | POA: Diagnosis not present

## 2022-03-29 DIAGNOSIS — R5381 Other malaise: Secondary | ICD-10-CM | POA: Diagnosis not present

## 2022-03-29 DIAGNOSIS — M25662 Stiffness of left knee, not elsewhere classified: Secondary | ICD-10-CM | POA: Diagnosis not present

## 2022-03-29 DIAGNOSIS — M25661 Stiffness of right knee, not elsewhere classified: Secondary | ICD-10-CM | POA: Diagnosis not present

## 2022-03-29 DIAGNOSIS — R293 Abnormal posture: Secondary | ICD-10-CM | POA: Diagnosis not present

## 2022-03-29 DIAGNOSIS — M6281 Muscle weakness (generalized): Secondary | ICD-10-CM | POA: Diagnosis not present

## 2022-03-29 DIAGNOSIS — R2681 Unsteadiness on feet: Secondary | ICD-10-CM | POA: Diagnosis not present

## 2022-03-29 DIAGNOSIS — R5383 Other fatigue: Secondary | ICD-10-CM | POA: Diagnosis not present

## 2022-04-03 DIAGNOSIS — M6281 Muscle weakness (generalized): Secondary | ICD-10-CM | POA: Diagnosis not present

## 2022-04-03 DIAGNOSIS — R293 Abnormal posture: Secondary | ICD-10-CM | POA: Diagnosis not present

## 2022-04-03 DIAGNOSIS — R2681 Unsteadiness on feet: Secondary | ICD-10-CM | POA: Diagnosis not present

## 2022-04-03 DIAGNOSIS — M25661 Stiffness of right knee, not elsewhere classified: Secondary | ICD-10-CM | POA: Diagnosis not present

## 2022-04-03 DIAGNOSIS — M25662 Stiffness of left knee, not elsewhere classified: Secondary | ICD-10-CM | POA: Diagnosis not present

## 2022-04-03 DIAGNOSIS — R5381 Other malaise: Secondary | ICD-10-CM | POA: Diagnosis not present

## 2022-04-03 DIAGNOSIS — R5383 Other fatigue: Secondary | ICD-10-CM | POA: Diagnosis not present

## 2022-04-07 DIAGNOSIS — R0600 Dyspnea, unspecified: Secondary | ICD-10-CM | POA: Diagnosis not present

## 2022-04-07 DIAGNOSIS — E119 Type 2 diabetes mellitus without complications: Secondary | ICD-10-CM | POA: Diagnosis not present

## 2022-04-07 DIAGNOSIS — N8500 Endometrial hyperplasia, unspecified: Secondary | ICD-10-CM | POA: Diagnosis not present

## 2022-04-07 DIAGNOSIS — F32A Depression, unspecified: Secondary | ICD-10-CM | POA: Diagnosis not present

## 2022-04-07 DIAGNOSIS — R5381 Other malaise: Secondary | ICD-10-CM | POA: Diagnosis not present

## 2022-04-07 DIAGNOSIS — I2699 Other pulmonary embolism without acute cor pulmonale: Secondary | ICD-10-CM | POA: Diagnosis not present

## 2022-04-07 DIAGNOSIS — R413 Other amnesia: Secondary | ICD-10-CM | POA: Diagnosis not present

## 2022-04-10 DIAGNOSIS — R2681 Unsteadiness on feet: Secondary | ICD-10-CM | POA: Diagnosis not present

## 2022-04-10 DIAGNOSIS — R5383 Other fatigue: Secondary | ICD-10-CM | POA: Diagnosis not present

## 2022-04-10 DIAGNOSIS — M25662 Stiffness of left knee, not elsewhere classified: Secondary | ICD-10-CM | POA: Diagnosis not present

## 2022-04-10 DIAGNOSIS — R293 Abnormal posture: Secondary | ICD-10-CM | POA: Diagnosis not present

## 2022-04-10 DIAGNOSIS — M6281 Muscle weakness (generalized): Secondary | ICD-10-CM | POA: Diagnosis not present

## 2022-04-10 DIAGNOSIS — R5381 Other malaise: Secondary | ICD-10-CM | POA: Diagnosis not present

## 2022-04-10 DIAGNOSIS — M25661 Stiffness of right knee, not elsewhere classified: Secondary | ICD-10-CM | POA: Diagnosis not present

## 2022-04-12 DIAGNOSIS — M25662 Stiffness of left knee, not elsewhere classified: Secondary | ICD-10-CM | POA: Diagnosis not present

## 2022-04-12 DIAGNOSIS — R5381 Other malaise: Secondary | ICD-10-CM | POA: Diagnosis not present

## 2022-04-12 DIAGNOSIS — R5383 Other fatigue: Secondary | ICD-10-CM | POA: Diagnosis not present

## 2022-04-12 DIAGNOSIS — M25661 Stiffness of right knee, not elsewhere classified: Secondary | ICD-10-CM | POA: Diagnosis not present

## 2022-04-12 DIAGNOSIS — R293 Abnormal posture: Secondary | ICD-10-CM | POA: Diagnosis not present

## 2022-04-12 DIAGNOSIS — M6281 Muscle weakness (generalized): Secondary | ICD-10-CM | POA: Diagnosis not present

## 2022-04-12 DIAGNOSIS — R2681 Unsteadiness on feet: Secondary | ICD-10-CM | POA: Diagnosis not present

## 2022-04-17 DIAGNOSIS — M6281 Muscle weakness (generalized): Secondary | ICD-10-CM | POA: Diagnosis not present

## 2022-04-17 DIAGNOSIS — R5381 Other malaise: Secondary | ICD-10-CM | POA: Diagnosis not present

## 2022-04-17 DIAGNOSIS — R2681 Unsteadiness on feet: Secondary | ICD-10-CM | POA: Diagnosis not present

## 2022-04-17 DIAGNOSIS — M25661 Stiffness of right knee, not elsewhere classified: Secondary | ICD-10-CM | POA: Diagnosis not present

## 2022-04-17 DIAGNOSIS — R5383 Other fatigue: Secondary | ICD-10-CM | POA: Diagnosis not present

## 2022-04-17 DIAGNOSIS — R293 Abnormal posture: Secondary | ICD-10-CM | POA: Diagnosis not present

## 2022-04-17 DIAGNOSIS — M25662 Stiffness of left knee, not elsewhere classified: Secondary | ICD-10-CM | POA: Diagnosis not present

## 2022-04-19 DIAGNOSIS — R293 Abnormal posture: Secondary | ICD-10-CM | POA: Diagnosis not present

## 2022-04-19 DIAGNOSIS — M6281 Muscle weakness (generalized): Secondary | ICD-10-CM | POA: Diagnosis not present

## 2022-04-19 DIAGNOSIS — R5381 Other malaise: Secondary | ICD-10-CM | POA: Diagnosis not present

## 2022-04-19 DIAGNOSIS — M25662 Stiffness of left knee, not elsewhere classified: Secondary | ICD-10-CM | POA: Diagnosis not present

## 2022-04-19 DIAGNOSIS — M25661 Stiffness of right knee, not elsewhere classified: Secondary | ICD-10-CM | POA: Diagnosis not present

## 2022-04-19 DIAGNOSIS — R2681 Unsteadiness on feet: Secondary | ICD-10-CM | POA: Diagnosis not present

## 2022-04-19 DIAGNOSIS — R5383 Other fatigue: Secondary | ICD-10-CM | POA: Diagnosis not present

## 2022-04-21 DIAGNOSIS — I2699 Other pulmonary embolism without acute cor pulmonale: Secondary | ICD-10-CM | POA: Diagnosis not present

## 2022-04-21 DIAGNOSIS — E119 Type 2 diabetes mellitus without complications: Secondary | ICD-10-CM | POA: Diagnosis not present

## 2022-04-24 DIAGNOSIS — R2681 Unsteadiness on feet: Secondary | ICD-10-CM | POA: Diagnosis not present

## 2022-04-24 DIAGNOSIS — R5383 Other fatigue: Secondary | ICD-10-CM | POA: Diagnosis not present

## 2022-04-24 DIAGNOSIS — R5381 Other malaise: Secondary | ICD-10-CM | POA: Diagnosis not present

## 2022-04-24 DIAGNOSIS — R293 Abnormal posture: Secondary | ICD-10-CM | POA: Diagnosis not present

## 2022-04-24 DIAGNOSIS — M25661 Stiffness of right knee, not elsewhere classified: Secondary | ICD-10-CM | POA: Diagnosis not present

## 2022-04-24 DIAGNOSIS — M25662 Stiffness of left knee, not elsewhere classified: Secondary | ICD-10-CM | POA: Diagnosis not present

## 2022-04-24 DIAGNOSIS — M6281 Muscle weakness (generalized): Secondary | ICD-10-CM | POA: Diagnosis not present

## 2022-04-26 DIAGNOSIS — R2681 Unsteadiness on feet: Secondary | ICD-10-CM | POA: Diagnosis not present

## 2022-04-26 DIAGNOSIS — R5383 Other fatigue: Secondary | ICD-10-CM | POA: Diagnosis not present

## 2022-04-26 DIAGNOSIS — M6281 Muscle weakness (generalized): Secondary | ICD-10-CM | POA: Diagnosis not present

## 2022-04-26 DIAGNOSIS — R293 Abnormal posture: Secondary | ICD-10-CM | POA: Diagnosis not present

## 2022-04-26 DIAGNOSIS — M25662 Stiffness of left knee, not elsewhere classified: Secondary | ICD-10-CM | POA: Diagnosis not present

## 2022-04-26 DIAGNOSIS — M25661 Stiffness of right knee, not elsewhere classified: Secondary | ICD-10-CM | POA: Diagnosis not present

## 2022-04-26 DIAGNOSIS — R5381 Other malaise: Secondary | ICD-10-CM | POA: Diagnosis not present

## 2022-05-01 DIAGNOSIS — R5381 Other malaise: Secondary | ICD-10-CM | POA: Diagnosis not present

## 2022-05-01 DIAGNOSIS — M25662 Stiffness of left knee, not elsewhere classified: Secondary | ICD-10-CM | POA: Diagnosis not present

## 2022-05-01 DIAGNOSIS — R293 Abnormal posture: Secondary | ICD-10-CM | POA: Diagnosis not present

## 2022-05-01 DIAGNOSIS — M25661 Stiffness of right knee, not elsewhere classified: Secondary | ICD-10-CM | POA: Diagnosis not present

## 2022-05-01 DIAGNOSIS — R2681 Unsteadiness on feet: Secondary | ICD-10-CM | POA: Diagnosis not present

## 2022-05-01 DIAGNOSIS — R5383 Other fatigue: Secondary | ICD-10-CM | POA: Diagnosis not present

## 2022-05-01 DIAGNOSIS — M6281 Muscle weakness (generalized): Secondary | ICD-10-CM | POA: Diagnosis not present

## 2022-05-03 DIAGNOSIS — M25661 Stiffness of right knee, not elsewhere classified: Secondary | ICD-10-CM | POA: Diagnosis not present

## 2022-05-03 DIAGNOSIS — R5381 Other malaise: Secondary | ICD-10-CM | POA: Diagnosis not present

## 2022-05-03 DIAGNOSIS — M25662 Stiffness of left knee, not elsewhere classified: Secondary | ICD-10-CM | POA: Diagnosis not present

## 2022-05-03 DIAGNOSIS — R2681 Unsteadiness on feet: Secondary | ICD-10-CM | POA: Diagnosis not present

## 2022-05-03 DIAGNOSIS — R5383 Other fatigue: Secondary | ICD-10-CM | POA: Diagnosis not present

## 2022-05-03 DIAGNOSIS — M6281 Muscle weakness (generalized): Secondary | ICD-10-CM | POA: Diagnosis not present

## 2022-05-03 DIAGNOSIS — R293 Abnormal posture: Secondary | ICD-10-CM | POA: Diagnosis not present

## 2022-05-08 DIAGNOSIS — R5383 Other fatigue: Secondary | ICD-10-CM | POA: Diagnosis not present

## 2022-05-08 DIAGNOSIS — M25662 Stiffness of left knee, not elsewhere classified: Secondary | ICD-10-CM | POA: Diagnosis not present

## 2022-05-08 DIAGNOSIS — R293 Abnormal posture: Secondary | ICD-10-CM | POA: Diagnosis not present

## 2022-05-08 DIAGNOSIS — M25661 Stiffness of right knee, not elsewhere classified: Secondary | ICD-10-CM | POA: Diagnosis not present

## 2022-05-08 DIAGNOSIS — R2681 Unsteadiness on feet: Secondary | ICD-10-CM | POA: Diagnosis not present

## 2022-05-08 DIAGNOSIS — R5381 Other malaise: Secondary | ICD-10-CM | POA: Diagnosis not present

## 2022-05-08 DIAGNOSIS — M6281 Muscle weakness (generalized): Secondary | ICD-10-CM | POA: Diagnosis not present

## 2022-05-10 DIAGNOSIS — M25661 Stiffness of right knee, not elsewhere classified: Secondary | ICD-10-CM | POA: Diagnosis not present

## 2022-05-10 DIAGNOSIS — M25662 Stiffness of left knee, not elsewhere classified: Secondary | ICD-10-CM | POA: Diagnosis not present

## 2022-05-10 DIAGNOSIS — M6281 Muscle weakness (generalized): Secondary | ICD-10-CM | POA: Diagnosis not present

## 2022-05-10 DIAGNOSIS — R5381 Other malaise: Secondary | ICD-10-CM | POA: Diagnosis not present

## 2022-05-10 DIAGNOSIS — R293 Abnormal posture: Secondary | ICD-10-CM | POA: Diagnosis not present

## 2022-05-10 DIAGNOSIS — R5383 Other fatigue: Secondary | ICD-10-CM | POA: Diagnosis not present

## 2022-05-10 DIAGNOSIS — R2681 Unsteadiness on feet: Secondary | ICD-10-CM | POA: Diagnosis not present

## 2022-06-12 DIAGNOSIS — R9389 Abnormal findings on diagnostic imaging of other specified body structures: Secondary | ICD-10-CM | POA: Diagnosis not present

## 2022-06-12 DIAGNOSIS — N8500 Endometrial hyperplasia, unspecified: Secondary | ICD-10-CM | POA: Diagnosis not present

## 2022-07-19 DIAGNOSIS — R635 Abnormal weight gain: Secondary | ICD-10-CM | POA: Diagnosis not present

## 2022-07-19 DIAGNOSIS — E119 Type 2 diabetes mellitus without complications: Secondary | ICD-10-CM | POA: Diagnosis not present

## 2022-07-19 DIAGNOSIS — N8500 Endometrial hyperplasia, unspecified: Secondary | ICD-10-CM | POA: Diagnosis not present

## 2022-07-19 DIAGNOSIS — I2699 Other pulmonary embolism without acute cor pulmonale: Secondary | ICD-10-CM | POA: Diagnosis not present

## 2022-07-19 DIAGNOSIS — R413 Other amnesia: Secondary | ICD-10-CM | POA: Diagnosis not present

## 2022-07-19 DIAGNOSIS — R609 Edema, unspecified: Secondary | ICD-10-CM | POA: Diagnosis not present

## 2022-07-19 DIAGNOSIS — F32A Depression, unspecified: Secondary | ICD-10-CM | POA: Diagnosis not present

## 2022-07-19 DIAGNOSIS — R5381 Other malaise: Secondary | ICD-10-CM | POA: Diagnosis not present

## 2022-07-19 DIAGNOSIS — R0600 Dyspnea, unspecified: Secondary | ICD-10-CM | POA: Diagnosis not present

## 2022-08-02 DIAGNOSIS — R7989 Other specified abnormal findings of blood chemistry: Secondary | ICD-10-CM | POA: Diagnosis not present

## 2022-08-17 DIAGNOSIS — R2681 Unsteadiness on feet: Secondary | ICD-10-CM | POA: Diagnosis not present

## 2022-08-17 DIAGNOSIS — R293 Abnormal posture: Secondary | ICD-10-CM | POA: Diagnosis not present

## 2022-08-17 DIAGNOSIS — R5381 Other malaise: Secondary | ICD-10-CM | POA: Diagnosis not present

## 2022-08-17 DIAGNOSIS — M25662 Stiffness of left knee, not elsewhere classified: Secondary | ICD-10-CM | POA: Diagnosis not present

## 2022-08-17 DIAGNOSIS — R5383 Other fatigue: Secondary | ICD-10-CM | POA: Diagnosis not present

## 2022-08-17 DIAGNOSIS — M6281 Muscle weakness (generalized): Secondary | ICD-10-CM | POA: Diagnosis not present

## 2022-08-17 DIAGNOSIS — M25661 Stiffness of right knee, not elsewhere classified: Secondary | ICD-10-CM | POA: Diagnosis not present

## 2022-08-25 DIAGNOSIS — N8500 Endometrial hyperplasia, unspecified: Secondary | ICD-10-CM | POA: Diagnosis not present

## 2022-08-31 ENCOUNTER — Emergency Department (HOSPITAL_BASED_OUTPATIENT_CLINIC_OR_DEPARTMENT_OTHER): Payer: Medicare HMO

## 2022-08-31 ENCOUNTER — Emergency Department (HOSPITAL_BASED_OUTPATIENT_CLINIC_OR_DEPARTMENT_OTHER)
Admission: EM | Admit: 2022-08-31 | Discharge: 2022-09-01 | Disposition: A | Discharge status: Home / Self Care | Payer: Medicare HMO | Attending: Emergency Medicine | Admitting: Emergency Medicine

## 2022-08-31 ENCOUNTER — Other Ambulatory Visit: Payer: Self-pay

## 2022-08-31 DIAGNOSIS — R4182 Altered mental status, unspecified: Secondary | ICD-10-CM | POA: Diagnosis not present

## 2022-08-31 DIAGNOSIS — M79604 Pain in right leg: Secondary | ICD-10-CM | POA: Diagnosis not present

## 2022-08-31 DIAGNOSIS — Z7901 Long term (current) use of anticoagulants: Secondary | ICD-10-CM | POA: Insufficient documentation

## 2022-08-31 DIAGNOSIS — R410 Disorientation, unspecified: Secondary | ICD-10-CM | POA: Diagnosis not present

## 2022-08-31 DIAGNOSIS — K8020 Calculus of gallbladder without cholecystitis without obstruction: Secondary | ICD-10-CM | POA: Diagnosis not present

## 2022-08-31 DIAGNOSIS — N3000 Acute cystitis without hematuria: Secondary | ICD-10-CM | POA: Diagnosis not present

## 2022-08-31 DIAGNOSIS — F0390 Unspecified dementia without behavioral disturbance: Secondary | ICD-10-CM | POA: Diagnosis not present

## 2022-08-31 DIAGNOSIS — R0602 Shortness of breath: Secondary | ICD-10-CM | POA: Diagnosis not present

## 2022-08-31 DIAGNOSIS — M79651 Pain in right thigh: Secondary | ICD-10-CM | POA: Insufficient documentation

## 2022-08-31 LAB — BASIC METABOLIC PANEL
Anion gap: 7 (ref 5–15)
BUN: 33 mg/dL — ABNORMAL HIGH (ref 8–23)
CO2: 22 mmol/L (ref 22–32)
Calcium: 9.2 mg/dL (ref 8.9–10.3)
Chloride: 103 mmol/L (ref 98–111)
Creatinine, Ser: 1.25 mg/dL — ABNORMAL HIGH (ref 0.44–1.00)
GFR, Estimated: 44 mL/min — ABNORMAL LOW (ref >60)
Glucose, Bld: 180 mg/dL — ABNORMAL HIGH (ref 70–99)
Potassium: 3.3 mmol/L — ABNORMAL LOW (ref 3.5–5.1)
Sodium: 132 mmol/L — ABNORMAL LOW (ref 135–145)

## 2022-08-31 LAB — URINALYSIS, MICROSCOPIC (REFLEX)

## 2022-08-31 LAB — TROPONIN I (HIGH SENSITIVITY)
Troponin I (High Sensitivity): 11 ng/L (ref <18)
Troponin I (High Sensitivity): 10 ng/L (ref <18)

## 2022-08-31 LAB — CBC WITH DIFFERENTIAL/PLATELET
Abs Immature Granulocytes: 0.3 10*3/uL — ABNORMAL HIGH (ref 0.00–0.07)
Basophils Absolute: 0.1 10*3/uL (ref 0.0–0.1)
Basophils Relative: 0 %
Eosinophils Absolute: 0.3 10*3/uL (ref 0.0–0.5)
Eosinophils Relative: 3 %
HCT: 30.5 % — ABNORMAL LOW (ref 36.0–46.0)
Hemoglobin: 10 g/dL — ABNORMAL LOW (ref 12.0–15.0)
Immature Granulocytes: 3 %
Lymphocytes Relative: 21 %
Lymphs Abs: 2.3 10*3/uL (ref 0.7–4.0)
MCH: 28.8 pg (ref 26.0–34.0)
MCHC: 32.8 g/dL (ref 30.0–36.0)
MCV: 87.9 fL (ref 80.0–100.0)
Monocytes Absolute: 0.9 10*3/uL (ref 0.1–1.0)
Monocytes Relative: 8 %
Neutro Abs: 7.4 10*3/uL (ref 1.7–7.7)
Neutrophils Relative %: 65 %
Platelets: 289 10*3/uL (ref 150–400)
RBC: 3.47 MIL/uL — ABNORMAL LOW (ref 3.87–5.11)
RDW: 14.3 % (ref 11.5–15.5)
WBC: 11.3 10*3/uL — ABNORMAL HIGH (ref 4.0–10.5)
nRBC: 0.2 % (ref 0.0–0.2)

## 2022-08-31 LAB — HEPATIC FUNCTION PANEL
ALT: 14 U/L (ref 0–44)
AST: 25 U/L (ref 15–41)
Albumin: 3.3 g/dL — ABNORMAL LOW (ref 3.5–5.0)
Alkaline Phosphatase: 63 U/L (ref 38–126)
Bilirubin, Direct: 0.4 mg/dL — ABNORMAL HIGH (ref 0.0–0.2)
Indirect Bilirubin: 2.7 mg/dL — ABNORMAL HIGH (ref 0.3–0.9)
Total Bilirubin: 3.1 mg/dL — ABNORMAL HIGH (ref 0.3–1.2)
Total Protein: 6.9 g/dL (ref 6.5–8.1)

## 2022-08-31 LAB — URINALYSIS, ROUTINE W REFLEX MICROSCOPIC
Glucose, UA: 100 mg/dL — AB
Ketones, ur: NEGATIVE mg/dL
Nitrite: POSITIVE — AB
Protein, ur: 30 mg/dL — AB
Specific Gravity, Urine: >=1.03 (ref 1.005–1.030)
pH: 6 (ref 5.0–8.0)

## 2022-08-31 MED ORDER — SODIUM CHLORIDE 0.9 % IV SOLN
1.0000 g | Freq: Once | INTRAVENOUS | Status: AC
  Administered 2022-08-31: 1 g via INTRAVENOUS
  Filled 2022-08-31: qty 10

## 2022-08-31 MED ORDER — CEPHALEXIN 500 MG PO CAPS: 500.0000 mg | ORAL_CAPSULE | Freq: Three times a day (TID) | ORAL | 0 refills | Status: AC

## 2022-08-31 MED ORDER — SODIUM CHLORIDE 0.9 % IV BOLUS
1000.0000 mL | Freq: Once | INTRAVENOUS | Status: AC
  Administered 2022-08-31: 1000 mL via INTRAVENOUS

## 2022-08-31 NOTE — ED Notes (Signed)
Attempted IV start x 2, good flash back and blood draw, but veins blew with flush

## 2022-08-31 NOTE — Discharge Instructions (Signed)
You have UTI likely causing your confusion.   Please stay hydrated.  You were given IV antibiotics in the ER.  Please take Keflex 3 times daily for a week for UTI  See your doctor for follow-up  Return to ER if you have fever, vomiting, worse confusion, worse abdominal pain

## 2022-08-31 NOTE — ED Triage Notes (Signed)
Pt arrives with exertional SOB that started around Sunday. Pt c/o generalized weakness. Pt denies CP. Per family member, pt had pain in her right upper leg and groin this week. Pt has hx of PE. Family also concerned because pt is confused and not "herself the past couple of days." Pt a&ox3.

## 2022-08-31 NOTE — ED Provider Notes (Signed)
[facility] Provider Note   CSN: [id number] Arrival date & time: 08/31/22  1806     History  Chief Complaint  Patient presents with   Shortness of Hart is a 82 y.o. female history of dementia, A-fib on Eliquis, here presenting with shortness of breath and right leg pain and abdominal pain.  Patient had right leg pain about 5 days ago.  Patient states that leg pain has resolved but then she developed right upper quadrant pain 3 days ago.  Patient gets daily physical therapy and daughter picked her up and noticed that she was too weak.  She also appears confused as well.  Patient states that she feels fine.  Denies any fevers or chills or cough.  Patient is compliant with her Eliquis.  The history is provided by the patient.       Home Medications Prior to Admission medications   Medication Sig Start Date End Date Taking? Authorizing Provider  Accu-Chek FastClix Lancets MISC  03/24/20   [provider]  ACCU-CHEK SMARTVIEW test strip  03/24/20   [provider]  amLODipine-valsartan (EXFORGE) 5-320 MG tablet Take 1 tablet by mouth daily. 02/06/20   [provider]  apixaban (ELIQUIS) 5 MG TABS tablet Please take 10 mg oral twice daily for next 5 days, then transition to 5 mg oral twice daily on 01/04/2020 Patient taking differently: Take 5 mg by mouth 2 (two) times daily. 12/30/19   Elodia Florence., MD  chlorthalidone (HYGROTON) 25 MG tablet Take 12.5 mg by mouth daily. 11/30/19   [provider]  colestipol (COLESTID) 1 g tablet Take 1 tablet (1 g total) by mouth 2 (two) times daily. Patient not taking: Reported on 12/20/2020 07/22/20   Yetta Flock, MD  dapagliflozin propanediol (FARXIGA) 10 MG TABS tablet Take 10 mg by mouth daily.    [provider]  dicyclomine (BENTYL) 10 MG capsule Take 1 capsule (10 mg total) by mouth every 8 (eight) hours as needed for  spasms. Patient not taking: No sig reported 07/22/20   Yetta Flock, MD  donepezil (ARICEPT) 10 MG tablet Take 10 mg by mouth at bedtime.    [provider]  escitalopram (LEXAPRO) 10 MG tablet Take 5 mg by mouth at bedtime. 11/04/19   [provider]  pantoprazole (PROTONIX) 40 MG tablet Take 1 tablet (40 mg total) by mouth daily. 12/26/19   Elgergawy, Silver Huguenin, MD  Peppermint Oil (IBGARD) 90 MG CPCR Use as directed 07/22/20   Armbruster, Carlota Raspberry, MD  pioglitazone (ACTOS) 30 MG tablet Take 30 mg by mouth daily.    [provider]  rosuvastatin (CRESTOR) 20 MG tablet Take 20 mg by mouth 3 (three) times a week. 11/03/19   [provider]  vitamin B-12 100 MCG tablet Take 1 tablet (100 mcg total) by mouth daily. 12/27/19   Elgergawy, Silver Huguenin, MD      Allergies    Predicort [prednisolone], Prednisone, and Penicillins    Review of Systems   Review of Systems  Musculoskeletal:        Leg pain  Psychiatric/Behavioral:  Positive for confusion.   All other systems reviewed and are negative.   Physical Exam Updated Vital Signs BP (!) 116/95 (BP Location: Left Arm)   Pulse (!) 101   Temp 98.8 F (37.1 C) (Oral)   Resp 17   Wt 95.3 kg   SpO2 100%  BMI 37.20 kg/m  Physical Exam Vitals and nursing note reviewed.  Constitutional:      Comments: Chronically ill but well-appearing  HENT:     Head: Normocephalic.  Eyes:     Extraocular Movements: Extraocular movements intact.     Pupils: Pupils are equal, round, and reactive to light.  Cardiovascular:     Rate and Rhythm: Normal rate and regular rhythm.  Pulmonary:     Effort: Pulmonary effort is normal.     Breath sounds: Normal breath sounds.  Abdominal:     Palpations: Abdomen is soft.     Comments: Mild right upper quadrant tenderness  Musculoskeletal:     Cervical back: Normal range of motion and neck supple.     Comments: Mild right thigh tenderness.  No obvious erythema or signs of  infection.  Compartments are soft  Skin:    General: Skin is warm.     Capillary Refill: Capillary refill takes less than 2 seconds.  Neurological:     General: No focal deficit present.     Comments: Demented which is baseline  Psychiatric:        Mood and Affect: Mood normal.        Behavior: Behavior normal.     ED Results / Procedures / Treatments   Labs (all labs ordered are listed, but only abnormal results are displayed) Labs Reviewed  CBC WITH DIFFERENTIAL/PLATELET - Abnormal; Notable for the following components:      Result Value   WBC 11.3 (*)    RBC 3.47 (*)    Hemoglobin 10.0 (*)    HCT 30.5 (*)    Abs Immature Granulocytes 0.30 (*)    All other components within normal limits  BASIC METABOLIC PANEL - Abnormal; Notable for the following components:   Sodium 132 (*)    Potassium 3.3 (*)    Glucose, Bld 180 (*)    BUN 33 (*)    Creatinine, Ser 1.25 (*)    GFR, Estimated 44 (*)    All other components within normal limits  URINALYSIS, ROUTINE W REFLEX MICROSCOPIC  HEPATIC FUNCTION PANEL  TROPONIN I (HIGH SENSITIVITY)  TROPONIN I (HIGH SENSITIVITY)    EKG EKG Interpretation  Date/Time:  Thursday August 31 2022 18:22:13 EST Ventricular Rate:  97 PR Interval:  164 QRS Duration: 122 QT Interval:  386 QTC Calculation: 491 R Axis:   210 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 RADIATION DOSE REDUCTION: This exam was performed according to the departmental dose-optimization program which includes automated exposure control, adjustment of the mA and/or kV according to patient size and/or use of iterative  reconstruction technique. COMPARISON:  10/09/2019 FINDINGS: Brain: No evidence of acute infarction, hemorrhage, hydrocephalus, extra-axial collection or mass lesion/mass effect. Subcortical white matter and periventricular small vessel ischemic changes. Vascular: Mild intracranial atherosclerosis. Skull: Normal. Negative for fracture or focal lesion. Sinuses/Orbits: The visualized paranasal sinuses are essentially clear. The mastoid air cells are unopacified. Other: None. IMPRESSION: No evidence of acute intracranial abnormality. Small vessel ischemic changes. Electronically Signed   By: Julian Hy M.D.   On: 08/31/2022 18:47   DG Chest 2 View  Result Date: 08/31/2022 CLINICAL  DATA:  Shortness of breath EXAM: CHEST - 2 VIEW COMPARISON:  None Available. FINDINGS: Lungs are clear.  No pleural effusion or pneumothorax. The heart is normal in size. Degenerative changes of the visualized thoracolumbar spine. IMPRESSION: Normal chest radiographs. Electronically Signed   By: Julian Hy M.D.   On: 08/31/2022 18:44    Procedures Procedures    Medications Ordered in ED Medications  sodium chloride 0.9 % bolus 1,000 mL (has no administration in time range)    ED Course/ Medical Decision Making/ A&P                             Medical Decision Making CHARLANN BUITRAGO is a 81 y.o. female here presenting with altered mental status and confusion and leg pain and right upper quadrant pain.  Differential is large.  Consider head bleed versus pneumonia versus COVID versus biliary colic versus DVT versus UTI versus electrolyte abnormality.  Plan to get CBC and CMP and chest x-ray and urinalysis and CT head.  Will also get right upper quadrant ultrasound and right leg ultrasound.  11:18 PM Labs show white blood cell count 11.  Right upper quadrant ultrasound showed cholelithiasis with no cholecystitis.  Right leg ultrasound did not show any DVT.  UA showed leukocytes and nitrate and many bacteria.   I think she has UTI causing her symptoms.  Patient is afebrile and would like to go home.  She was given Rocephin in the ED.  Patient will be discharged home with Keflex.  Patient has family to take care of her.  She also has home PT.  Problems Addressed: Acute cystitis without hematuria: acute illness or injury Confusion: acute illness or injury  Amount and/or Complexity of Data Reviewed Labs: ordered. Decision-making details documented in ED Course. Radiology: ordered and independent interpretation performed. Decision-making details documented in ED Course.    Final Clinical Impression(s) / ED Diagnoses Final diagnoses:  None    Rx / DC Orders ED Discharge Orders     None         Drenda Freeze, MD 08/31/22 2320

## 2022-08-31 NOTE — ED Notes (Signed)
US at bedside

## 2022-08-31 NOTE — ED Notes (Signed)
Pt used bedside commode, urine had an old piece of toilet paper in it, sample unusable, will retry later

## 2022-09-12 DIAGNOSIS — E119 Type 2 diabetes mellitus without complications: Secondary | ICD-10-CM | POA: Diagnosis not present

## 2022-10-27 DIAGNOSIS — G4700 Insomnia, unspecified: Secondary | ICD-10-CM | POA: Diagnosis not present

## 2022-10-27 DIAGNOSIS — I2699 Other pulmonary embolism without acute cor pulmonale: Secondary | ICD-10-CM | POA: Diagnosis not present

## 2022-10-27 DIAGNOSIS — N8500 Endometrial hyperplasia, unspecified: Secondary | ICD-10-CM | POA: Diagnosis not present

## 2022-10-27 DIAGNOSIS — R5381 Other malaise: Secondary | ICD-10-CM | POA: Diagnosis not present

## 2022-10-27 DIAGNOSIS — F32A Depression, unspecified: Secondary | ICD-10-CM | POA: Diagnosis not present

## 2022-10-27 DIAGNOSIS — E119 Type 2 diabetes mellitus without complications: Secondary | ICD-10-CM | POA: Diagnosis not present

## 2022-10-27 DIAGNOSIS — R413 Other amnesia: Secondary | ICD-10-CM | POA: Diagnosis not present

## 2022-10-31 DIAGNOSIS — G319 Degenerative disease of nervous system, unspecified: Secondary | ICD-10-CM | POA: Diagnosis not present

## 2022-10-31 DIAGNOSIS — R4181 Age-related cognitive decline: Secondary | ICD-10-CM | POA: Diagnosis not present

## 2023-02-02 DIAGNOSIS — R0600 Dyspnea, unspecified: Secondary | ICD-10-CM | POA: Diagnosis not present

## 2023-02-02 DIAGNOSIS — R413 Other amnesia: Secondary | ICD-10-CM | POA: Diagnosis not present

## 2023-02-02 DIAGNOSIS — I2699 Other pulmonary embolism without acute cor pulmonale: Secondary | ICD-10-CM | POA: Diagnosis not present

## 2023-02-02 DIAGNOSIS — R195 Other fecal abnormalities: Secondary | ICD-10-CM | POA: Diagnosis not present

## 2023-02-02 DIAGNOSIS — N8500 Endometrial hyperplasia, unspecified: Secondary | ICD-10-CM | POA: Diagnosis not present

## 2023-02-02 DIAGNOSIS — E119 Type 2 diabetes mellitus without complications: Secondary | ICD-10-CM | POA: Diagnosis not present

## 2023-02-06 DIAGNOSIS — E119 Type 2 diabetes mellitus without complications: Secondary | ICD-10-CM | POA: Diagnosis not present

## 2023-02-06 DIAGNOSIS — I2699 Other pulmonary embolism without acute cor pulmonale: Secondary | ICD-10-CM | POA: Diagnosis not present

## 2023-02-06 DIAGNOSIS — R413 Other amnesia: Secondary | ICD-10-CM | POA: Diagnosis not present

## 2024-05-26 ENCOUNTER — Ambulatory Visit (HOSPITAL_BASED_OUTPATIENT_CLINIC_OR_DEPARTMENT_OTHER): Payer: Self-pay
